# Patient Record
Sex: Female | Born: 1935 | Race: White | Hispanic: No | Marital: Single | Smoking: Former smoker
Health system: Southern US, Community
[De-identification: ages and names within clinical notes are randomized; demographics above are authoritative.]

## PROBLEM LIST (undated history)

## (undated) DIAGNOSIS — I10 Essential (primary) hypertension: Secondary | ICD-10-CM

## (undated) DIAGNOSIS — M25552 Pain in left hip: Secondary | ICD-10-CM

## (undated) DIAGNOSIS — M1612 Unilateral primary osteoarthritis, left hip: Secondary | ICD-10-CM

## (undated) DIAGNOSIS — K219 Gastro-esophageal reflux disease without esophagitis: Secondary | ICD-10-CM

## (undated) DIAGNOSIS — M545 Low back pain, unspecified: Secondary | ICD-10-CM

## (undated) DIAGNOSIS — Z9289 Personal history of other medical treatment: Secondary | ICD-10-CM

## (undated) DIAGNOSIS — T84059A Periprosthetic osteolysis of unspecified internal prosthetic joint, initial encounter: Secondary | ICD-10-CM

## (undated) DIAGNOSIS — M1990 Unspecified osteoarthritis, unspecified site: Secondary | ICD-10-CM

## (undated) DIAGNOSIS — M7061 Trochanteric bursitis, right hip: Secondary | ICD-10-CM

## (undated) DIAGNOSIS — S7002XA Contusion of left hip, initial encounter: Secondary | ICD-10-CM

## (undated) DIAGNOSIS — Z4789 Encounter for other orthopedic aftercare: Secondary | ICD-10-CM

## (undated) DIAGNOSIS — Z96643 Presence of artificial hip joint, bilateral: Secondary | ICD-10-CM

## (undated) HISTORY — DX: Trochanteric bursitis, right hip: M70.61

## (undated) HISTORY — PX: JOINT REPLACEMENT: SHX530

## (undated) HISTORY — DX: Presence of artificial hip joint, bilateral: Z96.643

## (undated) HISTORY — DX: Pain in left hip: M25.552

## (undated) HISTORY — DX: Low back pain, unspecified: M54.50

## (undated) HISTORY — DX: Low back pain: M54.5

## (undated) HISTORY — PX: ABDOMINAL HYSTERECTOMY: SHX81

## (undated) HISTORY — DX: Unilateral primary osteoarthritis, left hip: M16.12

## (undated) HISTORY — DX: Encounter for other orthopedic aftercare: Z47.89

## (undated) HISTORY — DX: Contusion of left hip, initial encounter: S70.02XA

## (undated) HISTORY — DX: Periprosthetic osteolysis of unspecified internal prosthetic joint, initial encounter: T84.059A

---

## 1975-08-23 HISTORY — PX: OTHER SURGICAL HISTORY: SHX169

## 2006-08-22 HISTORY — PX: OTHER SURGICAL HISTORY: SHX169

## 2012-08-27 DIAGNOSIS — R300 Dysuria: Secondary | ICD-10-CM | POA: Diagnosis not present

## 2012-08-27 DIAGNOSIS — N390 Urinary tract infection, site not specified: Secondary | ICD-10-CM | POA: Diagnosis not present

## 2012-08-28 DIAGNOSIS — M6281 Muscle weakness (generalized): Secondary | ICD-10-CM | POA: Diagnosis not present

## 2012-08-28 DIAGNOSIS — I495 Sick sinus syndrome: Secondary | ICD-10-CM | POA: Diagnosis not present

## 2012-08-28 DIAGNOSIS — N390 Urinary tract infection, site not specified: Secondary | ICD-10-CM | POA: Diagnosis not present

## 2012-08-28 DIAGNOSIS — R5383 Other fatigue: Secondary | ICD-10-CM | POA: Diagnosis not present

## 2012-08-28 DIAGNOSIS — K5900 Constipation, unspecified: Secondary | ICD-10-CM | POA: Diagnosis not present

## 2012-08-28 DIAGNOSIS — Z96649 Presence of unspecified artificial hip joint: Secondary | ICD-10-CM | POA: Diagnosis not present

## 2012-08-28 DIAGNOSIS — R55 Syncope and collapse: Secondary | ICD-10-CM | POA: Diagnosis not present

## 2012-08-28 DIAGNOSIS — Z96659 Presence of unspecified artificial knee joint: Secondary | ICD-10-CM | POA: Diagnosis not present

## 2012-08-28 DIAGNOSIS — R5381 Other malaise: Secondary | ICD-10-CM | POA: Diagnosis not present

## 2012-08-28 DIAGNOSIS — N3000 Acute cystitis without hematuria: Secondary | ICD-10-CM | POA: Diagnosis not present

## 2012-08-28 DIAGNOSIS — R51 Headache: Secondary | ICD-10-CM | POA: Diagnosis not present

## 2012-08-28 DIAGNOSIS — Z9181 History of falling: Secondary | ICD-10-CM | POA: Diagnosis not present

## 2012-08-28 DIAGNOSIS — M549 Dorsalgia, unspecified: Secondary | ICD-10-CM | POA: Diagnosis not present

## 2012-08-28 DIAGNOSIS — Z87891 Personal history of nicotine dependence: Secondary | ICD-10-CM | POA: Diagnosis not present

## 2012-08-28 DIAGNOSIS — J392 Other diseases of pharynx: Secondary | ICD-10-CM | POA: Diagnosis not present

## 2012-08-29 DIAGNOSIS — Z87891 Personal history of nicotine dependence: Secondary | ICD-10-CM | POA: Diagnosis not present

## 2012-08-29 DIAGNOSIS — Z96659 Presence of unspecified artificial knee joint: Secondary | ICD-10-CM | POA: Diagnosis not present

## 2012-08-29 DIAGNOSIS — Z9181 History of falling: Secondary | ICD-10-CM | POA: Diagnosis not present

## 2012-08-29 DIAGNOSIS — N3000 Acute cystitis without hematuria: Secondary | ICD-10-CM | POA: Diagnosis not present

## 2012-08-29 DIAGNOSIS — R5383 Other fatigue: Secondary | ICD-10-CM | POA: Diagnosis not present

## 2012-08-29 DIAGNOSIS — K5900 Constipation, unspecified: Secondary | ICD-10-CM | POA: Diagnosis not present

## 2012-09-10 DIAGNOSIS — R350 Frequency of micturition: Secondary | ICD-10-CM | POA: Diagnosis not present

## 2012-09-10 DIAGNOSIS — N3000 Acute cystitis without hematuria: Secondary | ICD-10-CM | POA: Diagnosis not present

## 2012-12-13 DIAGNOSIS — Z79899 Other long term (current) drug therapy: Secondary | ICD-10-CM | POA: Diagnosis not present

## 2012-12-13 DIAGNOSIS — R1032 Left lower quadrant pain: Secondary | ICD-10-CM | POA: Diagnosis not present

## 2012-12-13 DIAGNOSIS — M545 Low back pain: Secondary | ICD-10-CM | POA: Diagnosis not present

## 2012-12-13 DIAGNOSIS — M1710 Unilateral primary osteoarthritis, unspecified knee: Secondary | ICD-10-CM | POA: Diagnosis not present

## 2012-12-13 DIAGNOSIS — R42 Dizziness and giddiness: Secondary | ICD-10-CM | POA: Diagnosis not present

## 2013-02-12 DIAGNOSIS — M545 Low back pain: Secondary | ICD-10-CM | POA: Diagnosis not present

## 2013-02-12 DIAGNOSIS — M999 Biomechanical lesion, unspecified: Secondary | ICD-10-CM | POA: Diagnosis not present

## 2013-02-13 DIAGNOSIS — M999 Biomechanical lesion, unspecified: Secondary | ICD-10-CM | POA: Diagnosis not present

## 2013-02-13 DIAGNOSIS — M545 Low back pain: Secondary | ICD-10-CM | POA: Diagnosis not present

## 2013-02-18 DIAGNOSIS — M545 Low back pain: Secondary | ICD-10-CM | POA: Diagnosis not present

## 2013-02-18 DIAGNOSIS — M999 Biomechanical lesion, unspecified: Secondary | ICD-10-CM | POA: Diagnosis not present

## 2013-02-25 DIAGNOSIS — M545 Low back pain: Secondary | ICD-10-CM | POA: Diagnosis not present

## 2013-02-25 DIAGNOSIS — M999 Biomechanical lesion, unspecified: Secondary | ICD-10-CM | POA: Diagnosis not present

## 2013-02-27 DIAGNOSIS — M999 Biomechanical lesion, unspecified: Secondary | ICD-10-CM | POA: Diagnosis not present

## 2013-02-27 DIAGNOSIS — M545 Low back pain: Secondary | ICD-10-CM | POA: Diagnosis not present

## 2013-03-06 DIAGNOSIS — M545 Low back pain: Secondary | ICD-10-CM | POA: Diagnosis not present

## 2013-03-06 DIAGNOSIS — M999 Biomechanical lesion, unspecified: Secondary | ICD-10-CM | POA: Diagnosis not present

## 2013-03-08 DIAGNOSIS — M1610 Unilateral primary osteoarthritis, unspecified hip: Secondary | ICD-10-CM | POA: Diagnosis not present

## 2013-03-25 DIAGNOSIS — M169 Osteoarthritis of hip, unspecified: Secondary | ICD-10-CM | POA: Diagnosis not present

## 2013-03-25 DIAGNOSIS — M1710 Unilateral primary osteoarthritis, unspecified knee: Secondary | ICD-10-CM | POA: Diagnosis not present

## 2013-03-25 DIAGNOSIS — Z01812 Encounter for preprocedural laboratory examination: Secondary | ICD-10-CM | POA: Diagnosis not present

## 2013-04-01 DIAGNOSIS — R1909 Other intra-abdominal and pelvic swelling, mass and lump: Secondary | ICD-10-CM | POA: Diagnosis not present

## 2013-04-01 DIAGNOSIS — M899 Disorder of bone, unspecified: Secondary | ICD-10-CM | POA: Diagnosis not present

## 2013-04-01 DIAGNOSIS — M949 Disorder of cartilage, unspecified: Secondary | ICD-10-CM | POA: Diagnosis not present

## 2013-04-01 DIAGNOSIS — M169 Osteoarthritis of hip, unspecified: Secondary | ICD-10-CM | POA: Diagnosis not present

## 2013-04-01 DIAGNOSIS — Z96649 Presence of unspecified artificial hip joint: Secondary | ICD-10-CM | POA: Diagnosis not present

## 2013-04-04 DIAGNOSIS — M1610 Unilateral primary osteoarthritis, unspecified hip: Secondary | ICD-10-CM | POA: Diagnosis not present

## 2013-04-04 DIAGNOSIS — M8560 Other cyst of bone, unspecified site: Secondary | ICD-10-CM | POA: Diagnosis not present

## 2013-04-04 DIAGNOSIS — T84099A Other mechanical complication of unspecified internal joint prosthesis, initial encounter: Secondary | ICD-10-CM | POA: Diagnosis not present

## 2013-04-25 DIAGNOSIS — M1710 Unilateral primary osteoarthritis, unspecified knee: Secondary | ICD-10-CM | POA: Diagnosis not present

## 2013-04-30 DIAGNOSIS — H6120 Impacted cerumen, unspecified ear: Secondary | ICD-10-CM | POA: Diagnosis not present

## 2013-04-30 DIAGNOSIS — M1710 Unilateral primary osteoarthritis, unspecified knee: Secondary | ICD-10-CM | POA: Diagnosis not present

## 2013-04-30 DIAGNOSIS — J3089 Other allergic rhinitis: Secondary | ICD-10-CM | POA: Diagnosis not present

## 2013-05-24 ENCOUNTER — Encounter (HOSPITAL_COMMUNITY): Payer: Self-pay | Admitting: Pharmacy Technician

## 2013-05-29 ENCOUNTER — Encounter (HOSPITAL_COMMUNITY): Payer: Self-pay

## 2013-05-29 ENCOUNTER — Encounter (HOSPITAL_COMMUNITY)
Admission: RE | Admit: 2013-05-29 | Discharge: 2013-05-29 | Disposition: A | Discharge status: Home / Self Care | Payer: Medicare Other | Source: Ambulatory Visit | Attending: Orthopedic Surgery | Admitting: Orthopedic Surgery

## 2013-05-29 ENCOUNTER — Other Ambulatory Visit (HOSPITAL_COMMUNITY): Payer: Self-pay | Admitting: Orthopedic Surgery

## 2013-05-29 DIAGNOSIS — Z01812 Encounter for preprocedural laboratory examination: Secondary | ICD-10-CM | POA: Diagnosis not present

## 2013-05-29 HISTORY — DX: Unspecified osteoarthritis, unspecified site: M19.90

## 2013-05-29 HISTORY — DX: Gastro-esophageal reflux disease without esophagitis: K21.9

## 2013-05-29 HISTORY — DX: Personal history of other medical treatment: Z92.89

## 2013-05-29 LAB — URINE MICROSCOPIC-ADD ON

## 2013-05-29 LAB — CBC
HCT: 36.6 % (ref 36.0–46.0)
MCHC: 33.1 g/dL (ref 30.0–36.0)
Platelets: 289 10*3/uL (ref 150–400)
RDW: 13.7 % (ref 11.5–15.5)
WBC: 6.7 10*3/uL (ref 4.0–10.5)

## 2013-05-29 LAB — PROTIME-INR: INR: 0.97 (ref 0.00–1.49)

## 2013-05-29 LAB — URINALYSIS, ROUTINE W REFLEX MICROSCOPIC
Glucose, UA: NEGATIVE mg/dL
Hgb urine dipstick: NEGATIVE
Ketones, ur: NEGATIVE mg/dL
Protein, ur: NEGATIVE mg/dL
pH: 5 (ref 5.0–8.0)

## 2013-05-29 LAB — BASIC METABOLIC PANEL
BUN: 20 mg/dL (ref 6–23)
Calcium: 9.2 mg/dL (ref 8.4–10.5)
Creatinine, Ser: 0.74 mg/dL (ref 0.50–1.10)
GFR calc Af Amer: 87 mL/min — ABNORMAL LOW (ref >90)

## 2013-05-29 LAB — ABO/RH: ABO/RH(D): B POS

## 2013-05-29 LAB — SURGICAL PCR SCREEN: MRSA, PCR: NEGATIVE

## 2013-05-29 LAB — APTT: aPTT: 29 seconds (ref 24–37)

## 2013-05-29 NOTE — Progress Notes (Addendum)
Medical clearance note dr Lanora Manis dewey on chart

## 2013-05-29 NOTE — Patient Instructions (Addendum)
20 Marguerita Stapp  05/29/2013   Your procedure is scheduled on: 06-04-2013  Report to [facility] at 1050  AM.  Call this number if you have problems the morning of surgery [phone]   Remember:   Do not eat food  :After Midnight.              Clear liquids midnight until 750 am day of surgery, then nothing by mouth.                  Take these medicines the morning of surgery with A SIP OF WATER: no meds to take                                SEE [facility] PREPARING FOR SURGERY SHEET             You may not have any metal on your body including hair pins and piercings  Do not wear jewelry, make-up.  Do not wear lotions, powders, or perfumes. You may wear deodorant.   Men may shave face and neck.  Do not bring valuables to the hospital.  IS NOT RESPONSIBLE FOR VALUEABLES.  Contacts, dentures or bridgework may not be worn into surgery.  Leave suitcase in the car. After surgery it may be brought to your room.  For patients admitted to the hospital, checkout time is 11:00 AM the day of discharge.   Patients discharged the day of surgery will not be allowed to drive home.  Name and phone number of your driver:  Special Instructions: N/A   Please read over the following fact sheets that you were given: mrsa information, blood fact sheet, incentive spirometer fact sheet  Call Cain Sieve RN pre op nurse if needed [phone]    FAILURE TO FOLLOW THESE INSTRUCTIONS MAY RESULT IN THE CANCELLATION OF YOUR SURGERY.  PATIENT SIGNATURE___________________________________________  NURSE SIGNATURE_____________________________________________

## 2013-06-01 NOTE — H&P (Signed)
TOTAL HIP ADMISSION H&P  Patient is admitted for left total hip arthroplasty, anterior approach.  Subjective:  Chief Complaint:   Left hip OA / pain  HPI: Laura Knight, 79 y.o. female, has a history of pain and functional disability in the left hip(s) due to arthritis and patient has failed non-surgical conservative treatments for greater than 12 weeks to include NSAID's and/or analgesics, use of assistive devices and activity modification.  Onset of symptoms was gradual starting 2+ years ago with gradually worsening course since that time.The patient noted prior procedures of the hip to include arthroplasty on the right hip(s).  Patient currently rates pain in the left hip at 8 out of 10 with activity. Patient has night pain, worsening of pain with activity and weight bearing, trendelenberg gait, pain that interfers with activities of daily living and pain with passive range of motion. Patient has evidence of periarticular osteophytes and joint space narrowing by imaging studies. This condition presents safety issues increasing the risk of falls.  There is no current active infection.  Risks, benefits and expectations were discussed with the patient. Patient understand the risks, benefits and expectations and wishes to proceed with surgery.   D/C Plans:   SNF ([facility])  Post-op Meds:   No Rx given  Tranexamic Acid:   To be given  Decadron:    To be given  FYI:    ASA post-op   Past Medical History  Diagnosis Date  . GERD (gastroesophageal reflux disease)   . Arthritis   . History of blood transfusion yrs ago    Past Surgical History  Procedure Laterality Date  . Abdominal hysterectomy      1 ovary removed  . Joint replacement    . Right hip replacement  1977  . Bilateral knee replacment  2008    No prescriptions prior to admission   No Known Allergies   History  Substance Use Topics  . Smoking status: Former Smoker -- 0.50 packs/day for 12 years    Types: Cigarettes  .  Smokeless tobacco: Never Used     Comment: quit smoking age 75  . Alcohol Use: Yes     Comment: occasional wine    No family history on file.   Review of Systems  Constitutional: Negative.   HENT: Negative.   Eyes: Negative.   Respiratory: Positive for cough.   Cardiovascular: Negative.   Gastrointestinal: Positive for heartburn.  Genitourinary: Negative.   Musculoskeletal: Positive for back pain and joint pain.  Skin: Negative.   Neurological: Negative.   Endo/Heme/Allergies: Positive for environmental allergies.  Psychiatric/Behavioral: Negative.     Objective:  Physical Exam  Constitutional: She is oriented to person, place, and time. She appears well-developed and well-nourished.  HENT:  Head: Normocephalic and atraumatic.  Mouth/Throat: Oropharynx is clear and moist.  Eyes: Pupils are equal, round, and reactive to light.  Neck: Neck supple. No JVD present. No tracheal deviation present. No thyromegaly present.  Cardiovascular: Normal rate, regular rhythm, normal heart sounds and intact distal pulses.   Respiratory: Effort normal and breath sounds normal. No stridor. No respiratory distress. She has no wheezes.  GI: Soft. There is no tenderness. There is no guarding.  Musculoskeletal:       Left hip: She exhibits decreased range of motion, decreased strength, tenderness and bony tenderness. She exhibits no swelling, no deformity and no laceration.  Lymphadenopathy:    She has no cervical adenopathy.  Neurological: She is alert and oriented to person, place, and  time.  Skin: Skin is warm and dry.  Psychiatric: She has a normal mood and affect.    Imaging Review Plain radiographs demonstrate severe degenerative joint disease of the left hip(s). The bone quality appears to be good for age and reported activity level.  Assessment/Plan:  End stage arthritis, left hip(s)  The patient history, physical examination, clinical judgement of the provider and imaging studies  are consistent with end stage degenerative joint disease of the left hip(s) and total hip arthroplasty is deemed medically necessary. The treatment options including medical management, injection therapy, arthroscopy and arthroplasty were discussed at length. The risks and benefits of total hip arthroplasty were presented and reviewed. The risks due to aseptic loosening, infection, stiffness, dislocation/subluxation,  thromboembolic complications and other imponderables were discussed.  The patient acknowledged the explanation, agreed to proceed with the plan and consent was signed. Patient is being admitted for inpatient treatment for surgery, pain control, PT, OT, prophylactic antibiotics, VTE prophylaxis, progressive ambulation and ADL's and discharge planning.The patient is planning to be discharged to skilled nursing facility.      Anastasio Auerbach Ketan Renz   PAC  06/01/2013, 12:54 PM

## 2013-06-03 NOTE — Progress Notes (Signed)
SPOKE WITH GARY Hughson PT SON BY PHONE, MADE AWARE SURGERY TIME CHANGED TO 1225 P,, ARRIVE 925 AM, NPO AFTER MIDNIGHT.

## 2013-06-04 ENCOUNTER — Inpatient Hospital Stay (HOSPITAL_COMMUNITY)
Admission: RE | Admit: 2013-06-04 | Discharge: 2013-06-07 | DRG: 470 | Disposition: A | Discharge status: Skilled Nursing Facility | Payer: Medicare Other | Source: Ambulatory Visit | Attending: Orthopedic Surgery | Admitting: Orthopedic Surgery

## 2013-06-04 ENCOUNTER — Inpatient Hospital Stay (HOSPITAL_COMMUNITY): Payer: Medicare Other

## 2013-06-04 ENCOUNTER — Inpatient Hospital Stay (HOSPITAL_COMMUNITY): Payer: Medicare Other | Admitting: Anesthesiology

## 2013-06-04 ENCOUNTER — Encounter (HOSPITAL_COMMUNITY)
Admission: RE | Disposition: A | Discharge status: Skilled Nursing Facility | Payer: Self-pay | Source: Ambulatory Visit | Attending: Orthopedic Surgery

## 2013-06-04 ENCOUNTER — Encounter (HOSPITAL_COMMUNITY): Payer: Self-pay | Admitting: *Deleted

## 2013-06-04 ENCOUNTER — Encounter (HOSPITAL_COMMUNITY): Payer: Medicare Other | Admitting: Anesthesiology

## 2013-06-04 DIAGNOSIS — Z87891 Personal history of nicotine dependence: Secondary | ICD-10-CM

## 2013-06-04 DIAGNOSIS — M6281 Muscle weakness (generalized): Secondary | ICD-10-CM | POA: Diagnosis not present

## 2013-06-04 DIAGNOSIS — R279 Unspecified lack of coordination: Secondary | ICD-10-CM | POA: Diagnosis not present

## 2013-06-04 DIAGNOSIS — K219 Gastro-esophageal reflux disease without esophagitis: Secondary | ICD-10-CM | POA: Diagnosis not present

## 2013-06-04 DIAGNOSIS — M1610 Unilateral primary osteoarthritis, unspecified hip: Principal | ICD-10-CM | POA: Diagnosis present

## 2013-06-04 DIAGNOSIS — Z471 Aftercare following joint replacement surgery: Secondary | ICD-10-CM | POA: Diagnosis not present

## 2013-06-04 DIAGNOSIS — M169 Osteoarthritis of hip, unspecified: Secondary | ICD-10-CM | POA: Diagnosis present

## 2013-06-04 DIAGNOSIS — Z96659 Presence of unspecified artificial knee joint: Secondary | ICD-10-CM

## 2013-06-04 DIAGNOSIS — D62 Acute posthemorrhagic anemia: Secondary | ICD-10-CM | POA: Diagnosis present

## 2013-06-04 DIAGNOSIS — Z96649 Presence of unspecified artificial hip joint: Secondary | ICD-10-CM | POA: Diagnosis not present

## 2013-06-04 DIAGNOSIS — R269 Unspecified abnormalities of gait and mobility: Secondary | ICD-10-CM | POA: Diagnosis not present

## 2013-06-04 DIAGNOSIS — M1990 Unspecified osteoarthritis, unspecified site: Secondary | ICD-10-CM | POA: Diagnosis not present

## 2013-06-04 DIAGNOSIS — D500 Iron deficiency anemia secondary to blood loss (chronic): Secondary | ICD-10-CM | POA: Diagnosis not present

## 2013-06-04 HISTORY — PX: TOTAL HIP ARTHROPLASTY: SHX124

## 2013-06-04 LAB — TYPE AND SCREEN: ABO/RH(D): B POS

## 2013-06-04 SURGERY — ARTHROPLASTY, HIP, TOTAL, ANTERIOR APPROACH
Anesthesia: Spinal | Site: Hip | Laterality: Left | Wound class: Clean

## 2013-06-04 MED ORDER — PHENOL 1.4 % MT LIQD: 1.0000 | OROMUCOSAL | Status: DC | PRN

## 2013-06-04 MED ORDER — LACTATED RINGERS IV SOLN: INTRAVENOUS | Status: DC

## 2013-06-04 MED ORDER — ALUM & MAG HYDROXIDE-SIMETH 200-200-20 MG/5ML PO SUSP
30.0000 mL | ORAL | Status: DC | PRN
  Administered 2013-06-07: 30 mL via ORAL
  Filled 2013-06-04: qty 30

## 2013-06-04 MED ORDER — SENNA 8.6 MG PO TABS
1.0000 | ORAL_TABLET | Freq: Two times a day (BID) | ORAL | Status: DC
  Administered 2013-06-04 – 2013-06-06 (×5): 8.6 mg via ORAL
  Filled 2013-06-04 (×5): qty 1

## 2013-06-04 MED ORDER — CEFAZOLIN SODIUM-DEXTROSE 2-3 GM-% IV SOLR
INTRAVENOUS | Status: AC
  Filled 2013-06-04: qty 50

## 2013-06-04 MED ORDER — ONDANSETRON HCL 4 MG/2ML IJ SOLN
INTRAMUSCULAR | Status: DC | PRN
  Administered 2013-06-04: 4 mg via INTRAMUSCULAR

## 2013-06-04 MED ORDER — POLYETHYLENE GLYCOL 3350 17 G PO PACK: 17.0000 g | PACK | Freq: Every day | ORAL | Status: DC | PRN

## 2013-06-04 MED ORDER — LACTATED RINGERS IV SOLN
INTRAVENOUS | Status: DC | PRN
  Administered 2013-06-04 (×2): via INTRAVENOUS

## 2013-06-04 MED ORDER — DEXAMETHASONE SODIUM PHOSPHATE 10 MG/ML IJ SOLN
10.0000 mg | Freq: Once | INTRAMUSCULAR | Status: DC
  Filled 2013-06-04: qty 1

## 2013-06-04 MED ORDER — METHOCARBAMOL 500 MG PO TABS
500.0000 mg | ORAL_TABLET | Freq: Four times a day (QID) | ORAL | Status: DC | PRN
  Administered 2013-06-04 – 2013-06-05 (×2): 500 mg via ORAL
  Filled 2013-06-04 (×2): qty 1

## 2013-06-04 MED ORDER — ONDANSETRON HCL 4 MG PO TABS: 4.0000 mg | ORAL_TABLET | Freq: Four times a day (QID) | ORAL | Status: DC | PRN

## 2013-06-04 MED ORDER — ONDANSETRON HCL 4 MG/2ML IJ SOLN: 4.0000 mg | Freq: Four times a day (QID) | INTRAMUSCULAR | Status: DC | PRN

## 2013-06-04 MED ORDER — BUPIVACAINE HCL (PF) 0.5 % IJ SOLN
INTRAMUSCULAR | Status: DC | PRN
  Administered 2013-06-04: 15 mL

## 2013-06-04 MED ORDER — ASPIRIN EC 325 MG PO TBEC
325.0000 mg | DELAYED_RELEASE_TABLET | Freq: Two times a day (BID) | ORAL | Status: DC
  Administered 2013-06-05 – 2013-06-07 (×5): 325 mg via ORAL
  Filled 2013-06-04 (×7): qty 1

## 2013-06-04 MED ORDER — POTASSIUM CHLORIDE 2 MEQ/ML IV SOLN
INTRAVENOUS | Status: DC
  Administered 2013-06-05: via INTRAVENOUS
  Filled 2013-06-04 (×5): qty 1000

## 2013-06-04 MED ORDER — SODIUM CHLORIDE 0.9 % IV SOLN
1000.0000 mg | Freq: Once | INTRAVENOUS | Status: DC
  Filled 2013-06-04: qty 10

## 2013-06-04 MED ORDER — PROPOFOL INFUSION 10 MG/ML OPTIME
INTRAVENOUS | Status: DC | PRN
  Administered 2013-06-04: 75 ug/kg/min via INTRAVENOUS

## 2013-06-04 MED ORDER — HYDROCODONE-ACETAMINOPHEN 7.5-325 MG PO TABS
1.0000 | ORAL_TABLET | ORAL | Status: DC
  Administered 2013-06-04: 19:00:00 1 via ORAL
  Administered 2013-06-04 – 2013-06-05 (×5): 2 via ORAL
  Administered 2013-06-05 – 2013-06-07 (×10): 1 via ORAL
  Administered 2013-06-07: 11:00:00 2 via ORAL
  Filled 2013-06-04: qty 2
  Filled 2013-06-04: qty 1
  Filled 2013-06-04: qty 2
  Filled 2013-06-04 (×4): qty 1
  Filled 2013-06-04: qty 2
  Filled 2013-06-04: qty 1
  Filled 2013-06-04: qty 2
  Filled 2013-06-04 (×2): qty 1
  Filled 2013-06-04: qty 2
  Filled 2013-06-04: qty 1
  Filled 2013-06-04: qty 2
  Filled 2013-06-04 (×2): qty 1

## 2013-06-04 MED ORDER — HYDROMORPHONE HCL PF 1 MG/ML IJ SOLN: 0.2500 mg | INTRAMUSCULAR | Status: DC | PRN

## 2013-06-04 MED ORDER — CEFAZOLIN SODIUM-DEXTROSE 2-3 GM-% IV SOLR
2.0000 g | INTRAVENOUS | Status: AC
  Administered 2013-06-04: 2 g via INTRAVENOUS

## 2013-06-04 MED ORDER — BUPIVACAINE HCL (PF) 0.5 % IJ SOLN
INTRAMUSCULAR | Status: AC
  Filled 2013-06-04: qty 30

## 2013-06-04 MED ORDER — DEXAMETHASONE SODIUM PHOSPHATE 10 MG/ML IJ SOLN
INTRAMUSCULAR | Status: DC | PRN
  Administered 2013-06-04: 10 mg via INTRAVENOUS

## 2013-06-04 MED ORDER — SODIUM CHLORIDE 0.9 % IR SOLN
Status: DC | PRN
  Administered 2013-06-04: 1000 mL

## 2013-06-04 MED ORDER — METHOCARBAMOL 100 MG/ML IJ SOLN
500.0000 mg | Freq: Four times a day (QID) | INTRAMUSCULAR | Status: DC | PRN
  Filled 2013-06-04: qty 5

## 2013-06-04 MED ORDER — HYDROMORPHONE HCL PF 1 MG/ML IJ SOLN
0.5000 mg | INTRAMUSCULAR | Status: DC | PRN
  Administered 2013-06-05: 1 mg via INTRAVENOUS
  Filled 2013-06-04: qty 1

## 2013-06-04 MED ORDER — DOCUSATE SODIUM 100 MG PO CAPS
100.0000 mg | ORAL_CAPSULE | Freq: Two times a day (BID) | ORAL | Status: DC
  Administered 2013-06-04 – 2013-06-07 (×6): 100 mg via ORAL

## 2013-06-04 MED ORDER — CEFAZOLIN SODIUM 1-5 GM-% IV SOLN
1.0000 g | Freq: Four times a day (QID) | INTRAVENOUS | Status: AC
  Administered 2013-06-04 – 2013-06-05 (×2): 1 g via INTRAVENOUS
  Filled 2013-06-04 (×2): qty 50

## 2013-06-04 MED ORDER — FERROUS SULFATE 325 (65 FE) MG PO TABS
325.0000 mg | ORAL_TABLET | Freq: Three times a day (TID) | ORAL | Status: DC
  Administered 2013-06-04 – 2013-06-07 (×9): 325 mg via ORAL
  Filled 2013-06-04 (×11): qty 1

## 2013-06-04 MED ORDER — MENTHOL 3 MG MT LOZG: 1.0000 | LOZENGE | OROMUCOSAL | Status: DC | PRN

## 2013-06-04 MED ORDER — DIPHENHYDRAMINE HCL 12.5 MG/5ML PO ELIX: 25.0000 mg | ORAL_SOLUTION | Freq: Four times a day (QID) | ORAL | Status: DC | PRN

## 2013-06-04 MED ORDER — ZOLPIDEM TARTRATE 5 MG PO TABS: 5.0000 mg | ORAL_TABLET | Freq: Every evening | ORAL | Status: DC | PRN

## 2013-06-04 MED ORDER — PHENYLEPHRINE HCL 10 MG/ML IJ SOLN
INTRAMUSCULAR | Status: DC | PRN
  Administered 2013-06-04 (×2): 40 ug via INTRAVENOUS

## 2013-06-04 MED ORDER — DEXAMETHASONE SODIUM PHOSPHATE 10 MG/ML IJ SOLN: 10.0000 mg | Freq: Once | INTRAMUSCULAR | Status: DC

## 2013-06-04 MED ORDER — MIDAZOLAM HCL 5 MG/5ML IJ SOLN
INTRAMUSCULAR | Status: DC | PRN
  Administered 2013-06-04 (×2): 1 mg via INTRAVENOUS

## 2013-06-04 SURGICAL SUPPLY — 38 items
BAG ZIPLOCK 12X15 (MISCELLANEOUS) ×4 IMPLANT
BLADE SAW SGTL 18X1.27X75 (BLADE) ×2 IMPLANT
CAPT HIP PF MOP ×2 IMPLANT
CLOTH BEACON ORANGE TIMEOUT ST (SAFETY) ×2 IMPLANT
DERMABOND ADVANCED (GAUZE/BANDAGES/DRESSINGS) ×1
DERMABOND ADVANCED .7 DNX12 (GAUZE/BANDAGES/DRESSINGS) ×1 IMPLANT
DRAPE C-ARM 42X120 X-RAY (DRAPES) ×2 IMPLANT
DRAPE STERI IOBAN 125X83 (DRAPES) ×2 IMPLANT
DRAPE U-SHAPE 47X51 STRL (DRAPES) ×6 IMPLANT
DRSG AQUACEL AG ADV 3.5X10 (GAUZE/BANDAGES/DRESSINGS) ×2 IMPLANT
DRSG TEGADERM 4X4.75 (GAUZE/BANDAGES/DRESSINGS) IMPLANT
DURAPREP 26ML APPLICATOR (WOUND CARE) ×2 IMPLANT
ELECT BLADE TIP CTD 4 INCH (ELECTRODE) ×2 IMPLANT
ELECT REM PT RETURN 9FT ADLT (ELECTROSURGICAL) ×2
ELECTRODE REM PT RTRN 9FT ADLT (ELECTROSURGICAL) ×1 IMPLANT
EVACUATOR 1/8 PVC DRAIN (DRAIN) IMPLANT
FACESHIELD LNG OPTICON STERILE (SAFETY) ×8 IMPLANT
GAUZE SPONGE 2X2 8PLY STRL LF (GAUZE/BANDAGES/DRESSINGS) ×1 IMPLANT
GLOVE BIOGEL PI IND STRL 7.5 (GLOVE) ×1 IMPLANT
GLOVE BIOGEL PI IND STRL 8 (GLOVE) ×1 IMPLANT
GLOVE BIOGEL PI INDICATOR 7.5 (GLOVE) ×1
GLOVE BIOGEL PI INDICATOR 8 (GLOVE) ×1
GLOVE ECLIPSE 8.0 STRL XLNG CF (GLOVE) ×2 IMPLANT
GLOVE ORTHO TXT STRL SZ7.5 (GLOVE) ×4 IMPLANT
GOWN BRE IMP PREV XXLGXLNG (GOWN DISPOSABLE) ×2 IMPLANT
GOWN PREVENTION PLUS LG XLONG (DISPOSABLE) ×2 IMPLANT
KIT BASIN OR (CUSTOM PROCEDURE TRAY) ×2 IMPLANT
PACK TOTAL JOINT (CUSTOM PROCEDURE TRAY) ×2 IMPLANT
PADDING CAST COTTON 6X4 STRL (CAST SUPPLIES) ×2 IMPLANT
SPONGE GAUZE 2X2 STER 10/PKG (GAUZE/BANDAGES/DRESSINGS) ×1
SUCTION FRAZIER 12FR DISP (SUCTIONS) ×2 IMPLANT
SUT MNCRL AB 4-0 PS2 18 (SUTURE) ×2 IMPLANT
SUT VIC AB 1 CT1 36 (SUTURE) ×8 IMPLANT
SUT VIC AB 2-0 CT1 27 (SUTURE) ×2
SUT VIC AB 2-0 CT1 TAPERPNT 27 (SUTURE) ×2 IMPLANT
SUT VLOC 180 0 24IN GS25 (SUTURE) IMPLANT
TOWEL OR 17X26 10 PK STRL BLUE (TOWEL DISPOSABLE) ×4 IMPLANT
TRAY FOLEY CATH 14FRSI W/METER (CATHETERS) ×2 IMPLANT

## 2013-06-04 NOTE — Plan of Care (Signed)
Problem: Consults Goal: Diagnosis- Total Joint Replacement Primary Total Hip     

## 2013-06-04 NOTE — Op Note (Signed)
NAME:  Laura Knight                ACCOUNT NO.: [id number]      MEDICAL RECORD NO.: [id number]      FACILITY:  [facility]      PHYSICIAN:  Durene Romans D  DATE OF BIRTH:  1925-01-17     DATE OF PROCEDURE:  06/04/2013                                 OPERATIVE REPORT         PREOPERATIVE DIAGNOSIS: Left  hip osteoarthritis.      POSTOPERATIVE DIAGNOSIS:  Left hip osteoarthritis.      PROCEDURE:  Left total hip replacement through an anterior approach   utilizing DePuy THR system, component size 52mm pinnacle cup, a size 36+4 neutral   Altrex liner, a size 6 Hi Tri Lock stem with a 36+1.5 Articuleze metal ball.      SURGEON:  Madlyn Frankel. Charlann Boxer, M.D.      ASSISTANT:  Leilani Able, PA-C      ANESTHESIA:  Spinal.      SPECIMENS:  None.      COMPLICATIONS:  None.      BLOOD LOSS:  250 cc     DRAINS:  One Hemovac.      INDICATION OF THE PROCEDURE:  Laura Knight is a 79 y.o. female who had   presented to office for evaluation of left hip pain.  Radiographs revealed   progressive degenerative changes with bone-on-bone   articulation to the  hip joint.  The patient had painful limited range of   motion significantly affecting their overall quality of life.  The patient was failing to    respond to conservative measures, and at this point was ready   to proceed with more definitive measures.  The patient has noted progressive   degenerative changes in his hip, progressive problems and dysfunction   with regarding the hip prior to surgery.  Consent was obtained for   benefit of pain relief.  Specific risk of infection, DVT, component   failure, dislocation, need for revision surgery, as well discussion of   the anterior versus posterior approach were reviewed.  Consent was   obtained for benefit of anterior pain relief through an anterior   approach.      PROCEDURE IN DETAIL:  The patient was brought to operative theater.   Once adequate anesthesia,  preoperative antibiotics, 2gm Ancef administered.   The patient was positioned supine on the OSI Hanna table.  Once adequate   padding of boney process was carried out, we had predraped out the hip, and  used fluoroscopy to confirm orientation of the pelvis and position.      The left hip was then prepped and draped from proximal iliac crest to   mid thigh with shower curtain technique.      Time-out was performed identifying the patient, planned procedure, and   extremity.     An incision was then made 2 cm distal and lateral to the   anterior superior iliac spine extending over the orientation of the   tensor fascia lata muscle and sharp dissection was carried down to the   fascia of the muscle and protractor placed in the soft tissues.      The fascia was then incised.  The muscle belly was identified and swept   laterally  and retractor placed along the superior neck.  Following   cauterization of the circumflex vessels and removing some pericapsular   fat, a second cobra retractor was placed on the inferior neck.  A third   retractor was placed on the anterior acetabulum after elevating the   anterior rectus.  A L-capsulotomy was along the line of the   superior neck to the trochanteric fossa, then extended proximally and   distally.  Tag sutures were placed and the retractors were then placed   intracapsular.  We then identified the trochanteric fossa and   orientation of my neck cut, confirmed this radiographically   and then made a neck osteotomy with the femur on traction.  The femoral   head was removed without difficulty or complication.  Traction was let   off and retractors were placed posterior and anterior around the   acetabulum.      The labrum and foveal tissue were debrided.  I began reaming with a 45mm   reamer and reamed up to 51mm reamer with good bony bed preparation and a 52   cup was chosen.  The final 52mm Pinnacle cup was then impacted under fluoroscopy  to  confirm the depth of penetration and orientation with respect to   abduction.  A screw was placed followed by the hole eliminator.  The final   36+4 neutral Altrex liner was impacted with good visualized rim fit.  The cup was positioned anatomically within the acetabular portion of the pelvis.      At this point, the femur was rolled at 80 degrees.  Further capsule was   released off the inferior aspect of the femoral neck.  I then   released the superior capsule proximally.  The hook was placed laterally   along the femur and elevated manually and held in position with the bed   hook.  The leg was then extended and adducted with the leg rolled to 100   degrees of external rotation.  Once the proximal femur was fully   exposed, I used a box osteotome to set orientation.  I then began   broaching with the starting chili pepper broach and passed this by hand and then broached up to 6.  With the 6 broach in place I chose a high offset neck and did a trial reduction.  The offset was appropriate, leg lengths   appeared to be equal, confirmed radiographically.   Given these findings, I went ahead and dislocated the hip, repositioned all   retractors and positioned the right hip in the extended and abducted position.  The final 6 Hi Tri Lock stem was   chosen and it was impacted down to the level of neck cut.  Based on this   and the trial reduction, a 36+1.5 Articuleze metal ball was chosen and   impacted onto a clean and dry trunnion, and the hip was reduced.  The   hip had been irrigated throughout the case again at this point.  I did   reapproximate the superior capsular leaflet to the anterior leaflet   using #1 Vicryl, placed a medium Hemovac drain deep.  The fascia of the   tensor fascia lata muscle was then reapproximated using #1 Vicryl.  The   remaining wound was closed with 2-0 Vicryl and running 4-0 Monocryl.   The hip was cleaned, dried, and dressed sterilely using Dermabond and    Aquacel dressing.  Drain site dressed separately.  She was then  brought   to recovery room in stable condition tolerating the procedure well.    Leilani Able, PA-C was present for the entirety of the case involved from   preoperative positioning, perioperative retractor management, general   facilitation of the case, as well as primary wound closure as assistant.            Madlyn Frankel Charlann Boxer, M.D.            MDO/MEDQ  D:  06/14/2011  T:  06/14/2011  Job:  [id number]      Electronically Signed by Durene Romans M.D. on 06/20/2011 09:15:38 AM

## 2013-06-04 NOTE — Preoperative (Signed)
Beta Blockers   Reason not to administer Beta Blockers:Not Applicable 

## 2013-06-04 NOTE — Transfer of Care (Signed)
Immediate Anesthesia Transfer of Care Note  Patient: Laura Knight  Procedure(s) Performed: Procedure(s): LEFT TOTAL HIP ARTHROPLASTY ANTERIOR APPROACH (Left)  Patient Location: PACU  Anesthesia Type:Spinal  Level of Consciousness: awake, alert  and oriented  Airway & Oxygen Therapy: Patient Spontanous Breathing and Patient connected to face mask oxygen  Post-op Assessment: Report given to PACU RN and Post -op Vital signs reviewed and stable  Post vital signs: Reviewed and stable  Complications: No apparent anesthesia complications

## 2013-06-04 NOTE — Anesthesia Preprocedure Evaluation (Addendum)
Anesthesia Evaluation  Patient identified by MRN, date of birth, ID band Patient awake    Reviewed: Allergy & Precautions, H&P , NPO status , Patient's Chart, lab work & pertinent test results  Airway Mallampati: II TM Distance: >3 FB Neck ROM: full    Dental no notable dental hx. (+) Teeth Intact and Dental Advisory Given   Pulmonary neg pulmonary ROS,  breath sounds clear to auscultation  Pulmonary exam normal       Cardiovascular Exercise Tolerance: Good negative cardio ROS  Rhythm:regular Rate:Normal     Neuro/Psych negative neurological ROS  negative psych ROS   GI/Hepatic negative GI ROS, Neg liver ROS,   Endo/Other  negative endocrine ROS  Renal/GU negative Renal ROS  negative genitourinary   Musculoskeletal   Abdominal   Peds  Hematology negative hematology ROS (+)   Anesthesia Other Findings   Reproductive/Obstetrics negative OB ROS                          Anesthesia Physical Anesthesia Plan  ASA: II  Anesthesia Plan: Spinal   Post-op Pain Management:    Induction:   Airway Management Planned: Simple Face Mask  Additional Equipment:   Intra-op Plan:   Post-operative Plan:   Informed Consent: I have reviewed the patients History and Physical, chart, labs and discussed the procedure including the risks, benefits and alternatives for the proposed anesthesia with the patient or authorized representative who has indicated his/her understanding and acceptance.   Dental Advisory Given  Plan Discussed with: CRNA and Surgeon  Anesthesia Plan Comments:        Anesthesia Quick Evaluation

## 2013-06-04 NOTE — Interval H&P Note (Signed)
History and Physical Interval Note:  06/04/2013 11:16 AM  Laura Knight  has presented today for surgery, with the diagnosis of LEFT HIP OA  The various methods of treatment have been discussed with the patient and family. After consideration of risks, benefits and other options for treatment, the patient has consented to  Procedure(s): LEFT TOTAL HIP ARTHROPLASTY ANTERIOR APPROACH (Left) as a surgical intervention .  The patient's history has been reviewed, patient examined, no change in status, stable for surgery.  I have reviewed the patient's chart and labs.  Questions were answered to the patient's satisfaction.     Shelda Pal

## 2013-06-04 NOTE — Anesthesia Procedure Notes (Signed)
Spinal  Patient location during procedure: OR Start time: 06/04/2013 12:32 PM End time: 06/04/2013 12:45 PM Staffing Anesthesiologist: Ronelle Nigh L Performed by: anesthesiologist  Preanesthetic Checklist Completed: patient identified, site marked, surgical consent, pre-op evaluation, timeout performed, IV checked, risks and benefits discussed and monitors and equipment checked Spinal Block Patient position: sitting Prep: Betadine Patient monitoring: heart rate, continuous pulse ox and blood pressure Approach: right paramedian Location: L3-4 Injection technique: single-shot Needle Needle type: Spinocan  Needle gauge: 22 G Needle length: 9 cm Assessment Sensory level: T6 Additional Notes Expiration date of kit checked and confirmed. Patient tolerated procedure well, without complications.

## 2013-06-04 NOTE — Anesthesia Postprocedure Evaluation (Signed)
  Anesthesia Post-op Note  Patient: Laura Knight  Procedure(s) Performed: Procedure(s) (LRB): LEFT TOTAL HIP ARTHROPLASTY ANTERIOR APPROACH (Left)  Patient Location: PACU  Anesthesia Type: Spinal  Level of Consciousness: awake and alert   Airway and Oxygen Therapy: Patient Spontanous Breathing  Post-op Pain: mild  Post-op Assessment: Post-op Vital signs reviewed, Patient's Cardiovascular Status Stable, Respiratory Function Stable, Patent Airway and No signs of Nausea or vomiting  Last Vitals:  Filed Vitals:   06/04/13 1500  BP: 146/69  Pulse: 67  Temp:   Resp: 18    Post-op Vital Signs: stable   Complications: No apparent anesthesia complications

## 2013-06-05 ENCOUNTER — Encounter (HOSPITAL_COMMUNITY): Payer: Self-pay | Admitting: Orthopedic Surgery

## 2013-06-05 DIAGNOSIS — D500 Iron deficiency anemia secondary to blood loss (chronic): Secondary | ICD-10-CM | POA: Diagnosis not present

## 2013-06-05 LAB — BASIC METABOLIC PANEL
BUN: 16 mg/dL (ref 6–23)
CO2: 26 mEq/L (ref 19–32)
Chloride: 102 mEq/L (ref 96–112)
GFR calc Af Amer: >90 mL/min (ref >90)
GFR calc non Af Amer: 80 mL/min — ABNORMAL LOW (ref >90)
Glucose, Bld: 127 mg/dL — ABNORMAL HIGH (ref 70–99)
Potassium: 4.1 mEq/L (ref 3.5–5.1)
Sodium: 136 mEq/L (ref 135–145)

## 2013-06-05 LAB — CBC
HCT: 33.6 % — ABNORMAL LOW (ref 36.0–46.0)
Hemoglobin: 11 g/dL — ABNORMAL LOW (ref 12.0–15.0)
MCHC: 32.7 g/dL (ref 30.0–36.0)
RBC: 3.77 MIL/uL — ABNORMAL LOW (ref 3.87–5.11)
RDW: 13.6 % (ref 11.5–15.5)
WBC: 8.8 10*3/uL (ref 4.0–10.5)

## 2013-06-05 NOTE — Evaluation (Signed)
Physical Therapy Evaluation Patient Details Name: Laura Knight MRN: [id number] DOB: [birth year] Today's Date: 06/05/2013 Time: 4098-1191 PT Time Calculation (min): 28 min  PT Assessment / Plan / Recommendation History of Present Illness     Clinical Impression  Pt s/p L THR presents with decreased L LE strength/ROM and post op pain limiting functional mobility.  Pt would benefit from SNF level rehab prior to d/c home.    PT Assessment  Patient needs continued PT services    Follow Up Recommendations  SNF    Does the patient have the potential to tolerate intense rehabilitation      Barriers to Discharge        Equipment Recommendations  None recommended by PT    Recommendations for Other Services OT consult   Frequency 7X/week    Precautions / Restrictions Precautions Precautions: Fall Restrictions Weight Bearing Restrictions: No Other Position/Activity Restrictions: WBAT   Pertinent Vitals/Pain 4/10; premed, ice pack provided      Mobility  Bed Mobility Bed Mobility: Supine to Sit Supine to Sit: 1: +2 Total assist Supine to Sit: Patient Percentage: 50% Details for Bed Mobility Assistance: cues for sequence and use of L LE to self assist.  Pad utilized to assist pt to EOB Transfers Transfers: Sit to Stand;Stand to Sit Sit to Stand: 1: +2 Total assist Sit to Stand: Patient Percentage: 60% Stand to Sit: 1: +2 Total assist Stand to Sit: Patient Percentage: 60% Details for Transfer Assistance: cues for LE management and use of UEs to self assist Ambulation/Gait Ambulation/Gait Assistance: 1: +2 Total assist Ambulation/Gait: Patient Percentage: 70% Ambulation Distance (Feet): 45 Feet Assistive device: Rolling walker Ambulation/Gait Assistance Details: cues for sequence, posture, stride length and position from RW Gait Pattern: Step-to pattern;Decreased step length - right;Decreased step length - left;Shuffle;Trunk flexed    Exercises Total Joint  Exercises Ankle Circles/Pumps: AROM;10 reps;Both;Supine Quad Sets: AROM;Both;10 reps;Supine Heel Slides: AAROM;15 reps;Supine;Left Hip ABduction/ADduction: AAROM;Supine;10 reps;Left   PT Diagnosis: Difficulty walking  PT Problem List: Decreased strength;Decreased range of motion;Decreased activity tolerance;Decreased mobility;Decreased knowledge of use of DME;Pain PT Treatment Interventions: DME instruction;Gait training;Stair training;Functional mobility training;Therapeutic activities;Therapeutic exercise;Patient/family education     PT Goals(Current goals can be found in the care plan section) Acute Rehab PT Goals Patient Stated Goal: Rehab at [facility] and then home  PT Goal Formulation: With patient Time For Goal Achievement: 06/12/13 Potential to Achieve Goals: Good  Visit Information  Last PT Received On: 06/05/13 Assistance Needed: +2       Prior Functioning  Home Living Family/patient expects to be discharged to:: Skilled nursing facility Living Arrangements: Children Prior Function Level of Independence: Independent with assistive device(s) Comments: used cane Communication Communication: No difficulties Dominant Hand: Right    Cognition  Cognition Arousal/Alertness: Awake/alert Behavior During Therapy: WFL for tasks assessed/performed Overall Cognitive Status: Within Functional Limits for tasks assessed    Extremity/Trunk Assessment Upper Extremity Assessment Upper Extremity Assessment: Overall WFL for tasks assessed Lower Extremity Assessment Lower Extremity Assessment: Overall WFL for tasks assessed LLE Deficits / Details: Hip strength 2/5 with AAROM at hip to 90 flex and 15 abd   Balance    End of Session PT - End of Session Equipment Utilized During Treatment: Gait belt Activity Tolerance: Patient tolerated treatment well Patient left: in chair;with call bell/phone within reach;with family/visitor present Nurse Communication: Mobility status  GP      Nickolai Rinks 06/05/2013, 11:55 AM

## 2013-06-05 NOTE — Progress Notes (Signed)
Physical Therapy Treatment Patient Details Name: Laura Knight MRN: [id number] DOB: Aug 20, 1925 Today's Date: 06/05/2013 Time: 1201-1220 PT Time Calculation (min): 19 min  PT Assessment / Plan / Recommendation  History of Present Illness     PT Comments     Follow Up Recommendations  SNF     Does the patient have the potential to tolerate intense rehabilitation     Barriers to Discharge        Equipment Recommendations  None recommended by PT    Recommendations for Other Services OT consult  Frequency 7X/week   Progress towards PT Goals Progress towards PT goals: Progressing toward goals  Plan Current plan remains appropriate    Precautions / Restrictions Precautions Precautions: Fall Restrictions Weight Bearing Restrictions: No Other Position/Activity Restrictions: WBAT   Pertinent Vitals/Pain 3/10    Mobility  Bed Mobility Bed Mobility: Sit to Supine Sit to Supine: 3: Mod assist Details for Bed Mobility Assistance: cues for sequence and use of L LE to self assist.  Pad utilized to assist pt to EOB Transfers Transfers: Sit to Stand;Stand to Sit Sit to Stand: 3: Mod assist Stand to Sit: 3: Mod assist Details for Transfer Assistance: cues for LE management and use of UEs to self assist Ambulation/Gait Ambulation/Gait Assistance: 3: Mod assist;4: Min assist Ambulation Distance (Feet): 50 Feet Assistive device: Rolling walker Ambulation/Gait Assistance Details: cues for sequence, posture and position from RW Gait Pattern: Step-to pattern;Decreased step length - right;Decreased step length - left;Shuffle;Trunk flexed    Exercises     PT Diagnosis:    PT Problem List:   PT Treatment Interventions:     PT Goals (current goals can now be found in the care plan section) Acute Rehab PT Goals Patient Stated Goal: Rehab at [facility] and then home  PT Goal Formulation: With patient Time For Goal Achievement: 06/12/13 Potential to Achieve Goals: Good  Visit  Information  Last PT Received On: 06/05/13 Assistance Needed: +1    Subjective Data  Patient Stated Goal: Rehab at [place] and then home    Cognition  Cognition Arousal/Alertness: Awake/alert Behavior During Therapy: WFL for tasks assessed/performed Overall Cognitive Status: Within Functional Limits for tasks assessed    Balance     End of Session PT - End of Session Equipment Utilized During Treatment: Gait belt Activity Tolerance: Patient tolerated treatment well Patient left: with call bell/phone within reach;with family/visitor present;in bed Nurse Communication: Mobility status   GP     Anthon Harpole 06/05/2013, 2:31 PM

## 2013-06-05 NOTE — Care Management Note (Signed)
    Page 1 of 1   06/05/2013     3:16:51 PM   CARE MANAGEMENT NOTE 06/05/2013  Patient:  Laura Knight,Laura Knight   Account Number:  [id number]  Date Initiated:  06/05/2013  Documentation initiated by:  Colleen Can  Subjective/Objective Assessment:   dx left hip arthroplasty-anterior approach     Action/Plan:   SNF rehab has been requested. CSW referral. CM will follow as needed.   Anticipated DC Date:  06/07/2013   Anticipated DC Plan:  SKILLED NURSING FACILITY  In-house referral  Clinical Social Worker      DC Planning Services  CM consult      Choice offered to / List presented to:             Status of service:  Completed, signed off Medicare Important Message given?  NA - LOS <3 / Initial given by admissions (If response is "NO", the following Medicare IM given date fields will be blank) Date Medicare IM given:   Date Additional Medicare IM given:    Discharge Disposition:    Per UR Regulation:    If discussed at Long Length of Stay Meetings, dates discussed:    Comments:

## 2013-06-05 NOTE — Clinical Social Work Psychosocial (Signed)
     Clinical Social Work Department BRIEF PSYCHOSOCIAL ASSESSMENT 06/05/2013  Patient:  Laura Knight,Laura Knight     Account Number:  [id number]     Admit date:  06/04/2013  Clinical Social Worker:  Hattie Perch  Date/Time:  06/05/2013 12:00 M  Referred by:  Physician  Date Referred:  06/05/2013 Referred for  SNF Placement   Other Referral:   Interview type:  Patient Other interview type:    PSYCHOSOCIAL DATA Living Status:  ALONE Admitted from facility:   Level of care:   Primary support name:  Lafe Garin Primary support relationship to patient:  CHILD, ADULT Degree of support available:   good    CURRENT CONCERNS Current Concerns  Post-Acute Placement   Other Concerns:    SOCIAL WORK ASSESSMENT / PLAN CSW met with patient. patient is alert and oriented X3. patient in need of snf placement. patient has already spoken to [facility] and would like for CSW to arrange for her to go there.   Assessment/plan status:   Other assessment/ plan:   Information/referral to community resources:    PATIENTS/FAMILYS RESPONSE TO PLAN OF CARE: patient is hopeful that she can go to [facility] and improve after a short term rehab stay.

## 2013-06-05 NOTE — Progress Notes (Signed)
OT Cancellation Note  Patient Details Name: [name] MRN: [id number] DOB: Jan 15, 1925   Cancelled Treatment:    Reason Eval/Treat Not Completed: Other (comment)  Pt plans snf for rehab.  OT eval will be deferred to that venue.  Liesa Tsan 06/05/2013, 10:21 AM Marica Otter, OTR/L [phone] 06/05/2013

## 2013-06-05 NOTE — Progress Notes (Signed)
Nutrition Brief Note  Patient identified on the Malnutrition Screening Tool (MST) Report  Wt Readings from Last 15 Encounters:  06/04/13 140 lb (63.504 kg)  06/04/13 140 lb (63.504 kg)  05/29/13 140 lb 6.4 oz (63.685 kg)    Body mass index is 24.02 kg/(m^2). Patient meets criteria for Normal Weight based on current BMI.   Current diet order is Regular, patient is consuming approximately 90% of meals at this time. Pt denies weight loss and states she was eating well PTA. She had a banana and yogurt for lunch- states she always eats a small lunch. Encouraged adequate PO intake with protein-rich foods daily. Labs and medications reviewed.   No nutrition interventions warranted at this time. If nutrition issues arise, please consult RD.   Ian Malkin RD, LDN Inpatient Clinical Dietitian Pager: [phone] After Hours Pager: [phone]

## 2013-06-05 NOTE — Progress Notes (Signed)
   Subjective: 1 Day Post-Op Procedure(s) (LRB): LEFT TOTAL HIP ARTHROPLASTY ANTERIOR APPROACH (Left)   Patient reports pain as mild, pain well controlled. No events throughout the night.   Objective:   VITALS:   Filed Vitals:   06/05/13 0555  BP: 105/71  Pulse: 69  Temp: 98.2 F (36.8 C)  Resp: 16    Neurovascular intact Dorsiflexion/Plantar flexion intact Incision: dressing C/D/I No cellulitis present Compartment soft  LABS  Recent Labs  06/05/13 0520  HGB 11.0*  HCT 33.6*  WBC 8.8  PLT 240     Recent Labs  06/05/13 0520  NA 136  K 4.1  BUN 16  CREATININE 0.62  GLUCOSE 127*     Assessment/Plan: 1 Day Post-Op Procedure(s) (LRB): LEFT TOTAL HIP ARTHROPLASTY ANTERIOR APPROACH (Left) Foley cath d/ce'd Advance diet Up with therapy D/C IV fluids Discharge to SNF eventually, when ready  Expected ABLA  Treated with iron and will observe         Anastasio Auerbach. Micala Saltsman   PAC  06/05/2013, 8:57 AM

## 2013-06-05 NOTE — Progress Notes (Signed)
Utilization review completed.  

## 2013-06-06 ENCOUNTER — Encounter (HOSPITAL_COMMUNITY): Payer: Self-pay | Admitting: *Deleted

## 2013-06-06 LAB — BASIC METABOLIC PANEL
BUN: 13 mg/dL (ref 6–23)
CO2: 27 mEq/L (ref 19–32)
Calcium: 8.6 mg/dL (ref 8.4–10.5)
Creatinine, Ser: 0.74 mg/dL (ref 0.50–1.10)
GFR calc non Af Amer: 75 mL/min — ABNORMAL LOW (ref >90)
Glucose, Bld: 116 mg/dL — ABNORMAL HIGH (ref 70–99)
Sodium: 135 mEq/L (ref 135–145)

## 2013-06-06 LAB — CBC
HCT: 30.8 % — ABNORMAL LOW (ref 36.0–46.0)
Hemoglobin: 10 g/dL — ABNORMAL LOW (ref 12.0–15.0)
MCH: 29.2 pg (ref 26.0–34.0)
MCHC: 32.5 g/dL (ref 30.0–36.0)
MCV: 90.1 fL (ref 78.0–100.0)
RBC: 3.42 MIL/uL — ABNORMAL LOW (ref 3.87–5.11)

## 2013-06-06 MED ORDER — FERROUS SULFATE 325 (65 FE) MG PO TABS: 325.0000 mg | ORAL_TABLET | Freq: Three times a day (TID) | ORAL | Status: DC

## 2013-06-06 MED ORDER — HYDROCODONE-ACETAMINOPHEN 7.5-325 MG PO TABS: 1.0000 | ORAL_TABLET | ORAL | Status: DC | PRN

## 2013-06-06 MED ORDER — POLYETHYLENE GLYCOL 3350 17 G PO PACK: 17.0000 g | PACK | Freq: Every day | ORAL | Status: DC | PRN

## 2013-06-06 MED ORDER — METHOCARBAMOL 500 MG PO TABS: 500.0000 mg | ORAL_TABLET | Freq: Four times a day (QID) | ORAL | Status: DC | PRN

## 2013-06-06 MED ORDER — ASPIRIN 325 MG PO TBEC: 325.0000 mg | DELAYED_RELEASE_TABLET | Freq: Two times a day (BID) | ORAL | Status: AC

## 2013-06-06 MED ORDER — DSS 100 MG PO CAPS: 100.0000 mg | ORAL_CAPSULE | Freq: Two times a day (BID) | ORAL | Status: DC

## 2013-06-06 NOTE — Progress Notes (Signed)
   Subjective: 2 Days Post-Op Procedure(s) (LRB): LEFT TOTAL HIP ARTHROPLASTY ANTERIOR APPROACH (Left)   Patient reports pain as mild, pain well controlled. No events throughout the night.   Objective:   VITALS:   Filed Vitals:   06/06/13 0628  BP: 117/49  Pulse: 102  Temp: 98.2 F (36.8 C)  Resp: 18    Neurovascular intact Dorsiflexion/Plantar flexion intact Incision: dressing C/D/I No cellulitis present Compartment soft  LABS  Recent Labs  06/05/13 0520 06/06/13 0600  HGB 11.0* 10.0*  HCT 33.6* 30.8*  WBC 8.8 9.3  PLT 240 221     Recent Labs  06/05/13 0520 06/06/13 0600  NA 136 135  K 4.1 4.2  BUN 16 13  CREATININE 0.62 0.74  GLUCOSE 127* 116*     Assessment/Plan: 2 Days Post-Op Procedure(s) (LRB): LEFT TOTAL HIP ARTHROPLASTY ANTERIOR APPROACH (Left) Up with therapy Discharge to SNF eventually, when ready  Expected ABLA  Treated with iron and will observe     Anastasio Auerbach. Taijuan Serviss   PAC  06/06/2013, 8:42 AM

## 2013-06-06 NOTE — Progress Notes (Signed)
Physical Therapy Treatment Patient Details Name: Laura Knight MRN: [id number] DOB: 1925/07/01 Today's Date: 06/06/2013 Time: 0921-0959 PT Time Calculation (min): 38 min  PT Assessment / Plan / Recommendation  History of Present Illness     PT Comments     Follow Up Recommendations  SNF     Does the patient have the potential to tolerate intense rehabilitation     Barriers to Discharge        Equipment Recommendations  None recommended by PT    Recommendations for Other Services OT consult  Frequency 7X/week   Progress towards PT Goals Progress towards PT goals: Progressing toward goals  Plan Current plan remains appropriate    Precautions / Restrictions Precautions Precautions: Fall Restrictions Weight Bearing Restrictions: No Other Position/Activity Restrictions: WBAT   Pertinent Vitals/Pain 5/10; premed, ice pack provided    Mobility  Bed Mobility Bed Mobility: Supine to Sit Sit to Supine: 3: Mod assist Details for Bed Mobility Assistance: cues for sequence and use of L LE to self assist.  Pad utilized to assist pt to EOB Transfers Transfers: Sit to Stand;Stand to Sit Sit to Stand: 3: Mod assist Stand to Sit: 4: Min assist;3: Mod assist Details for Transfer Assistance: cues for LE management and use of UEs to self assist Ambulation/Gait Ambulation/Gait Assistance: 3: Mod assist;4: Min assist Ambulation Distance (Feet): 72 Feet Assistive device: Rolling walker Ambulation/Gait Assistance Details: cues for sequence, posture, stride length, step to, and position from RW Gait Pattern: Step-to pattern;Decreased step length - right;Decreased step length - left;Shuffle;Trunk flexed Gait velocity: decr    Exercises Total Joint Exercises Ankle Circles/Pumps: AROM;Both;Supine;20 reps Quad Sets: AROM;Both;10 reps;Supine Gluteal Sets: AROM;10 reps;Both;Supine Heel Slides: AAROM;Supine;Left;20 reps Hip ABduction/ADduction: AAROM;Supine;Left;15 reps   PT Diagnosis:     PT Problem List:   PT Treatment Interventions:     PT Goals (current goals can now be found in the care plan section) Acute Rehab PT Goals Patient Stated Goal: Rehab at [facility] and then home  PT Goal Formulation: With patient Time For Goal Achievement: 06/12/13 Potential to Achieve Goals: Good  Visit Information  Last PT Received On: 06/06/13 Assistance Needed: +1    Subjective Data  Patient Stated Goal: Rehab at [place] and then home    Cognition  Cognition Arousal/Alertness: Awake/alert Behavior During Therapy: WFL for tasks assessed/performed Overall Cognitive Status: Within Functional Limits for tasks assessed    Balance     End of Session PT - End of Session Equipment Utilized During Treatment: Gait belt Activity Tolerance: Patient tolerated treatment well Patient left: with call bell/phone within reach;with family/visitor present;in bed Nurse Communication: Mobility status   GP     Laura Knight 06/06/2013, 1:04 PM

## 2013-06-06 NOTE — Discharge Summary (Signed)
Physician Discharge Summary  Patient ID: Laura Knight MRN: [id number] DOB/AGE: [birth year] 77 y.o.  Admit date: 06/04/2013 Discharge date:  06/07/2013   Procedures:  Procedure(s) (LRB): LEFT TOTAL HIP ARTHROPLASTY ANTERIOR APPROACH (Left)  Attending Physician:  Dr. Durene Romans   Admission Diagnoses:   Left hip OA / pain  Discharge Diagnoses:  Principal Problem:   S/P left THA, AA Active Problems:   Expected blood loss anemia  Past Medical History  Diagnosis Date  . GERD (gastroesophageal reflux disease)   . Arthritis   . History of blood transfusion yrs ago    HPI: Laura Knight, 79 y.o. female, has a history of pain and functional disability in the left hip(s) due to arthritis and patient has failed non-surgical conservative treatments for greater than 12 weeks to include NSAID's and/or analgesics, use of assistive devices and activity modification. Onset of symptoms was gradual starting 2+ years ago with gradually worsening course since that time.The patient noted prior procedures of the hip to include arthroplasty on the right hip(s). Patient currently rates pain in the left hip at 8 out of 10 with activity. Patient has night pain, worsening of pain with activity and weight bearing, trendelenberg gait, pain that interfers with activities of daily living and pain with passive range of motion. Patient has evidence of periarticular osteophytes and joint space narrowing by imaging studies. This condition presents safety issues increasing the risk of falls. There is no current active infection. Risks, benefits and expectations were discussed with the patient. Patient understand the risks, benefits and expectations and wishes to proceed with surgery.  PCP: No primary provider on file.   Discharged Condition: good  Hospital Course:  Patient underwent the above stated procedure on 06/04/2013. Patient tolerated the procedure well and brought to the recovery room in good condition  and subsequently to the floor.  POD #1 BP: 105/71 ; Pulse: 69 ; Temp: 98.2 F (36.8 C) ; Resp: 16  Pt's foley was removed, as well as the hemovac drain removed. IV was changed to a saline lock. Patient reports pain as mild, pain well controlled. No events throughout the night.  Neurovascular intact, dorsiflexion/plantar flexion intact, incision: dressing C/D/I, no cellulitis present and compartment soft.   LABS  Basename    HGB  11.0  HCT  33.6   POD #2  BP: 117/49 ; Pulse: 102 ; Temp: 98.2 F (36.8 C) ; Resp: 18  Patient reports pain as mild, pain well controlled. No events throughout the night.  Neurovascular intact, dorsiflexion/plantar flexion intact, incision: dressing C/D/I, no cellulitis present and compartment soft.   LABS  Basename    HGB  10.0  HCT  30.8   POD #3  BP: 115/70 ; Pulse: 88 ; Temp: 98.5 F (36.9 C) ; Resp: 14  Patient reports pain as mild. Up to chair this am eating breakfast comfortably.  Ready to be discharged to SNF. Neurovascular intact, dorsiflexion/plantar flexion intact, incision: dressing C/D/I, no cellulitis present and compartment soft.   LABS   No new labs   Discharge Exam: General appearance: alert, cooperative and no distress Extremities: Homans sign is negative, no sign of DVT, no edema, redness or tenderness in the calves or thighs and no ulcers, gangrene or trophic changes  Disposition: Skilled nursing facility with follow up in 2 weeks   Follow-up Information   Follow up with Shelda Pal, MD. Schedule an appointment as soon as possible for a visit in 2 weeks.   Specialty:  Orthopedic Surgery  Contact information:   [address] [place] [address] [phone]       Discharge Orders   Future Orders Complete By Expires   Call MD / Call 911  As directed    Comments:     If you experience chest pain or shortness of breath, CALL 911 and be transported to the hospital emergency room.  If you develope a  fever above 101 F, pus (white drainage) or increased drainage or redness at the wound, or calf pain, call your surgeon's office.   Change dressing  As directed    Comments:     Maintain surgical dressing for 10-14 days, then replace with 4x4 guaze and tape. Keep the area dry and clean.   Constipation Prevention  As directed    Comments:     Drink plenty of fluids.  Prune juice may be helpful.  You may use a stool softener, such as Colace (over the counter) 100 mg twice a day.  Use MiraLax (over the counter) for constipation as needed.   Diet - low sodium heart healthy  As directed    Discharge instructions  As directed    Comments:     Maintain surgical dressing for 10-14 days, then replace with gauze and tape. Keep the area dry and clean until follow up. Follow up in 2 weeks at [facility]. Call with any questions or concerns.   Increase activity slowly as tolerated  As directed    TED hose  As directed    Comments:     Use stockings (TED hose) for 2 weeks on both leg(s).  You may remove them at night for sleeping.   Weight bearing as tolerated  As directed    Questions:     Laterality:     Extremity:          Medication List         aspirin 325 MG EC tablet  Take 1 tablet (325 mg total) by mouth 2 (two) times daily.     cholecalciferol 1000 UNITS tablet  Commonly known as:  VITAMIN D  Take 1,000 Units by mouth daily.     DSS 100 MG Caps  Take 100 mg by mouth 2 (two) times daily.     ferrous sulfate 325 (65 FE) MG tablet  Take 1 tablet (325 mg total) by mouth 3 (three) times daily after meals.     FLUOROMETHOLONE OP  Apply 1 drop to eye daily.     HYDROcodone-acetaminophen 7.5-325 MG per tablet  Commonly known as:  NORCO  Take 1-2 tablets by mouth every 4 (four) hours as needed for pain.     methocarbamol 500 MG tablet  Commonly known as:  ROBAXIN  Take 1 tablet (500 mg total) by mouth every 6 (six) hours as needed (muscle spasms).     multivitamin  tablet  Take 1 tablet by mouth daily.     polyethylene glycol packet  Commonly known as:  MIRALAX / GLYCOLAX  Take 17 g by mouth daily as needed.     vitamin C 100 MG tablet  Take 100 mg by mouth daily.         Signed: Anastasio Auerbach. Vennie Salsbury   PAC  06/07/2013, 7:58 AM

## 2013-06-06 NOTE — Progress Notes (Signed)
Physical Therapy Treatment Patient Details Name: Laura Knight MRN: [id number] DOB: June 28, 1925 Today's Date: 06/06/2013 Time: 9147-8295 PT Time Calculation (min): 23 min  PT Assessment / Plan / Recommendation  History of Present Illness     PT Comments     Follow Up Recommendations  SNF     Does the patient have the potential to tolerate intense rehabilitation     Barriers to Discharge        Equipment Recommendations  None recommended by PT    Recommendations for Other Services OT consult  Frequency 7X/week   Progress towards PT Goals Progress towards PT goals: Progressing toward goals  Plan Current plan remains appropriate    Precautions / Restrictions Precautions Precautions: Fall Restrictions Weight Bearing Restrictions: No Other Position/Activity Restrictions: WBAT   Pertinent Vitals/Pain 4/10; meds requested, ice packs provided    Mobility  Transfers Transfers: Sit to Stand;Stand to Sit Sit to Stand: 3: Mod assist;From toilet Stand to Sit: 4: Min assist;3: Mod assist;To chair/3-in-1 Details for Transfer Assistance: cues for LE management and use of UEs to self assist Ambulation/Gait Ambulation/Gait Assistance: 3: Mod assist;4: Min assist Ambulation Distance (Feet): 111 Feet Assistive device: Rolling walker Ambulation/Gait Assistance Details: cues for posture, sequence, position from RW and stride length Gait Pattern: Step-to pattern;Decreased step length - right;Decreased step length - left;Shuffle;Trunk flexed Gait velocity: decr    Exercises     PT Diagnosis:    PT Problem List:   PT Treatment Interventions:     PT Goals (current goals can now be found in the care plan section) Acute Rehab PT Goals Patient Stated Goal: Rehab at [facility] and then home  PT Goal Formulation: With patient Time For Goal Achievement: 06/12/13 Potential to Achieve Goals: Good  Visit Information  Last PT Received On: 06/06/13 Assistance Needed: +1    Subjective  Data  Patient Stated Goal: Rehab at [place] and then home    Cognition  Cognition Arousal/Alertness: Awake/alert Behavior During Therapy: WFL for tasks assessed/performed Overall Cognitive Status: Within Functional Limits for tasks assessed    Balance     End of Session PT - End of Session Equipment Utilized During Treatment: Gait belt Activity Tolerance: Patient tolerated treatment well Patient left: with call bell/phone within reach;with family/visitor present;in chair Nurse Communication: Mobility status   GP     Laura Knight 06/06/2013, 4:54 PM

## 2013-06-07 ENCOUNTER — Other Ambulatory Visit: Payer: Self-pay | Admitting: *Deleted

## 2013-06-07 DIAGNOSIS — Z471 Aftercare following joint replacement surgery: Secondary | ICD-10-CM | POA: Diagnosis not present

## 2013-06-07 DIAGNOSIS — K5900 Constipation, unspecified: Secondary | ICD-10-CM | POA: Diagnosis not present

## 2013-06-07 DIAGNOSIS — Z96649 Presence of unspecified artificial hip joint: Secondary | ICD-10-CM | POA: Diagnosis not present

## 2013-06-07 DIAGNOSIS — M6281 Muscle weakness (generalized): Secondary | ICD-10-CM | POA: Diagnosis not present

## 2013-06-07 DIAGNOSIS — R279 Unspecified lack of coordination: Secondary | ICD-10-CM | POA: Diagnosis not present

## 2013-06-07 DIAGNOSIS — D500 Iron deficiency anemia secondary to blood loss (chronic): Secondary | ICD-10-CM | POA: Diagnosis not present

## 2013-06-07 DIAGNOSIS — R269 Unspecified abnormalities of gait and mobility: Secondary | ICD-10-CM | POA: Diagnosis not present

## 2013-06-07 DIAGNOSIS — D62 Acute posthemorrhagic anemia: Secondary | ICD-10-CM | POA: Diagnosis not present

## 2013-06-07 DIAGNOSIS — M1990 Unspecified osteoarthritis, unspecified site: Secondary | ICD-10-CM | POA: Diagnosis not present

## 2013-06-07 DIAGNOSIS — M169 Osteoarthritis of hip, unspecified: Secondary | ICD-10-CM | POA: Diagnosis not present

## 2013-06-07 MED ORDER — HYDROCODONE-ACETAMINOPHEN 7.5-325 MG PO TABS: ORAL_TABLET | ORAL | Status: DC

## 2013-06-07 NOTE — Progress Notes (Signed)
Patient ID: Laura Knight, female   DOB: 06-18-1925, 79 y.o.   MRN: [id number] Subjective: 3 Days Post-Op Procedure(s) (LRB): LEFT TOTAL HIP ARTHROPLASTY ANTERIOR APPROACH (Left)    Patient reports pain as mild.  Up to chair this am eating breakfast comfortably  Objective:   VITALS:   Filed Vitals:   06/07/13 0621  BP: 115/70  Pulse: 88  Temp: 98.5 F (36.9 C)  Resp: 14    Neurovascular intact Dressing c/d/i  LABS  Recent Labs  06/05/13 0520 06/06/13 0600  HGB 11.0* 10.0*  HCT 33.6* 30.8*  WBC 8.8 9.3  PLT 240 221     Recent Labs  06/05/13 0520 06/06/13 0600  NA 136 135  K 4.1 4.2  BUN 16 13  CREATININE 0.62 0.74  GLUCOSE 127* 116*    No results found for this basename: LABPT, INR,  in the last 72 hours   Assessment/Plan: 3 Days Post-Op Procedure(s) (LRB): LEFT TOTAL HIP ARTHROPLASTY ANTERIOR APPROACH (Left)   Up with therapy Discharge to SNF today when bed available RTC in 2 weeks for first post op visit

## 2013-06-07 NOTE — Progress Notes (Signed)
Patient cleared for discharge. Packet copied and placed in [place]. Met with patient and daughter at bedside. Patient is alert and oriented X3. Patient would like to be transported in daughters car. CSW discussed risks and benefits of same. Daughter to transport patient after lunch today when bed is available at [facility]. No further CSW needs noted.  Laura Knight MSW, LCSW [phone]

## 2013-06-07 NOTE — Clinical Social Work Placement (Signed)
     Clinical Social Work Department CLINICAL SOCIAL WORK PLACEMENT NOTE 06/07/2013  Patient:  Laura Knight,Laura Knight  Account Number:  [id number] Admit date:  06/04/2013  Clinical Social Worker:  Becky Sax, LCSW  Date/time:  06/07/2013 12:00 M  Clinical Social Work is seeking post-discharge placement for this patient at the following level of care:   SKILLED NURSING   (*CSW will update this form in Epic as items are completed)   06/07/2013  Patient/family provided with [facility] Department of Clinical Social Works list of facilities offering this level of care within the geographic area requested by the patient (or if unable, by the patients family).  06/07/2013  Patient/family informed of their freedom to choose among providers that offer the needed level of care, that participate in Medicare, Medicaid or managed care program needed by the patient, have an available bed and are willing to accept the patient.  06/07/2013  Patient/family informed of [facility] ownership interest in [facility], as well as of the fact that they are under no obligation to receive care at this facility.  PASARR submitted to EDS on 06/07/2013 PASARR number received from EDS on 06/07/2013  FL2 transmitted to all facilities in geographic area requested by pt/family on  06/07/2013 FL2 transmitted to all facilities within larger geographic area on   Patient informed that his/her managed care company has contracts with or will negotiate with  certain facilities, including the following:     Patient/family informed of bed offers received:  06/07/2013 Patient chooses bed at [facility] Physician recommends and patient chooses bed at    Patient to be transferred to [facility] on  06/07/2013 Patient to be transferred to facility by family  The following physician request were entered in Epic:   Additional Comments:

## 2013-06-07 NOTE — Progress Notes (Signed)
Physical Therapy Treatment Patient Details Name: Laura Knight MRN: [id number] DOB: [birth year] Today's Date: 06/07/2013 Time: 4098-1191 PT Time Calculation (min): 42 min  PT Assessment / Plan / Recommendation  History of Present Illness     PT Comments     Follow Up Recommendations  SNF     Does the patient have the potential to tolerate intense rehabilitation     Barriers to Discharge        Equipment Recommendations  None recommended by PT    Recommendations for Other Services OT consult  Frequency 7X/week   Progress towards PT Goals Progress towards PT goals: Progressing toward goals  Plan Current plan remains appropriate    Precautions / Restrictions Precautions Precautions: Fall Restrictions Weight Bearing Restrictions: No Other Position/Activity Restrictions: WBAT   Pertinent Vitals/Pain Min c/o pain, premed, ice pack provided    Mobility  Transfers Transfers: Sit to Stand;Stand to Sit Sit to Stand: 4: Min assist Stand to Sit: 4: Min assist Details for Transfer Assistance: cues for LE management and use of UEs to self assist Ambulation/Gait Ambulation/Gait Assistance: 4: Min assist Ambulation Distance (Feet): 156 Feet Assistive device: Rolling walker Ambulation/Gait Assistance Details: cues for posture and position from RW Gait Pattern: Step-to pattern;Step-through pattern;Shuffle;Trunk flexed Stairs: No    Exercises Total Joint Exercises Ankle Circles/Pumps: AROM;Both;Supine;20 reps Quad Sets: AROM;Both;10 reps;Supine Gluteal Sets: AROM;10 reps;Both;Supine Heel Slides: AAROM;Supine;Left;20 reps Hip ABduction/ADduction: AAROM;Supine;Left;20 reps   PT Diagnosis:    PT Problem List:   PT Treatment Interventions:     PT Goals (current goals can now be found in the care plan section) Acute Rehab PT Goals Patient Stated Goal: Rehab at [facility] and then home  PT Goal Formulation: With patient Time For Goal Achievement: 06/12/13 Potential to Achieve  Goals: Good  Visit Information  Last PT Received On: 06/07/13 Assistance Needed: +1    Subjective Data  Patient Stated Goal: Rehab at [place] and then home    Cognition  Cognition Arousal/Alertness: Awake/alert Behavior During Therapy: WFL for tasks assessed/performed Overall Cognitive Status: Within Functional Limits for tasks assessed    Balance     End of Session PT - End of Session Equipment Utilized During Treatment: Gait belt Activity Tolerance: Patient tolerated treatment well Patient left: with call bell/phone within reach;with family/visitor present;in chair Nurse Communication: Mobility status   GP     Laura Knight 06/07/2013, 8:58 AM

## 2013-06-07 NOTE — Progress Notes (Signed)
Attempted to call report to [facility], no answer when transferred  D [facility] RN

## 2013-06-14 ENCOUNTER — Non-Acute Institutional Stay (SKILLED_NURSING_FACILITY): Payer: Medicare Other | Admitting: Internal Medicine

## 2013-06-14 DIAGNOSIS — D500 Iron deficiency anemia secondary to blood loss (chronic): Secondary | ICD-10-CM

## 2013-06-14 DIAGNOSIS — M169 Osteoarthritis of hip, unspecified: Secondary | ICD-10-CM

## 2013-06-14 DIAGNOSIS — M1612 Unilateral primary osteoarthritis, left hip: Secondary | ICD-10-CM

## 2013-06-14 DIAGNOSIS — K5900 Constipation, unspecified: Secondary | ICD-10-CM

## 2013-06-14 DIAGNOSIS — K219 Gastro-esophageal reflux disease without esophagitis: Secondary | ICD-10-CM

## 2013-06-17 ENCOUNTER — Other Ambulatory Visit: Payer: Self-pay | Admitting: *Deleted

## 2013-06-17 MED ORDER — HYDROCODONE-ACETAMINOPHEN 7.5-325 MG PO TABS: ORAL_TABLET | ORAL | Status: DC

## 2013-06-18 ENCOUNTER — Non-Acute Institutional Stay (SKILLED_NURSING_FACILITY): Payer: Medicare Other | Admitting: Adult Health

## 2013-06-18 DIAGNOSIS — M1612 Unilateral primary osteoarthritis, left hip: Secondary | ICD-10-CM | POA: Insufficient documentation

## 2013-06-18 DIAGNOSIS — M169 Osteoarthritis of hip, unspecified: Secondary | ICD-10-CM

## 2013-06-18 DIAGNOSIS — K5900 Constipation, unspecified: Secondary | ICD-10-CM

## 2013-06-18 DIAGNOSIS — D500 Iron deficiency anemia secondary to blood loss (chronic): Secondary | ICD-10-CM

## 2013-06-18 NOTE — Progress Notes (Signed)
Patient ID: Ephraim Hamburger, female   DOB: [birth year], 79 y.o.   MRN: [id number]       PROGRESS NOTE  DATE: 06/18/2013   FACILITY: [facility]  LEVEL OF CARE: SNF (31) Acute Visit  CHIEF COMPLAINT:  Discharge Notes  HISTORY OF PRESENT ILLNESS: This is an 79 year old female who is for discharge home with Home health PT and OT. She has been admiited to [facility] on 06/07/13 from [facility] with Osteoarthritis S/P Left total hip arthroplasty. Patient was admitted to this facility for short-term rehabilitation after the patient's recent hospitalization.  Patient has completed SNF rehabilitation and therapy has cleared the patient for discharge.  Reassessment of ongoing problem(s):  ANEMIA: The anemia has been stable. The patient denies fatigue, melena or hematochezia. No complications from the medications currently being used. 10/14 hgb 10.0  CONSTIPATION: The constipation remains stable. No complications from the medications presently being used. Patient denies ongoing constipation, abdominal pain, nausea or vomiting.   PAST MEDICAL HISTORY : Reviewed.  No changes.  CURRENT MEDICATIONS: Reviewed per [facility]  REVIEW OF SYSTEMS:  GENERAL: no change in appetite, no fatigue, no weight changes, no fever, chills or weakness RESPIRATORY: no cough, SOB, DOE, wheezing, hemoptysis CARDIAC: no chest pain, edema or palpitations GI: no abdominal pain, diarrhea, constipation, heart burn, nausea or vomiting  PHYSICAL EXAMINATION  VS:  T98.1       P93       RR18      BP137/83      POX94 %       WT143.2 (Lb)  GENERAL: no acute distress, normal body habitus EYES: conjunctivae normal, sclerae normal, normal eye lids NECK: supple, trachea midline, no neck masses, no thyroid tenderness, no thyromegaly LYMPHATICS: no LAN in the neck, no supraclavicular LAN RESPIRATORY: breathing is even & unlabored, BS CTAB CARDIAC: RRR, no murmur,no extra heart sounds, no edema GI: abdomen  soft, normal BS, no masses, no tenderness, no hepatomegaly, no splenomegaly PSYCHIATRIC: the patient is alert & oriented to person, affect & behavior appropriate  LABS/RADIOLOGY: 06/06/13  Hgb10.0  Hct30.8  Wbc 9.3  NA135  K4.2  BUN 13  Creatinine 0.74  Glucose 116   ASSESSMENT/PLAN:  Osteoarthritis S/P Left Total Hip Arthroplasty - for Home health PT and OT  Anemia -  Continue FeSO4  Constipation - no complaints   I have filled out patient's discharge paperwork and written prescriptions.  Patient will receive home health PT and OT.  Total discharge time: Less than 30 minutes Discharge time involved coordination of the discharge process with Child psychotherapist, nursing staff and therapy department. Medical justification for home health services verified.   CPT CODE: 78295

## 2013-06-28 DIAGNOSIS — R269 Unspecified abnormalities of gait and mobility: Secondary | ICD-10-CM | POA: Diagnosis not present

## 2013-06-28 DIAGNOSIS — M6281 Muscle weakness (generalized): Secondary | ICD-10-CM | POA: Diagnosis not present

## 2013-06-28 DIAGNOSIS — Z471 Aftercare following joint replacement surgery: Secondary | ICD-10-CM | POA: Diagnosis not present

## 2013-06-28 DIAGNOSIS — IMO0001 Reserved for inherently not codable concepts without codable children: Secondary | ICD-10-CM | POA: Diagnosis not present

## 2013-06-28 DIAGNOSIS — Z96659 Presence of unspecified artificial knee joint: Secondary | ICD-10-CM | POA: Diagnosis not present

## 2013-07-01 DIAGNOSIS — Z96659 Presence of unspecified artificial knee joint: Secondary | ICD-10-CM | POA: Diagnosis not present

## 2013-07-01 DIAGNOSIS — M6281 Muscle weakness (generalized): Secondary | ICD-10-CM | POA: Diagnosis not present

## 2013-07-01 DIAGNOSIS — IMO0001 Reserved for inherently not codable concepts without codable children: Secondary | ICD-10-CM | POA: Diagnosis not present

## 2013-07-01 DIAGNOSIS — R269 Unspecified abnormalities of gait and mobility: Secondary | ICD-10-CM | POA: Diagnosis not present

## 2013-07-01 DIAGNOSIS — Z471 Aftercare following joint replacement surgery: Secondary | ICD-10-CM | POA: Diagnosis not present

## 2013-07-02 DIAGNOSIS — Z471 Aftercare following joint replacement surgery: Secondary | ICD-10-CM | POA: Diagnosis not present

## 2013-07-02 DIAGNOSIS — Z96659 Presence of unspecified artificial knee joint: Secondary | ICD-10-CM | POA: Diagnosis not present

## 2013-07-02 DIAGNOSIS — M6281 Muscle weakness (generalized): Secondary | ICD-10-CM | POA: Diagnosis not present

## 2013-07-02 DIAGNOSIS — R269 Unspecified abnormalities of gait and mobility: Secondary | ICD-10-CM | POA: Diagnosis not present

## 2013-07-02 DIAGNOSIS — IMO0001 Reserved for inherently not codable concepts without codable children: Secondary | ICD-10-CM | POA: Diagnosis not present

## 2013-07-04 DIAGNOSIS — Z471 Aftercare following joint replacement surgery: Secondary | ICD-10-CM | POA: Diagnosis not present

## 2013-07-04 DIAGNOSIS — Z96659 Presence of unspecified artificial knee joint: Secondary | ICD-10-CM | POA: Diagnosis not present

## 2013-07-04 DIAGNOSIS — M6281 Muscle weakness (generalized): Secondary | ICD-10-CM | POA: Diagnosis not present

## 2013-07-04 DIAGNOSIS — R269 Unspecified abnormalities of gait and mobility: Secondary | ICD-10-CM | POA: Diagnosis not present

## 2013-07-04 DIAGNOSIS — IMO0001 Reserved for inherently not codable concepts without codable children: Secondary | ICD-10-CM | POA: Diagnosis not present

## 2013-07-08 DIAGNOSIS — M6281 Muscle weakness (generalized): Secondary | ICD-10-CM | POA: Diagnosis not present

## 2013-07-08 DIAGNOSIS — Z96659 Presence of unspecified artificial knee joint: Secondary | ICD-10-CM | POA: Diagnosis not present

## 2013-07-08 DIAGNOSIS — IMO0001 Reserved for inherently not codable concepts without codable children: Secondary | ICD-10-CM | POA: Diagnosis not present

## 2013-07-08 DIAGNOSIS — R269 Unspecified abnormalities of gait and mobility: Secondary | ICD-10-CM | POA: Diagnosis not present

## 2013-07-08 DIAGNOSIS — Z471 Aftercare following joint replacement surgery: Secondary | ICD-10-CM | POA: Diagnosis not present

## 2013-07-08 NOTE — Progress Notes (Signed)
Patient ID: Laura Knight, female   DOB: 1925-04-07, 79 y.o.   MRN: [id number]        HISTORY & PHYSICAL  DATE: 06/14/2013   FACILITY: [facility]  LEVEL OF CARE: SNF (31)  ALLERGIES:  Allergies  Allergen Reactions  . Sulfa Antibiotics Other (See Comments)    ? Pain     CHIEF COMPLAINT:  Manage left hip osteoarthritis, acute blood loss anemia, and GERD.    HISTORY OF PRESENT ILLNESS:  The patient is an 79 year-old, Caucasian female.    HIP OSTEOARTHRITIS: patient had advanced end stage OA of the hip with progressively worsening pain & dysfunction.  Pt failed non-surgical conservative management.  Therefore pt underwent total hip arthroplasty & tolerated the procedure well.  Pt denies hip pain currently.  Pt was admitted to this facility for short term rehabilitation.    ANEMIA:  Postoperatively, patient suffered acute blood loss.   The anemia has been stable. The patient denies fatigue, melena or hematochezia. No complications from the medications currently being used.  Last hemoglobins are:    11, 10.    GERD: pt's GERD is stable.  Denies ongoing heartburn, abd. Pain, nausea or vomiting.  Currently on a PPI & tolerates it without any adverse reactions.    PAST MEDICAL HISTORY :  Past Medical History  Diagnosis Date  . GERD (gastroesophageal reflux disease)   . Arthritis   . History of blood transfusion yrs ago    PAST SURGICAL HISTORY: Past Surgical History  Procedure Laterality Date  . Abdominal hysterectomy      1 ovary removed  . Joint replacement    . Right hip replacement  1977  . Bilateral knee replacment  2008  . Total hip arthroplasty Left 06/04/2013    Procedure: LEFT TOTAL HIP ARTHROPLASTY ANTERIOR APPROACH;  Surgeon: Shelda Pal, MD;  Location: [facility];  Service: Orthopedics;  Laterality: Left;    SOCIAL HISTORY:  reports that she has quit smoking. Her smoking use included Cigarettes. She has a 6 pack-year smoking history. She has never  used smokeless tobacco. She reports that she drinks alcohol. She reports that she does not use illicit drugs.  FAMILY HISTORY: None  CURRENT MEDICATIONS: Reviewed per MAR  REVIEW OF SYSTEMS:  See HPI otherwise 14 point ROS is negative.  PHYSICAL EXAMINATION  VS:  T 96.3       P 120      RR 20      BP 121/77      POX 92%        WT (Lb)  GENERAL: no acute distress, normal body habitus EYES: conjunctivae normal, sclerae normal, normal eye lids MOUTH/THROAT: lips without lesions,no lesions in the mouth,tongue is without lesions,uvula elevates in midline NECK: supple, trachea midline, no neck masses, no thyroid tenderness, no thyromegaly LYMPHATICS: no LAN in the neck, no supraclavicular LAN RESPIRATORY: breathing is even & unlabored, BS CTAB CARDIAC: RRR, no murmur,no extra heart sounds, no edema GI:  ABDOMEN: abdomen soft, normal BS, no masses, no tenderness  LIVER/SPLEEN: no hepatomegaly, no splenomegaly MUSCULOSKELETAL: HEAD: normal to inspection & palpation BACK: no kyphosis, scoliosis or spinal processes tenderness EXTREMITIES: LEFT UPPER EXTREMITY: full range of motion, normal strength & tone RIGHT UPPER EXTREMITY:  full range of motion, normal strength & tone LEFT LOWER EXTREMITY: strength intact, range of motion moderate   RIGHT LOWER EXTREMITY:  full range of motion, normal strength & tone PSYCHIATRIC: the patient is alert & oriented  to person, affect & behavior appropriate  LABS/RADIOLOGY: Hemoglobin 10, otherwise CBC normal.    Glucose 116, otherwise BMP normal.    ASSESSMENT/PLAN:  Left hip osteoarthritis.  Status post left total hip arthroplasty.  Continue rehabilitation.    Acute blood loss anemia.  Reassess hemoglobin level.  Continue iron.     GERD.  Well controlled.     Constipation.  Continue MiraLAX.    Check CBC and BMP.    I have reviewed patient's medical records received at admission/from hospitalization.  CPT CODE: 40102

## 2013-07-09 DIAGNOSIS — K219 Gastro-esophageal reflux disease without esophagitis: Secondary | ICD-10-CM | POA: Insufficient documentation

## 2013-07-10 DIAGNOSIS — IMO0001 Reserved for inherently not codable concepts without codable children: Secondary | ICD-10-CM | POA: Diagnosis not present

## 2013-07-10 DIAGNOSIS — R269 Unspecified abnormalities of gait and mobility: Secondary | ICD-10-CM | POA: Diagnosis not present

## 2013-07-10 DIAGNOSIS — Z96659 Presence of unspecified artificial knee joint: Secondary | ICD-10-CM | POA: Diagnosis not present

## 2013-07-10 DIAGNOSIS — M6281 Muscle weakness (generalized): Secondary | ICD-10-CM | POA: Diagnosis not present

## 2013-07-10 DIAGNOSIS — Z471 Aftercare following joint replacement surgery: Secondary | ICD-10-CM | POA: Diagnosis not present

## 2013-07-12 DIAGNOSIS — IMO0001 Reserved for inherently not codable concepts without codable children: Secondary | ICD-10-CM | POA: Diagnosis not present

## 2013-07-12 DIAGNOSIS — R269 Unspecified abnormalities of gait and mobility: Secondary | ICD-10-CM | POA: Diagnosis not present

## 2013-07-12 DIAGNOSIS — Z96659 Presence of unspecified artificial knee joint: Secondary | ICD-10-CM | POA: Diagnosis not present

## 2013-07-12 DIAGNOSIS — M6281 Muscle weakness (generalized): Secondary | ICD-10-CM | POA: Diagnosis not present

## 2013-07-12 DIAGNOSIS — Z471 Aftercare following joint replacement surgery: Secondary | ICD-10-CM | POA: Diagnosis not present

## 2013-07-25 ENCOUNTER — Emergency Department (HOSPITAL_COMMUNITY)
Admission: EM | Admit: 2013-07-25 | Discharge: 2013-07-25 | Disposition: A | Discharge status: Home / Self Care | Payer: Medicare Other | Attending: Emergency Medicine | Admitting: Emergency Medicine

## 2013-07-25 ENCOUNTER — Encounter (HOSPITAL_COMMUNITY): Payer: Self-pay | Admitting: Emergency Medicine

## 2013-07-25 DIAGNOSIS — Z8739 Personal history of other diseases of the musculoskeletal system and connective tissue: Secondary | ICD-10-CM | POA: Diagnosis not present

## 2013-07-25 DIAGNOSIS — Z79899 Other long term (current) drug therapy: Secondary | ICD-10-CM | POA: Insufficient documentation

## 2013-07-25 DIAGNOSIS — N390 Urinary tract infection, site not specified: Secondary | ICD-10-CM | POA: Insufficient documentation

## 2013-07-25 DIAGNOSIS — Z8719 Personal history of other diseases of the digestive system: Secondary | ICD-10-CM | POA: Insufficient documentation

## 2013-07-25 DIAGNOSIS — Z87891 Personal history of nicotine dependence: Secondary | ICD-10-CM | POA: Insufficient documentation

## 2013-07-25 LAB — URINE MICROSCOPIC-ADD ON

## 2013-07-25 LAB — URINALYSIS, ROUTINE W REFLEX MICROSCOPIC
Glucose, UA: NEGATIVE mg/dL
Ketones, ur: NEGATIVE mg/dL
Nitrite: NEGATIVE
Protein, ur: 100 mg/dL — AB
Specific Gravity, Urine: 1.018 (ref 1.005–1.030)

## 2013-07-25 MED ORDER — HYDROCODONE-ACETAMINOPHEN 5-325 MG PO TABS: 1.0000 | ORAL_TABLET | ORAL | Status: DC | PRN

## 2013-07-25 MED ORDER — HYDROCODONE-ACETAMINOPHEN 5-325 MG PO TABS
1.0000 | ORAL_TABLET | Freq: Once | ORAL | Status: AC
  Administered 2013-07-25: 1 via ORAL
  Filled 2013-07-25: qty 1

## 2013-07-25 MED ORDER — CEFTRIAXONE SODIUM 1 G IJ SOLR
1.0000 g | Freq: Once | INTRAMUSCULAR | Status: AC
  Administered 2013-07-25: 1 g via INTRAMUSCULAR
  Filled 2013-07-25: qty 10

## 2013-07-25 MED ORDER — PHENAZOPYRIDINE HCL 200 MG PO TABS: 200.0000 mg | ORAL_TABLET | Freq: Three times a day (TID) | ORAL | Status: DC

## 2013-07-25 MED ORDER — CEPHALEXIN 500 MG PO CAPS: 500.0000 mg | ORAL_CAPSULE | Freq: Four times a day (QID) | ORAL | Status: DC

## 2013-07-25 NOTE — ED Provider Notes (Signed)
CSN: [id number]     Arrival date & time 07/25/13  7829 History   First MD Initiated Contact with Patient 07/25/13 0356     Chief Complaint  Patient presents with  . Dysuria   (Consider location/radiation/quality/duration/timing/severity/associated sxs/prior Treatment) Patient is a 79 y.o. female presenting with dysuria. The history is provided by the patient.  Dysuria  patient here complaining of dysuria x 3 days. Diagnosed with UTI and has been on ciprofloxacin x24 hours without improvement of her symptoms. No associated fever or flank pain. No vomiting or diarrhea. Does have urinary urgency as well but denies any hematuria. Symptoms have been persistent and nothing makes them better but they are worse with urination.  Past Medical History  Diagnosis Date  . GERD (gastroesophageal reflux disease)   . Arthritis   . History of blood transfusion yrs ago   Past Surgical History  Procedure Laterality Date  . Abdominal hysterectomy      1 ovary removed  . Joint replacement    . Right hip replacement  1977  . Bilateral knee replacment  2008  . Total hip arthroplasty Left 06/04/2013    Procedure: LEFT TOTAL HIP ARTHROPLASTY ANTERIOR APPROACH;  Surgeon: Shelda Pal, MD;  Location: [facility];  Service: Orthopedics;  Laterality: Left;   No family history on file. History  Substance Use Topics  . Smoking status: Former Smoker -- 0.50 packs/day for 12 years    Types: Cigarettes  . Smokeless tobacco: Never Used     Comment: quit smoking age 39  . Alcohol Use: Yes     Comment: occasional wine   OB History   Grav Para Term Preterm Abortions TAB SAB Ect Mult Living                 Review of Systems  Genitourinary: Positive for dysuria.  All other systems reviewed and are negative.    Allergies  Sulfa antibiotics  Home Medications   Current Outpatient Rx  Name  Route  Sig  Dispense  Refill  . Ascorbic Acid (VITAMIN C) 100 MG tablet   Oral   Take 100 mg by mouth daily.          . cholecalciferol (VITAMIN D) 1000 UNITS tablet   Oral   Take 1,000 Units by mouth daily.         . ciprofloxacin (CIPRO) 500 MG tablet   Oral   Take 500 mg by mouth 2 (two) times daily. For 5 days only         . ferrous sulfate 325 (65 FE) MG tablet   Oral   Take 1 tablet (325 mg total) by mouth 3 (three) times daily after meals.      3   . FLUOROMETHOLONE OP   Both Eyes   Place 1 drop into both eyes daily.          . Multiple Vitamin (MULTIVITAMIN) tablet   Oral   Take 1 tablet by mouth daily.          BP 138/78  Pulse 80  Temp(Src) 97.5 F (36.4 C) (Oral)  Resp 18  Ht 5\' 3"  (1.6 m)  Wt 137 lb (62.143 kg)  BMI 24.27 kg/m2  SpO2 94% Physical Exam  Nursing note and vitals reviewed. Constitutional: She is oriented to person, place, and time. She appears well-developed and well-nourished.  Non-toxic appearance. No distress.  HENT:  Head: Normocephalic and atraumatic.  Eyes: Conjunctivae, EOM and lids are normal. Pupils are equal,  round, and reactive to light.  Neck: Normal range of motion. Neck supple. No tracheal deviation present. No mass present.  Cardiovascular: Normal rate, regular rhythm and normal heart sounds.  Exam reveals no gallop.   No murmur heard. Pulmonary/Chest: Effort normal and breath sounds normal. No stridor. No respiratory distress. She has no decreased breath sounds. She has no wheezes. She has no rhonchi. She has no rales.  Abdominal: Soft. Normal appearance and bowel sounds are normal. She exhibits no distension. There is no tenderness. There is no rigidity, no rebound, no guarding and no CVA tenderness.  Musculoskeletal: Normal range of motion. She exhibits no edema and no tenderness.  Neurological: She is alert and oriented to person, place, and time. She has normal strength. No cranial nerve deficit or sensory deficit. GCS eye subscore is 4. GCS verbal subscore is 5. GCS motor subscore is 6.  Skin: Skin is warm and dry. No abrasion  and no rash noted.  Psychiatric: She has a normal mood and affect. Her speech is normal and behavior is normal.    ED Course  Procedures (including critical care time) Labs Review Labs Reviewed  URINE CULTURE  URINALYSIS, ROUTINE W REFLEX MICROSCOPIC   Imaging Review No results found.  EKG Interpretation   None       MDM  No diagnosis found. Patient given Rocephin IM and dose of hydrocodone here feels better. We'll treat with Keflex and Pyridium    Toy Baker, MD 07/25/13 [phone]

## 2013-07-25 NOTE — ED Notes (Signed)
MD at bedside. 

## 2013-07-25 NOTE — ED Notes (Signed)
Pt c/o dysuria x 2-3 days, pt given Cipro yesterday. Pt continues to have discomfort

## 2013-07-26 LAB — URINE CULTURE

## 2013-07-30 DIAGNOSIS — M6281 Muscle weakness (generalized): Secondary | ICD-10-CM | POA: Diagnosis not present

## 2013-07-30 DIAGNOSIS — R279 Unspecified lack of coordination: Secondary | ICD-10-CM | POA: Diagnosis not present

## 2013-07-30 DIAGNOSIS — M175 Other unilateral secondary osteoarthritis of knee: Secondary | ICD-10-CM | POA: Diagnosis not present

## 2013-07-30 DIAGNOSIS — R269 Unspecified abnormalities of gait and mobility: Secondary | ICD-10-CM | POA: Diagnosis not present

## 2013-07-31 DIAGNOSIS — R279 Unspecified lack of coordination: Secondary | ICD-10-CM | POA: Diagnosis not present

## 2013-07-31 DIAGNOSIS — M175 Other unilateral secondary osteoarthritis of knee: Secondary | ICD-10-CM | POA: Diagnosis not present

## 2013-07-31 DIAGNOSIS — R269 Unspecified abnormalities of gait and mobility: Secondary | ICD-10-CM | POA: Diagnosis not present

## 2013-07-31 DIAGNOSIS — M6281 Muscle weakness (generalized): Secondary | ICD-10-CM | POA: Diagnosis not present

## 2013-08-01 DIAGNOSIS — Z87440 Personal history of urinary (tract) infections: Secondary | ICD-10-CM | POA: Diagnosis not present

## 2013-08-01 DIAGNOSIS — M1990 Unspecified osteoarthritis, unspecified site: Secondary | ICD-10-CM | POA: Diagnosis not present

## 2013-08-01 DIAGNOSIS — D500 Iron deficiency anemia secondary to blood loss (chronic): Secondary | ICD-10-CM | POA: Diagnosis not present

## 2013-08-01 DIAGNOSIS — Z1331 Encounter for screening for depression: Secondary | ICD-10-CM | POA: Diagnosis not present

## 2013-08-02 DIAGNOSIS — M175 Other unilateral secondary osteoarthritis of knee: Secondary | ICD-10-CM | POA: Diagnosis not present

## 2013-08-02 DIAGNOSIS — M6281 Muscle weakness (generalized): Secondary | ICD-10-CM | POA: Diagnosis not present

## 2013-08-02 DIAGNOSIS — R279 Unspecified lack of coordination: Secondary | ICD-10-CM | POA: Diagnosis not present

## 2013-08-02 DIAGNOSIS — R269 Unspecified abnormalities of gait and mobility: Secondary | ICD-10-CM | POA: Diagnosis not present

## 2013-08-05 DIAGNOSIS — M6281 Muscle weakness (generalized): Secondary | ICD-10-CM | POA: Diagnosis not present

## 2013-08-05 DIAGNOSIS — R279 Unspecified lack of coordination: Secondary | ICD-10-CM | POA: Diagnosis not present

## 2013-08-05 DIAGNOSIS — M175 Other unilateral secondary osteoarthritis of knee: Secondary | ICD-10-CM | POA: Diagnosis not present

## 2013-08-05 DIAGNOSIS — R269 Unspecified abnormalities of gait and mobility: Secondary | ICD-10-CM | POA: Diagnosis not present

## 2013-08-07 DIAGNOSIS — R279 Unspecified lack of coordination: Secondary | ICD-10-CM | POA: Diagnosis not present

## 2013-08-07 DIAGNOSIS — R269 Unspecified abnormalities of gait and mobility: Secondary | ICD-10-CM | POA: Diagnosis not present

## 2013-08-07 DIAGNOSIS — M6281 Muscle weakness (generalized): Secondary | ICD-10-CM | POA: Diagnosis not present

## 2013-08-07 DIAGNOSIS — M175 Other unilateral secondary osteoarthritis of knee: Secondary | ICD-10-CM | POA: Diagnosis not present

## 2013-08-09 DIAGNOSIS — R269 Unspecified abnormalities of gait and mobility: Secondary | ICD-10-CM | POA: Diagnosis not present

## 2013-08-09 DIAGNOSIS — R279 Unspecified lack of coordination: Secondary | ICD-10-CM | POA: Diagnosis not present

## 2013-08-09 DIAGNOSIS — M6281 Muscle weakness (generalized): Secondary | ICD-10-CM | POA: Diagnosis not present

## 2013-08-09 DIAGNOSIS — M175 Other unilateral secondary osteoarthritis of knee: Secondary | ICD-10-CM | POA: Diagnosis not present

## 2013-08-12 DIAGNOSIS — R279 Unspecified lack of coordination: Secondary | ICD-10-CM | POA: Diagnosis not present

## 2013-08-12 DIAGNOSIS — M175 Other unilateral secondary osteoarthritis of knee: Secondary | ICD-10-CM | POA: Diagnosis not present

## 2013-08-12 DIAGNOSIS — R269 Unspecified abnormalities of gait and mobility: Secondary | ICD-10-CM | POA: Diagnosis not present

## 2013-08-12 DIAGNOSIS — M6281 Muscle weakness (generalized): Secondary | ICD-10-CM | POA: Diagnosis not present

## 2013-08-13 DIAGNOSIS — R269 Unspecified abnormalities of gait and mobility: Secondary | ICD-10-CM | POA: Diagnosis not present

## 2013-08-13 DIAGNOSIS — R279 Unspecified lack of coordination: Secondary | ICD-10-CM | POA: Diagnosis not present

## 2013-08-13 DIAGNOSIS — M175 Other unilateral secondary osteoarthritis of knee: Secondary | ICD-10-CM | POA: Diagnosis not present

## 2013-08-13 DIAGNOSIS — M6281 Muscle weakness (generalized): Secondary | ICD-10-CM | POA: Diagnosis not present

## 2013-08-19 DIAGNOSIS — R279 Unspecified lack of coordination: Secondary | ICD-10-CM | POA: Diagnosis not present

## 2013-08-19 DIAGNOSIS — M175 Other unilateral secondary osteoarthritis of knee: Secondary | ICD-10-CM | POA: Diagnosis not present

## 2013-08-19 DIAGNOSIS — R269 Unspecified abnormalities of gait and mobility: Secondary | ICD-10-CM | POA: Diagnosis not present

## 2013-08-19 DIAGNOSIS — M6281 Muscle weakness (generalized): Secondary | ICD-10-CM | POA: Diagnosis not present

## 2013-08-21 DIAGNOSIS — R269 Unspecified abnormalities of gait and mobility: Secondary | ICD-10-CM | POA: Diagnosis not present

## 2013-08-21 DIAGNOSIS — R279 Unspecified lack of coordination: Secondary | ICD-10-CM | POA: Diagnosis not present

## 2013-08-21 DIAGNOSIS — M175 Other unilateral secondary osteoarthritis of knee: Secondary | ICD-10-CM | POA: Diagnosis not present

## 2013-08-21 DIAGNOSIS — M6281 Muscle weakness (generalized): Secondary | ICD-10-CM | POA: Diagnosis not present

## 2013-08-23 DIAGNOSIS — R279 Unspecified lack of coordination: Secondary | ICD-10-CM | POA: Diagnosis not present

## 2013-08-23 DIAGNOSIS — R269 Unspecified abnormalities of gait and mobility: Secondary | ICD-10-CM | POA: Diagnosis not present

## 2013-08-23 DIAGNOSIS — M175 Other unilateral secondary osteoarthritis of knee: Secondary | ICD-10-CM | POA: Diagnosis not present

## 2013-08-23 DIAGNOSIS — M6281 Muscle weakness (generalized): Secondary | ICD-10-CM | POA: Diagnosis not present

## 2013-09-19 DIAGNOSIS — M169 Osteoarthritis of hip, unspecified: Secondary | ICD-10-CM | POA: Diagnosis not present

## 2013-09-19 DIAGNOSIS — M1610 Unilateral primary osteoarthritis, unspecified hip: Secondary | ICD-10-CM | POA: Diagnosis not present

## 2013-09-19 DIAGNOSIS — Z96649 Presence of unspecified artificial hip joint: Secondary | ICD-10-CM | POA: Diagnosis not present

## 2013-09-24 DIAGNOSIS — M175 Other unilateral secondary osteoarthritis of knee: Secondary | ICD-10-CM | POA: Diagnosis not present

## 2013-09-24 DIAGNOSIS — R279 Unspecified lack of coordination: Secondary | ICD-10-CM | POA: Diagnosis not present

## 2013-09-24 DIAGNOSIS — M6281 Muscle weakness (generalized): Secondary | ICD-10-CM | POA: Diagnosis not present

## 2013-09-24 DIAGNOSIS — R269 Unspecified abnormalities of gait and mobility: Secondary | ICD-10-CM | POA: Diagnosis not present

## 2013-10-22 DIAGNOSIS — R279 Unspecified lack of coordination: Secondary | ICD-10-CM | POA: Diagnosis not present

## 2013-10-22 DIAGNOSIS — M6281 Muscle weakness (generalized): Secondary | ICD-10-CM | POA: Diagnosis not present

## 2013-10-22 DIAGNOSIS — M175 Other unilateral secondary osteoarthritis of knee: Secondary | ICD-10-CM | POA: Diagnosis not present

## 2013-10-22 DIAGNOSIS — R269 Unspecified abnormalities of gait and mobility: Secondary | ICD-10-CM | POA: Diagnosis not present

## 2013-10-24 DIAGNOSIS — R279 Unspecified lack of coordination: Secondary | ICD-10-CM | POA: Diagnosis not present

## 2013-10-24 DIAGNOSIS — R269 Unspecified abnormalities of gait and mobility: Secondary | ICD-10-CM | POA: Diagnosis not present

## 2013-10-24 DIAGNOSIS — M175 Other unilateral secondary osteoarthritis of knee: Secondary | ICD-10-CM | POA: Diagnosis not present

## 2013-10-24 DIAGNOSIS — M6281 Muscle weakness (generalized): Secondary | ICD-10-CM | POA: Diagnosis not present

## 2013-10-28 DIAGNOSIS — M6281 Muscle weakness (generalized): Secondary | ICD-10-CM | POA: Diagnosis not present

## 2013-10-28 DIAGNOSIS — M175 Other unilateral secondary osteoarthritis of knee: Secondary | ICD-10-CM | POA: Diagnosis not present

## 2013-10-28 DIAGNOSIS — R279 Unspecified lack of coordination: Secondary | ICD-10-CM | POA: Diagnosis not present

## 2013-10-28 DIAGNOSIS — R269 Unspecified abnormalities of gait and mobility: Secondary | ICD-10-CM | POA: Diagnosis not present

## 2013-10-31 DIAGNOSIS — R269 Unspecified abnormalities of gait and mobility: Secondary | ICD-10-CM | POA: Diagnosis not present

## 2013-10-31 DIAGNOSIS — R279 Unspecified lack of coordination: Secondary | ICD-10-CM | POA: Diagnosis not present

## 2013-10-31 DIAGNOSIS — M175 Other unilateral secondary osteoarthritis of knee: Secondary | ICD-10-CM | POA: Diagnosis not present

## 2013-10-31 DIAGNOSIS — M6281 Muscle weakness (generalized): Secondary | ICD-10-CM | POA: Diagnosis not present

## 2013-12-05 DIAGNOSIS — Z961 Presence of intraocular lens: Secondary | ICD-10-CM | POA: Diagnosis not present

## 2013-12-10 ENCOUNTER — Ambulatory Visit: Payer: Medicare Other

## 2013-12-20 ENCOUNTER — Ambulatory Visit: Payer: Medicare Other

## 2013-12-27 ENCOUNTER — Ambulatory Visit (INDEPENDENT_AMBULATORY_CARE_PROVIDER_SITE_OTHER): Payer: Medicare Other

## 2013-12-27 VITALS — BP 123/81 | HR 77 | Resp 16 | Ht 63.0 in | Wt 140.0 lb

## 2013-12-27 DIAGNOSIS — L84 Corns and callosities: Secondary | ICD-10-CM

## 2013-12-27 DIAGNOSIS — B351 Tinea unguium: Secondary | ICD-10-CM | POA: Diagnosis not present

## 2013-12-27 DIAGNOSIS — Q828 Other specified congenital malformations of skin: Secondary | ICD-10-CM

## 2013-12-27 NOTE — Patient Instructions (Signed)
Corns and Calluses Corns are small areas of thickened skin that usually occur on the top, sides, or tip of a toe. They contain a cone-shaped core with a point that can press on a nerve below. This causes pain. Calluses are areas of thickened skin that usually develop on hands, fingers, palms, soles of the feet, and heels. These are areas that experience frequent friction or pressure. CAUSES  Corns are usually the result of rubbing (friction) or pressure from shoes that are too tight or do not fit properly. Calluses are caused by repeated friction and pressure on the affected areas. SYMPTOMS  A hard growth on the skin.  Pain or tenderness under the skin.  Sometimes, redness and swelling.  Increased discomfort while wearing tight-fitting shoes. DIAGNOSIS  Your caregiver can usually tell what the problem is by doing a physical exam. TREATMENT  Removing the cause of the friction or pressure is usually the only treatment needed. However, sometimes medicines can be used to help soften the hardened, thickened areas. These medicines include salicylic acid plasters and 12% ammonium lactate lotion. These medicines should only be used under the direction of your caregiver. HOME CARE INSTRUCTIONS   Try to remove pressure from the affected area.  You may wear donut-shaped corn pads to protect your skin.  You may use a pumice stone or nonmetallic nail file to gently reduce the thickness of a corn.  Wear properly fitted footwear.  If you have calluses on the hands, wear gloves during activities that cause friction.  If you have diabetes, you should regularly examine your feet. Tell your caregiver if you notice any problems with your feet. SEEK IMMEDIATE MEDICAL CARE IF:   You have increased pain, swelling, redness, or warmth in the affected area.  Your corn or callus starts to drain fluid or bleeds.  You are not getting better, even with treatment. Document Released: 05/14/2004 Document  Revised: 10/31/2011 Document Reviewed: 04/05/2011 Exit[employer] Patient Information 2014 [employer]. Onychomycosis/Fungal Toenails  WHAT IS IT? An infection that lies within the keratin of your nail plate that is caused by a fungus.  WHY ME? Fungal infections affect all ages, sexes, races, and creeds.  There may be many factors that predispose you to a fungal infection such as age, coexisting medical conditions such as diabetes, or an autoimmune disease; stress, medications, fatigue, genetics, etc.  Bottom line: fungus thrives in a warm, moist environment and your shoes offer such a location.  IS IT CONTAGIOUS? Theoretically, yes.  You do not want to share shoes, nail clippers or files with someone who has fungal toenails.  Walking around barefoot in the same room or sleeping in the same bed is unlikely to transfer the organism.  It is important to realize, however, that fungus can spread easily from one nail to the next on the same foot.  HOW DO WE TREAT THIS?  There are several ways to treat this condition.  Treatment may depend on many factors such as age, medications, pregnancy, liver and kidney conditions, etc.  It is best to ask your doctor which options are available to you.  1. No treatment.   Unlike many other medical concerns, you can live with this condition.  However for many people this can be a painful condition and may lead to ingrown toenails or a bacterial infection.  It is recommended that you keep the nails cut short to help reduce the amount of fungal nail. 2. Topical treatment.  These range from herbal remedies to   herbal remedies to prescription strength nail lacquers.  About 40-50% effective, topicals require twice daily application for approximately 9 to 12 months or until an entirely new nail has grown out.  The most effective topicals are medical grade medications available through physicians offices. 3. Oral antifungal medications.  With an 80-90% cure rate, the most common oral medication  requires 3 to 4 months of therapy and stays in your system for a year as the new nail grows out.  Oral antifungal medications do require blood work to make sure it is a safe drug for you.  A liver function panel will be performed prior to starting the medication and after the first month of treatment.  It is important to have the blood work performed to avoid any harmful side effects.  In general, this medication safe but blood work is required. 4. Laser Therapy.  This treatment is performed by applying a specialized laser to the affected nail plate.  This therapy is noninvasive, fast, and non-painful.  It is not covered by insurance and is therefore, out of pocket.  The results have been very good with a 80-95% cure rate.  [facility] is the only practice in the area to offer this therapy. 5. Permanent Nail Avulsion.  Removing the entire nail so that a new nail will not grow back.

## 2013-12-27 NOTE — Progress Notes (Signed)
   Subjective:    Patient ID: Laura Knight, female    DOB: July 15, 1925, 80 y.o.   MRN: [id number]  HPI Comments: "I have some calluses and my toenails need to be cut"  Patient c/o painful calluses sub 1st and 5th MPJ left foot for several years. She also would like to get the toenails cut as well. Her feet get sore when walking a lot.      Review of Systems  Musculoskeletal: Positive for gait problem.  Allergic/Immunologic: Positive for environmental allergies.  All other systems reviewed and are negative.      Objective:   Physical Exam Vascular status is intact with pedal pulses palpable DP pulse two over four bilateral PT plus one over 4 bilateral capillary refill time 3 seconds all digits epicritic and proprioceptive sensations intact. Mild varicosities noted as well no edema number pallor noted neurologically epicritic and proprioceptive sensations intact bilateral normal plantar response and DTRs noted dermatologically skin color pigment normal hair growth absent nails thick brittle crumbly friable discolored and nonpainful or symptomatic no secondary infections. There is also keratotic lesion sub-1 and 5 left sub-first right is the most severe is painful and symptomatic secondary plantigrade metatarsal and atrophy of the plantar fat pad there is some diffuse keratoses on the right although these are asymptomatic and not severe.       Assessment & Plan:  Assessment this time is onychomycosis thick brittle crumbly friable mycotic nails are debrided at this time likely an uncovered service ABN form is given to patient also this time debridement of keratotic lesion single lesion sub-1 left radial nerve diffuse lesions again noncovered service likely followup in the future on an as-needed basis patient was explained made aware that trimming nails corns and calluses noncovered service without any significant class findings diabetes or peripheral vascular disease patient wished to proceed  with treatment at this time followup in the future as needed  Harriet Masson DPM

## 2014-06-30 DIAGNOSIS — H18233 Secondary corneal edema, bilateral: Secondary | ICD-10-CM | POA: Diagnosis not present

## 2014-08-06 DIAGNOSIS — R05 Cough: Secondary | ICD-10-CM | POA: Diagnosis not present

## 2014-08-06 DIAGNOSIS — R110 Nausea: Secondary | ICD-10-CM | POA: Diagnosis not present

## 2014-08-06 DIAGNOSIS — J069 Acute upper respiratory infection, unspecified: Secondary | ICD-10-CM | POA: Diagnosis not present

## 2014-08-07 ENCOUNTER — Emergency Department (HOSPITAL_COMMUNITY): Payer: Medicare Other

## 2014-08-07 ENCOUNTER — Emergency Department (HOSPITAL_COMMUNITY)
Admission: EM | Admit: 2014-08-07 | Discharge: 2014-08-07 | Disposition: A | Discharge status: Home / Self Care | Payer: Medicare Other | Attending: Emergency Medicine | Admitting: Emergency Medicine

## 2014-08-07 ENCOUNTER — Encounter (HOSPITAL_COMMUNITY): Payer: Self-pay | Admitting: Emergency Medicine

## 2014-08-07 DIAGNOSIS — Z880 Allergy status to penicillin: Secondary | ICD-10-CM | POA: Diagnosis not present

## 2014-08-07 DIAGNOSIS — R0789 Other chest pain: Secondary | ICD-10-CM

## 2014-08-07 DIAGNOSIS — R05 Cough: Secondary | ICD-10-CM | POA: Diagnosis not present

## 2014-08-07 DIAGNOSIS — M1990 Unspecified osteoarthritis, unspecified site: Secondary | ICD-10-CM | POA: Insufficient documentation

## 2014-08-07 DIAGNOSIS — Z79899 Other long term (current) drug therapy: Secondary | ICD-10-CM | POA: Diagnosis not present

## 2014-08-07 DIAGNOSIS — Z8719 Personal history of other diseases of the digestive system: Secondary | ICD-10-CM | POA: Insufficient documentation

## 2014-08-07 DIAGNOSIS — Z792 Long term (current) use of antibiotics: Secondary | ICD-10-CM | POA: Diagnosis not present

## 2014-08-07 DIAGNOSIS — J069 Acute upper respiratory infection, unspecified: Secondary | ICD-10-CM | POA: Diagnosis not present

## 2014-08-07 DIAGNOSIS — J029 Acute pharyngitis, unspecified: Secondary | ICD-10-CM | POA: Diagnosis present

## 2014-08-07 DIAGNOSIS — Z87891 Personal history of nicotine dependence: Secondary | ICD-10-CM | POA: Diagnosis not present

## 2014-08-07 DIAGNOSIS — R030 Elevated blood-pressure reading, without diagnosis of hypertension: Secondary | ICD-10-CM | POA: Diagnosis not present

## 2014-08-07 DIAGNOSIS — J9811 Atelectasis: Secondary | ICD-10-CM | POA: Diagnosis not present

## 2014-08-07 LAB — URINALYSIS, ROUTINE W REFLEX MICROSCOPIC
Bilirubin Urine: NEGATIVE
GLUCOSE, UA: NEGATIVE mg/dL
Hgb urine dipstick: NEGATIVE
Ketones, ur: NEGATIVE mg/dL
Nitrite: NEGATIVE
Protein, ur: NEGATIVE mg/dL
Specific Gravity, Urine: 1.015 (ref 1.005–1.030)
UROBILINOGEN UA: 0.2 mg/dL (ref 0.0–1.0)
pH: 5.5 (ref 5.0–8.0)

## 2014-08-07 LAB — URINE MICROSCOPIC-ADD ON

## 2014-08-07 LAB — RAPID STREP SCREEN (MED CTR MEBANE ONLY): STREPTOCOCCUS, GROUP A SCREEN (DIRECT): NEGATIVE

## 2014-08-07 MED ORDER — BENZONATATE 100 MG PO CAPS
100.0000 mg | ORAL_CAPSULE | Freq: Once | ORAL | Status: AC
  Administered 2014-08-07: 100 mg via ORAL
  Filled 2014-08-07: qty 1

## 2014-08-07 MED ORDER — BENZONATATE 100 MG PO CAPS: 100.0000 mg | ORAL_CAPSULE | Freq: Three times a day (TID) | ORAL | Status: DC

## 2014-08-07 NOTE — ED Notes (Signed)
Patient presents from [place] via EMS for sore throat and chest soreness x1 week. Patient seen at Urgent Care recently for same, prescribed medications without relief. Patient requesting Chest Xray.  VS: 176/76BP, 96Hr, 95%RA, 16 Resp

## 2014-08-07 NOTE — ED Provider Notes (Signed)
CSN: [id number]     Arrival date & time 08/07/14  0134 History   First MD Initiated Contact with Patient 08/07/14 [id number]     Chief Complaint  Patient presents with  . Sore Throat     (Consider location/radiation/quality/duration/timing/severity/associated sxs/prior Treatment) HPI Comments: The patient is a 80 year old female presenting from [place] with a chief complaint of cough, sore throat, nasal congestion for several days. Patient reports recently seen at urgent care today and prescribed Mucinex, Bactrim for a upper respiratory infection. Reports chest discomfort described as discomfort with swallowing. Patient reports subjective fever. Multiple family members with similar symptoms. PCP Dr. Ernie Hew  Patient is a 80 y.o. female presenting with pharyngitis. The history is provided by the patient. No language interpreter was used.  Sore Throat Associated symptoms include congestion, coughing and a sore throat.    Past Medical History  Diagnosis Date  . GERD (gastroesophageal reflux disease)   . Arthritis   . History of blood transfusion yrs ago   Past Surgical History  Procedure Laterality Date  . Abdominal hysterectomy      1 ovary removed  . Joint replacement    . Right hip replacement  1977  . Bilateral knee replacment  2008  . Total hip arthroplasty Left 06/04/2013    Procedure: LEFT TOTAL HIP ARTHROPLASTY ANTERIOR APPROACH;  Surgeon: Mauri Pole, MD;  Location: [facility];  Service: Orthopedics;  Laterality: Left;   No family history on file. History  Substance Use Topics  . Smoking status: Former Smoker -- 0.50 packs/day for 12 years    Types: Cigarettes  . Smokeless tobacco: Never Used     Comment: quit smoking age 34  . Alcohol Use: Yes     Comment: occasional wine   OB History    No data available     Review of Systems  HENT: Positive for congestion, rhinorrhea and sore throat.   Respiratory: Positive for cough.       Allergies  Amoxicillin and Sulfa  antibiotics  Home Medications   Prior to Admission medications   Medication Sig Start Date End Date Taking? Authorizing Provider  dextromethorphan 15 MG/5ML syrup Take 10 mLs by mouth 4 (four) times daily as needed for cough.   Yes Historical Provider, MD  guaiFENesin (MUCINEX) 600 MG 12 hr tablet Take 600 mg by mouth 2 (two) times daily.   Yes Historical Provider, MD  Multiple Vitamin (MULTIVITAMIN) tablet Take 1 tablet by mouth daily.   Yes Historical Provider, MD  sulfamethoxazole-trimethoprim (BACTRIM DS,SEPTRA DS) 800-160 MG per tablet Take 1 tablet by mouth 2 (two) times daily.   Yes Historical Provider, MD  cephALEXin (KEFLEX) 500 MG capsule Take 1 capsule (500 mg total) by mouth 4 (four) times daily. Patient not taking: Reported on 08/07/2014 07/25/13   Leota Jacobsen, MD  ferrous sulfate 325 (65 FE) MG tablet Take 1 tablet (325 mg total) by mouth 3 (three) times daily after meals. Patient not taking: Reported on 08/07/2014 06/06/13   Lucille Passy Babish, PA-C  HYDROcodone-acetaminophen (NORCO/VICODIN) 5-325 MG per tablet Take 1-2 tablets by mouth every 4 (four) hours as needed. Patient not taking: Reported on 08/07/2014 07/25/13   Leota Jacobsen, MD  phenazopyridine (PYRIDIUM) 200 MG tablet Take 1 tablet (200 mg total) by mouth 3 (three) times daily. Patient not taking: Reported on 08/07/2014 07/25/13   Leota Jacobsen, MD   BP 134/69 mmHg  Pulse 88  Temp(Src) 99.7 F (37.6 C) (Oral)  Resp 16  SpO2 93% Physical Exam  Constitutional: She is oriented to person, place, and time. She appears well-developed and well-nourished. No distress.  HENT:  Head: Normocephalic and atraumatic.  Nose: Rhinorrhea present.  Mouth/Throat: Uvula is midline and oropharynx is clear and moist. No oropharyngeal exudate or posterior oropharyngeal edema.  Eyes: EOM are normal. Pupils are equal, round, and reactive to light.  Neck: Neck supple.  Cardiovascular: Normal rate and regular rhythm.    Pulmonary/Chest: Effort normal and breath sounds normal. No respiratory distress. She has no wheezes. She has no rales.  Lymphadenopathy:       Head (right side): Tonsillar adenopathy present.       Head (left side): Tonsillar adenopathy present.  Mild tenderness with palpation.  Neurological: She is alert and oriented to person, place, and time.  Skin: Skin is warm and dry. She is not diaphoretic.  Psychiatric: She has a normal mood and affect. Her behavior is normal.  Nursing note and vitals reviewed.   ED Course  Procedures (including critical care time) Labs Review Labs Reviewed  URINALYSIS, ROUTINE W REFLEX MICROSCOPIC - Abnormal; Notable for the following:    Leukocytes, UA TRACE (*)    All other components within normal limits  RAPID STREP SCREEN  CULTURE, GROUP A STREP  URINE MICROSCOPIC-ADD ON    Imaging Review Dg Chest 2 View  08/07/2014   CLINICAL DATA:  Cough, chest discomfort.  EXAM: CHEST  2 VIEW  COMPARISON:  None.  FINDINGS: The heart size and mediastinal contours are within normal limits. No pneumothorax or pleural effusion is noted. Minimal lingular opacity is noted most consistent with subsegmental atelectasis. Right lung is clear. Degenerative changes seen involving both acromioclavicular joints.  IMPRESSION: Minimal lingular subsegmental atelectasis.   Electronically Signed   By: Sabino Dick M.D.   On: 08/07/2014 03:09     EKG Interpretation None      MDM   Final diagnoses:  URI, acute   Patient presents with URI-like symptoms, negative chest x-ray negative rapid strep. Patient has fever of 99.7 plan to recheck and check a urine. UA without sign of infection. Discussed patient history, condition with Dr. Ashok Cordia on also evaluated the patient during this encounter, agrees with impression, follow-up with PCP.    Harvie Heck, PA-C 08/07/14 2217  Mirna Mires, MD 08/08/14 [phone]

## 2014-08-07 NOTE — Discharge Instructions (Signed)
Call for a follow up appointment with a Family or Primary Care Provider.  °Return if Symptoms worsen.   °Take medication as prescribed.  ° °

## 2014-08-08 LAB — CULTURE, GROUP A STREP

## 2014-08-10 ENCOUNTER — Emergency Department (HOSPITAL_COMMUNITY)
Admission: EM | Admit: 2014-08-10 | Discharge: 2014-08-10 | Disposition: A | Discharge status: Home / Self Care | Payer: Medicare Other | Attending: Emergency Medicine | Admitting: Emergency Medicine

## 2014-08-10 ENCOUNTER — Encounter (HOSPITAL_COMMUNITY): Payer: Self-pay | Admitting: Emergency Medicine

## 2014-08-10 DIAGNOSIS — J029 Acute pharyngitis, unspecified: Secondary | ICD-10-CM | POA: Diagnosis not present

## 2014-08-10 DIAGNOSIS — Z79899 Other long term (current) drug therapy: Secondary | ICD-10-CM | POA: Insufficient documentation

## 2014-08-10 DIAGNOSIS — Z792 Long term (current) use of antibiotics: Secondary | ICD-10-CM | POA: Diagnosis not present

## 2014-08-10 DIAGNOSIS — J069 Acute upper respiratory infection, unspecified: Secondary | ICD-10-CM | POA: Diagnosis not present

## 2014-08-10 DIAGNOSIS — R05 Cough: Secondary | ICD-10-CM | POA: Diagnosis present

## 2014-08-10 DIAGNOSIS — R8299 Other abnormal findings in urine: Secondary | ICD-10-CM | POA: Diagnosis not present

## 2014-08-10 DIAGNOSIS — M1990 Unspecified osteoarthritis, unspecified site: Secondary | ICD-10-CM | POA: Insufficient documentation

## 2014-08-10 DIAGNOSIS — Z8719 Personal history of other diseases of the digestive system: Secondary | ICD-10-CM | POA: Diagnosis not present

## 2014-08-10 DIAGNOSIS — Z87891 Personal history of nicotine dependence: Secondary | ICD-10-CM | POA: Diagnosis not present

## 2014-08-10 DIAGNOSIS — H6123 Impacted cerumen, bilateral: Secondary | ICD-10-CM | POA: Diagnosis not present

## 2014-08-10 DIAGNOSIS — Z880 Allergy status to penicillin: Secondary | ICD-10-CM | POA: Diagnosis not present

## 2014-08-10 DIAGNOSIS — R059 Cough, unspecified: Secondary | ICD-10-CM

## 2014-08-10 DIAGNOSIS — R531 Weakness: Secondary | ICD-10-CM | POA: Diagnosis not present

## 2014-08-10 DIAGNOSIS — R82998 Other abnormal findings in urine: Secondary | ICD-10-CM

## 2014-08-10 DIAGNOSIS — R404 Transient alteration of awareness: Secondary | ICD-10-CM | POA: Diagnosis not present

## 2014-08-10 LAB — URINALYSIS, ROUTINE W REFLEX MICROSCOPIC
BILIRUBIN URINE: NEGATIVE
Glucose, UA: NEGATIVE mg/dL
HGB URINE DIPSTICK: NEGATIVE
Ketones, ur: 15 mg/dL — AB
Leukocytes, UA: NEGATIVE
Nitrite: NEGATIVE
PROTEIN: NEGATIVE mg/dL
Specific Gravity, Urine: 1.021 (ref 1.005–1.030)
UROBILINOGEN UA: 0.2 mg/dL (ref 0.0–1.0)
pH: 5.5 (ref 5.0–8.0)

## 2014-08-10 LAB — URINE MICROSCOPIC-ADD ON

## 2014-08-10 MED ORDER — CARBAMIDE PEROXIDE 6.5 % OT SOLN: 5.0000 [drp] | Freq: Two times a day (BID) | OTIC | Status: DC

## 2014-08-10 NOTE — Discharge Instructions (Signed)
Continue to stay well-hydrated. Gargle warm salt water and spit it out. May use sucrets or chloraseptic lozenges to help with sore throat. Continue to alternate between Tylenol and Ibuprofen for pain or fever. May consider over-the-counter antihistamine to decrease secretions and for watery itchy eyes. Continue taking your home medications and antibiotic, the hospital will call you if your urine culture reports are abnormal. Use debrox drops as directed to help with your ear wax. Followup with your primary care doctor in 5-7 days for recheck of ongoing symptoms. Return to emergency department for emergent changing or worsening of symptoms.   Cough, Adult  A cough is a reflex that helps clear your throat and airways. It can help heal the body or may be a reaction to an irritated airway. A cough may only last 2 or 3 weeks (acute) or may last more than 8 weeks (chronic).  CAUSES Acute cough:  Viral or bacterial infections. Chronic cough:  Infections.  Allergies.  Asthma.  Post-nasal drip.  Smoking.  Heartburn or acid reflux.  Some medicines.  Chronic lung problems (COPD).  Cancer. SYMPTOMS   Cough.  Fever.  Chest pain.  Increased breathing rate.  High-pitched whistling sound when breathing (wheezing).  Colored mucus that you cough up (sputum). TREATMENT   A bacterial cough may be treated with antibiotic medicine.  A viral cough must run its course and will not respond to antibiotics.  Your caregiver may recommend other treatments if you have a chronic cough. HOME CARE INSTRUCTIONS   Only take over-the-counter or prescription medicines for pain, discomfort, or fever as directed by your caregiver. Use cough suppressants only as directed by your caregiver.  Use a cold steam vaporizer or humidifier in your bedroom or home to help loosen secretions.  Sleep in a semi-upright position if your cough is worse at night.  Rest as needed.  Stop smoking if you smoke. SEEK  IMMEDIATE MEDICAL CARE IF:   You have pus in your sputum.  Your cough starts to worsen.  You cannot control your cough with suppressants and are losing sleep.  You begin coughing up blood.  You have difficulty breathing.  You develop pain which is getting worse or is uncontrolled with medicine.  You have a fever. MAKE SURE YOU:   Understand these instructions.  Will watch your condition.  Will get help right away if you are not doing well or get worse. Document Released: 02/04/2011 Document Revised: 10/31/2011 Document Reviewed: 02/04/2011 [facility] Patient Information 2015 [place], [place]. This information is not intended to replace advice given to you by your health care provider. Make sure you discuss any questions you have with your health care provider.  Cerumen Impaction A cerumen impaction is when the wax in your ear forms a plug. This plug usually causes reduced hearing. Sometimes it also causes an earache or dizziness. Removing a cerumen impaction can be difficult and painful. The wax sticks to the ear canal. The canal is sensitive and bleeds easily. If you try to remove a heavy wax buildup with a cotton tipped swab, you may push it in further. Irrigation with water, suction, and small ear curettes may be used to clear out the wax. If the impaction is fixed to the skin in the ear canal, ear drops may be needed for a few days to loosen the wax. People who build up a lot of wax frequently can use ear wax removal products available in your local drugstore. SEEK MEDICAL CARE IF:  You develop an  earache, increased hearing loss, or marked dizziness. Document Released: 09/15/2004 Document Revised: 10/31/2011 Document Reviewed: 11/05/2009 [facility] Patient Information 2015 [place], [place]. This information is not intended to replace advice given to you by your health care provider. Make sure you discuss any questions you have with your health care provider.  Upper Respiratory  Infection, Adult An upper respiratory infection (URI) is also sometimes known as the common cold. The upper respiratory tract includes the nose, sinuses, throat, trachea, and bronchi. Bronchi are the airways leading to the lungs. Most people improve within 1 week, but symptoms can last up to 2 weeks. A residual cough may last even longer.  CAUSES Many different viruses can infect the tissues lining the upper respiratory tract. The tissues become irritated and inflamed and often become very moist. Mucus production is also common. A cold is contagious. You can easily spread the virus to others by oral contact. This includes kissing, sharing a glass, coughing, or sneezing. Touching your mouth or nose and then touching a surface, which is then touched by another person, can also spread the virus. SYMPTOMS  Symptoms typically develop 1 to 3 days after you come in contact with a cold virus. Symptoms vary from person to person. They may include:  Runny nose.  Sneezing.  Nasal congestion.  Sinus irritation.  Sore throat.  Loss of voice (laryngitis).  Cough.  Fatigue.  Muscle aches.  Loss of appetite.  Headache.  Low-grade fever. DIAGNOSIS  You might diagnose your own cold based on familiar symptoms, since most people get a cold 2 to 3 times a year. Your caregiver can confirm this based on your exam. Most importantly, your caregiver can check that your symptoms are not due to another disease such as strep throat, sinusitis, pneumonia, asthma, or epiglottitis. Blood tests, throat tests, and X-rays are not necessary to diagnose a common cold, but they may sometimes be helpful in excluding other more serious diseases. Your caregiver will decide if any further tests are required. RISKS AND COMPLICATIONS  You may be at risk for a more severe case of the common cold if you smoke cigarettes, have chronic heart disease (such as heart failure) or lung disease (such as asthma), or if you have a  weakened immune system. The very young and very old are also at risk for more serious infections. Bacterial sinusitis, middle ear infections, and bacterial pneumonia can complicate the common cold. The common cold can worsen asthma and chronic obstructive pulmonary disease (COPD). Sometimes, these complications can require emergency medical care and may be life-threatening. PREVENTION  The best way to protect against getting a cold is to practice good hygiene. Avoid oral or hand contact with people with cold symptoms. Wash your hands often if contact occurs. There is no clear evidence that vitamin C, vitamin E, echinacea, or exercise reduces the chance of developing a cold. However, it is always recommended to get plenty of rest and practice good nutrition. TREATMENT  Treatment is directed at relieving symptoms. There is no cure. Antibiotics are not effective, because the infection is caused by a virus, not by bacteria. Treatment may include:  Increased fluid intake. Sports drinks offer valuable electrolytes, sugars, and fluids.  Breathing heated mist or steam (vaporizer or shower).  Eating chicken soup or other clear broths, and maintaining good nutrition.  Getting plenty of rest.  Using gargles or lozenges for comfort.  Controlling fevers with ibuprofen or acetaminophen as directed by your caregiver.  Increasing usage of your inhaler if you  have asthma. Zinc gel and zinc lozenges, taken in the first 24 hours of the common cold, can shorten the duration and lessen the severity of symptoms. Pain medicines may help with fever, muscle aches, and throat pain. A variety of non-prescription medicines are available to treat congestion and runny nose. Your caregiver can make recommendations and may suggest nasal or lung inhalers for other symptoms.  HOME CARE INSTRUCTIONS   Only take over-the-counter or prescription medicines for pain, discomfort, or fever as directed by your caregiver.  Use a warm  mist humidifier or inhale steam from a shower to increase air moisture. This may keep secretions moist and make it easier to breathe.  Drink enough water and fluids to keep your urine clear or pale yellow.  Rest as needed.  Return to work when your temperature has returned to normal or as your caregiver advises. You may need to stay home longer to avoid infecting others. You can also use a face mask and careful hand washing to prevent spread of the virus. SEEK MEDICAL CARE IF:   After the first few days, you feel you are getting worse rather than better.  You need your caregiver's advice about medicines to control symptoms.  You develop chills, worsening shortness of breath, or brown or red sputum. These may be signs of pneumonia.  You develop yellow or brown nasal discharge or pain in the face, especially when you bend forward. These may be signs of sinusitis.  You develop a fever, swollen neck glands, pain with swallowing, or white areas in the back of your throat. These may be signs of strep throat. SEEK IMMEDIATE MEDICAL CARE IF:   You have a fever.  You develop severe or persistent headache, ear pain, sinus pain, or chest pain.  You develop wheezing, a prolonged cough, cough up blood, or have a change in your usual mucus (if you have chronic lung disease).  You develop sore muscles or a stiff neck. Document Released: 02/01/2001 Document Revised: 10/31/2011 Document Reviewed: 11/13/2013 [facility] Patient Information 2015 [place], [place]. This information is not intended to replace advice given to you by your health care provider. Make sure you discuss any questions you have with your health care provider.  Salt Water Gargle This solution will help make your mouth and throat feel better. HOME CARE INSTRUCTIONS   Mix 1 teaspoon of salt in 8 ounces of warm water.  Gargle with this solution as much or often as you need or as directed. Swish and gargle gently if you have any sores  or wounds in your mouth.  Do not swallow this mixture. Document Released: 05/12/2004 Document Revised: 10/31/2011 Document Reviewed: 10/03/2008 [facility] Patient Information 2015 [place], [place]. This information is not intended to replace advice given to you by your health care provider. Make sure you discuss any questions you have with your health care provider.

## 2014-08-10 NOTE — ED Notes (Signed)
Bed: JD55 Expected date: 08/10/14 Expected time: 8:56 AM Means of arrival: Ambulance Comments: URI seen on 17th

## 2014-08-10 NOTE — ED Notes (Addendum)
Pt from  [place] [facility] via [place] c/o cough, congestion, generalized body aches. Pt was seen on the 12/17 here and diagnosed with URI patient reports not getting better. Pt also c/o dysuria and frequent urination. Urine appears cloudy.

## 2014-08-10 NOTE — ED Provider Notes (Signed)
CSN: [id number]     Arrival date & time 08/10/14  7494 History   First MD Initiated Contact with Patient 08/10/14 0913     Chief Complaint  Patient presents with  . Cough  . Generalized Body Aches  . Dysuria     (Consider location/radiation/quality/duration/timing/severity/associated sxs/prior Treatment) HPI Comments: Laura Knight is a 80 y.o. female with a PMHx of GERD and arthritis, coming via EMS from [place], who presents to the ED with complaints of URI symptoms x3 days. Patient states that she was seen at urgent care and then at the emergency room on 08/07/14 and diagnosed with an upper respiratory infection, given Mucinex and Bactrim, and sent home. She reports that since then she has had ongoing dry cough, sore throat, and body aches with generalized malaise/fatigue. She reports that these symptoms have not changed, and in fact her rhinorrhea has cleared up and she no longer has any fevers, but given that she is still having symptoms she return to the emergency room today. Endorses continuation of dysuria and malodorous urine, without hematuria. She denies any fevers, chills, ear pain or drainage, chest pain, shortness of breath, abdominal pain, nausea, vomiting, diarrhea, constipation, arthralgias, paresthesias, weakness, or numbness.  Chart review reveals that she had a neg CXR and neg RST, as well as a negative U/A on 08/07/14. PCP Dr. Ernie Hew  Patient is a 80 y.o. female presenting with URI. The history is provided by the patient. No language interpreter was used.  URI Presenting symptoms: cough (dry nonproductive), fatigue and sore throat   Presenting symptoms: no congestion, no ear pain, no facial pain, no fever and no rhinorrhea   Severity:  Moderate Onset quality:  Gradual Duration:  3 days Timing:  Constant Progression:  Unchanged Chronicity:  New Relieved by:  OTC medications (OTC cold medications) Worsened by:  Nothing tried Ineffective treatments:  None  tried Associated symptoms: myalgias (generalized body aches)   Associated symptoms: no arthralgias, no headaches, no neck pain, no sinus pain, no sneezing and no wheezing   Risk factors: being elderly and sick contacts     Past Medical History  Diagnosis Date  . GERD (gastroesophageal reflux disease)   . Arthritis   . History of blood transfusion yrs ago   Past Surgical History  Procedure Laterality Date  . Abdominal hysterectomy      1 ovary removed  . Joint replacement    . Right hip replacement  1977  . Bilateral knee replacment  2008  . Total hip arthroplasty Left 06/04/2013    Procedure: LEFT TOTAL HIP ARTHROPLASTY ANTERIOR APPROACH;  Surgeon: Mauri Pole, MD;  Location: [facility];  Service: Orthopedics;  Laterality: Left;   No family history on file. History  Substance Use Topics  . Smoking status: Former Smoker -- 0.50 packs/day for 12 years    Types: Cigarettes  . Smokeless tobacco: Never Used     Comment: quit smoking age 62  . Alcohol Use: Yes     Comment: occasional wine   OB History    No data available     Review of Systems  Constitutional: Positive for fatigue. Negative for fever and chills.  HENT: Positive for sore throat. Negative for congestion, ear discharge, ear pain, rhinorrhea, sinus pressure and sneezing.   Eyes: Negative for redness and itching.  Respiratory: Positive for cough (dry nonproductive). Negative for shortness of breath and wheezing.   Cardiovascular: Negative for chest pain.  Gastrointestinal: Negative for nausea, vomiting, abdominal pain, diarrhea and  constipation.  Genitourinary: Positive for dysuria. Negative for flank pain, vaginal bleeding and vaginal discharge.       +malodorous urine  Musculoskeletal: Positive for myalgias (generalized body aches). Negative for back pain, arthralgias and neck pain.  Skin: Negative for rash.  Neurological: Negative for weakness, numbness and headaches.  Psychiatric/Behavioral: Negative for  confusion.   10 Systems reviewed and are negative for acute change except as noted in the HPI.    Allergies  Amoxicillin and Sulfa antibiotics  Home Medications   Prior to Admission medications   Medication Sig Start Date End Date Taking? Authorizing Provider  benzonatate (TESSALON) 100 MG capsule Take 1 capsule (100 mg total) by mouth every 8 (eight) hours. 08/07/14   Harvie Heck, PA-C  cephALEXin (KEFLEX) 500 MG capsule Take 1 capsule (500 mg total) by mouth 4 (four) times daily. Patient not taking: Reported on 08/07/2014 07/25/13   Leota Jacobsen, MD  dextromethorphan 15 MG/5ML syrup Take 10 mLs by mouth 4 (four) times daily as needed for cough.    Historical Provider, MD  ferrous sulfate 325 (65 FE) MG tablet Take 1 tablet (325 mg total) by mouth 3 (three) times daily after meals. Patient not taking: Reported on 08/07/2014 06/06/13   Lucille Passy Babish, PA-C  guaiFENesin (MUCINEX) 600 MG 12 hr tablet Take 600 mg by mouth 2 (two) times daily.    Historical Provider, MD  HYDROcodone-acetaminophen (NORCO/VICODIN) 5-325 MG per tablet Take 1-2 tablets by mouth every 4 (four) hours as needed. Patient not taking: Reported on 08/07/2014 07/25/13   Leota Jacobsen, MD  Multiple Vitamin (MULTIVITAMIN) tablet Take 1 tablet by mouth daily.    Historical Provider, MD  phenazopyridine (PYRIDIUM) 200 MG tablet Take 1 tablet (200 mg total) by mouth 3 (three) times daily. Patient not taking: Reported on 08/07/2014 07/25/13   Leota Jacobsen, MD  sulfamethoxazole-trimethoprim (BACTRIM DS,SEPTRA DS) 800-160 MG per tablet Take 1 tablet by mouth 2 (two) times daily.    Historical Provider, MD   BP 136/70 mmHg  Pulse 85  Temp(Src) 98.2 F (36.8 C) (Oral)  Resp 15  SpO2 99% Physical Exam  Constitutional: She is oriented to person, place, and time. Vital signs are normal. She appears well-developed and well-nourished.  Non-toxic appearance. No distress.  Afebrile, nontoxic, NAD  HENT:  Head:  Normocephalic and atraumatic.  Right Ear: Hearing and external ear normal.  Left Ear: Hearing, tympanic membrane, external ear and ear canal normal. Tympanic membrane is not injected, not erythematous and not bulging.  No middle ear effusion.  Nose: Nose normal.  Mouth/Throat: Uvula is midline, oropharynx is clear and moist and mucous membranes are normal. No trismus in the jaw. No uvula swelling.  R ear with cerumen impaction L ear with mild cerumen, TM clear Nose without rhinorrhea or edematous turbinates Oropharynx clear and moist, no tonsillar swelling or exudates  Eyes: Conjunctivae and EOM are normal. Right eye exhibits no discharge. Left eye exhibits no discharge.  Neck: Normal range of motion. Neck supple.  Cardiovascular: Normal rate, regular rhythm, normal heart sounds and intact distal pulses.  Exam reveals no gallop and no friction rub.   No murmur heard. Pulmonary/Chest: Effort normal and breath sounds normal. No respiratory distress. She has no decreased breath sounds. She has no wheezes. She has no rhonchi. She has no rales.  CTAB in all lung fields, no w/r/r  Abdominal: Soft. Normal appearance and bowel sounds are normal. She exhibits no distension. There is no tenderness. There  is no rigidity, no rebound, no guarding and no CVA tenderness.  Soft, NTND, +BS throughout, no r/g/r, no CVA TTP  Musculoskeletal: Normal range of motion.  Baseline ROM, strength, and sensation in all extremities  Lymphadenopathy:    She has cervical adenopathy.  Shotty anterior cervical LAD, nonTTP  Neurological: She is alert and oriented to person, place, and time. She has normal strength. No sensory deficit.  Skin: Skin is warm, dry and intact. No rash noted.  Psychiatric: She has a normal mood and affect.  Nursing note and vitals reviewed.   ED Course  Procedures (including critical care time) Labs Review Labs Reviewed  URINALYSIS, ROUTINE W REFLEX MICROSCOPIC - Abnormal; Notable for the  following:    APPearance TURBID (*)    Ketones, ur 15 (*)    All other components within normal limits  URINE MICROSCOPIC-ADD ON - Abnormal; Notable for the following:    Crystals URIC ACID CRYSTALS (*)    All other components within normal limits  URINE CULTURE   Imaging Review No results found.   Dg Chest 2 View  08/07/2014   CLINICAL DATA:  Cough, chest discomfort.  EXAM: CHEST  2 VIEW  COMPARISON:  None.  FINDINGS: The heart size and mediastinal contours are within normal limits. No pneumothorax or pleural effusion is noted. Minimal lingular opacity is noted most consistent with subsegmental atelectasis. Right lung is clear. Degenerative changes seen involving both acromioclavicular joints.  IMPRESSION: Minimal lingular subsegmental atelectasis.   Electronically Signed   By: Sabino Dick M.D.   On: 08/07/2014 03:09      EKG Interpretation None      MDM   Final diagnoses:  URI (upper respiratory infection)  Cough  Sore throat  Cerumen impaction, bilateral  Uric acid crystalluria    80 y.o. female with URI symptoms. Discussed that this is likely viral or allergic, and since she's already on bactrim that would cover for bacterial etiologies. Since she's had ongoing dysuria/cloudy urine, will obtain U/A and culture. Possibly switch to keflex if U/A appears to have infection.  10:26 AM U/A without evidence of UTI, showing uric acid crystals. Will stay on bactrim, and have them f/up with culture. Discussed symptomatic control with close PCP f/up. Discussed use of debrox drops for cerumen impactions. Dr. Reather Converse and I discussed case and he saw pt, agrees with plan. I explained the diagnosis and have given explicit precautions to return to the ER including for any other new or worsening symptoms. The patient understands and accepts the medical plan as it's been dictated and I have answered their questions. Discharge instructions concerning home care and prescriptions have been given.  The patient is STABLE and is discharged to home in good condition.  BP 136/70 mmHg  Pulse 85  Temp(Src) 98.2 F (36.8 C) (Oral)  Resp 15  SpO2 99%  Meds ordered this encounter  Medications  . carbamide peroxide (DEBROX) 6.5 % otic solution    Sig: Place 5 drops into both ears 2 (two) times daily. x5 days    Dispense:  15 mL    Refill:  1    Order Specific Question:  Supervising Provider    Answer:  Johnna Acosta [address] [place], PA-C 08/10/14 1027  Mariea Clonts, MD 08/10/14 1534

## 2014-08-10 NOTE — ED Notes (Signed)
MD Zavitz at bedside  

## 2014-08-10 NOTE — ED Notes (Signed)
Pt being DC with son who will take her back to [facility]

## 2014-08-10 NOTE — ED Notes (Signed)
Pt assisted to restroom with steady by NT Eulas Post.

## 2014-08-12 LAB — URINE CULTURE
Colony Count: NO GROWTH
Culture: NO GROWTH
Special Requests: NORMAL

## 2014-08-20 DIAGNOSIS — Z1389 Encounter for screening for other disorder: Secondary | ICD-10-CM | POA: Diagnosis not present

## 2014-08-20 DIAGNOSIS — H6123 Impacted cerumen, bilateral: Secondary | ICD-10-CM | POA: Diagnosis not present

## 2014-08-20 DIAGNOSIS — M533 Sacrococcygeal disorders, not elsewhere classified: Secondary | ICD-10-CM | POA: Diagnosis not present

## 2014-08-20 DIAGNOSIS — J069 Acute upper respiratory infection, unspecified: Secondary | ICD-10-CM | POA: Diagnosis not present

## 2014-08-21 DIAGNOSIS — H6122 Impacted cerumen, left ear: Secondary | ICD-10-CM | POA: Diagnosis not present

## 2014-08-21 DIAGNOSIS — J3089 Other allergic rhinitis: Secondary | ICD-10-CM | POA: Diagnosis not present

## 2014-09-09 ENCOUNTER — Ambulatory Visit (HOSPITAL_COMMUNITY)
Admission: RE | Admit: 2014-09-09 | Discharge: 2014-09-09 | Disposition: A | Discharge status: Home / Self Care | Payer: Medicare Other | Source: Ambulatory Visit | Attending: Internal Medicine | Admitting: Internal Medicine

## 2014-09-09 ENCOUNTER — Other Ambulatory Visit (HOSPITAL_COMMUNITY): Payer: Self-pay | Admitting: Internal Medicine

## 2014-09-09 DIAGNOSIS — R05 Cough: Secondary | ICD-10-CM | POA: Diagnosis not present

## 2014-09-09 DIAGNOSIS — J984 Other disorders of lung: Secondary | ICD-10-CM | POA: Diagnosis not present

## 2014-09-09 DIAGNOSIS — J209 Acute bronchitis, unspecified: Secondary | ICD-10-CM | POA: Diagnosis not present

## 2014-09-09 DIAGNOSIS — R197 Diarrhea, unspecified: Secondary | ICD-10-CM | POA: Diagnosis not present

## 2014-09-09 DIAGNOSIS — Z23 Encounter for immunization: Secondary | ICD-10-CM | POA: Diagnosis not present

## 2014-09-10 DIAGNOSIS — R197 Diarrhea, unspecified: Secondary | ICD-10-CM | POA: Diagnosis not present

## 2014-10-09 DIAGNOSIS — R05 Cough: Secondary | ICD-10-CM | POA: Diagnosis not present

## 2014-12-16 DIAGNOSIS — Z1389 Encounter for screening for other disorder: Secondary | ICD-10-CM | POA: Diagnosis not present

## 2014-12-16 DIAGNOSIS — Z23 Encounter for immunization: Secondary | ICD-10-CM | POA: Diagnosis not present

## 2014-12-16 DIAGNOSIS — Z0000 Encounter for general adult medical examination without abnormal findings: Secondary | ICD-10-CM | POA: Diagnosis not present

## 2014-12-16 DIAGNOSIS — R609 Edema, unspecified: Secondary | ICD-10-CM | POA: Diagnosis not present

## 2014-12-16 DIAGNOSIS — D649 Anemia, unspecified: Secondary | ICD-10-CM | POA: Diagnosis not present

## 2015-01-28 DIAGNOSIS — L309 Dermatitis, unspecified: Secondary | ICD-10-CM | POA: Diagnosis not present

## 2015-01-28 DIAGNOSIS — K529 Noninfective gastroenteritis and colitis, unspecified: Secondary | ICD-10-CM | POA: Diagnosis not present

## 2015-01-28 DIAGNOSIS — K921 Melena: Secondary | ICD-10-CM | POA: Diagnosis not present

## 2015-02-26 DIAGNOSIS — L989 Disorder of the skin and subcutaneous tissue, unspecified: Secondary | ICD-10-CM | POA: Diagnosis not present

## 2015-03-02 DIAGNOSIS — C44729 Squamous cell carcinoma of skin of left lower limb, including hip: Secondary | ICD-10-CM | POA: Diagnosis not present

## 2015-03-02 DIAGNOSIS — L309 Dermatitis, unspecified: Secondary | ICD-10-CM | POA: Diagnosis not present

## 2015-03-09 DIAGNOSIS — L83 Acanthosis nigricans: Secondary | ICD-10-CM | POA: Diagnosis not present

## 2015-03-09 DIAGNOSIS — L430 Hypertrophic lichen planus: Secondary | ICD-10-CM | POA: Diagnosis not present

## 2015-03-09 DIAGNOSIS — C44729 Squamous cell carcinoma of skin of left lower limb, including hip: Secondary | ICD-10-CM | POA: Diagnosis not present

## 2015-03-09 DIAGNOSIS — L570 Actinic keratosis: Secondary | ICD-10-CM | POA: Diagnosis not present

## 2015-03-23 DIAGNOSIS — D649 Anemia, unspecified: Secondary | ICD-10-CM | POA: Diagnosis not present

## 2015-03-23 DIAGNOSIS — K529 Noninfective gastroenteritis and colitis, unspecified: Secondary | ICD-10-CM | POA: Diagnosis not present

## 2015-03-31 DIAGNOSIS — K921 Melena: Secondary | ICD-10-CM | POA: Diagnosis not present

## 2015-04-01 DIAGNOSIS — D500 Iron deficiency anemia secondary to blood loss (chronic): Secondary | ICD-10-CM | POA: Diagnosis not present

## 2015-04-01 DIAGNOSIS — A09 Infectious gastroenteritis and colitis, unspecified: Secondary | ICD-10-CM | POA: Diagnosis not present

## 2015-04-22 DIAGNOSIS — Z85828 Personal history of other malignant neoplasm of skin: Secondary | ICD-10-CM | POA: Diagnosis not present

## 2015-04-22 DIAGNOSIS — Z08 Encounter for follow-up examination after completed treatment for malignant neoplasm: Secondary | ICD-10-CM | POA: Diagnosis not present

## 2015-04-22 DIAGNOSIS — L905 Scar conditions and fibrosis of skin: Secondary | ICD-10-CM | POA: Diagnosis not present

## 2015-04-28 DIAGNOSIS — N390 Urinary tract infection, site not specified: Secondary | ICD-10-CM | POA: Diagnosis not present

## 2015-05-15 DIAGNOSIS — K5730 Diverticulosis of large intestine without perforation or abscess without bleeding: Secondary | ICD-10-CM | POA: Diagnosis not present

## 2015-05-15 DIAGNOSIS — D509 Iron deficiency anemia, unspecified: Secondary | ICD-10-CM | POA: Diagnosis not present

## 2015-05-15 DIAGNOSIS — K5289 Other specified noninfective gastroenteritis and colitis: Secondary | ICD-10-CM | POA: Diagnosis not present

## 2015-05-15 DIAGNOSIS — R197 Diarrhea, unspecified: Secondary | ICD-10-CM | POA: Diagnosis not present

## 2015-05-29 DIAGNOSIS — Z23 Encounter for immunization: Secondary | ICD-10-CM | POA: Diagnosis not present

## 2015-07-27 DIAGNOSIS — L439 Lichen planus, unspecified: Secondary | ICD-10-CM | POA: Diagnosis not present

## 2015-07-27 DIAGNOSIS — Z85828 Personal history of other malignant neoplasm of skin: Secondary | ICD-10-CM | POA: Diagnosis not present

## 2015-07-29 DIAGNOSIS — Z96642 Presence of left artificial hip joint: Secondary | ICD-10-CM | POA: Diagnosis not present

## 2015-07-29 DIAGNOSIS — Z471 Aftercare following joint replacement surgery: Secondary | ICD-10-CM | POA: Diagnosis not present

## 2015-07-29 DIAGNOSIS — M25551 Pain in right hip: Secondary | ICD-10-CM | POA: Diagnosis not present

## 2015-08-27 DIAGNOSIS — C44729 Squamous cell carcinoma of skin of left lower limb, including hip: Secondary | ICD-10-CM | POA: Diagnosis not present

## 2015-08-27 DIAGNOSIS — L570 Actinic keratosis: Secondary | ICD-10-CM | POA: Diagnosis not present

## 2015-08-31 DIAGNOSIS — R260 Ataxic gait: Secondary | ICD-10-CM | POA: Diagnosis not present

## 2015-08-31 DIAGNOSIS — M6281 Muscle weakness (generalized): Secondary | ICD-10-CM | POA: Diagnosis not present

## 2015-08-31 DIAGNOSIS — R278 Other lack of coordination: Secondary | ICD-10-CM | POA: Diagnosis not present

## 2015-08-31 DIAGNOSIS — Z9181 History of falling: Secondary | ICD-10-CM | POA: Diagnosis not present

## 2015-09-02 DIAGNOSIS — Z9181 History of falling: Secondary | ICD-10-CM | POA: Diagnosis not present

## 2015-09-02 DIAGNOSIS — M6281 Muscle weakness (generalized): Secondary | ICD-10-CM | POA: Diagnosis not present

## 2015-09-02 DIAGNOSIS — R260 Ataxic gait: Secondary | ICD-10-CM | POA: Diagnosis not present

## 2015-09-02 DIAGNOSIS — R278 Other lack of coordination: Secondary | ICD-10-CM | POA: Diagnosis not present

## 2015-09-03 DIAGNOSIS — Z9181 History of falling: Secondary | ICD-10-CM | POA: Diagnosis not present

## 2015-09-03 DIAGNOSIS — M6281 Muscle weakness (generalized): Secondary | ICD-10-CM | POA: Diagnosis not present

## 2015-09-03 DIAGNOSIS — R260 Ataxic gait: Secondary | ICD-10-CM | POA: Diagnosis not present

## 2015-09-03 DIAGNOSIS — R278 Other lack of coordination: Secondary | ICD-10-CM | POA: Diagnosis not present

## 2015-09-08 DIAGNOSIS — M6281 Muscle weakness (generalized): Secondary | ICD-10-CM | POA: Diagnosis not present

## 2015-09-08 DIAGNOSIS — R278 Other lack of coordination: Secondary | ICD-10-CM | POA: Diagnosis not present

## 2015-09-08 DIAGNOSIS — Z9181 History of falling: Secondary | ICD-10-CM | POA: Diagnosis not present

## 2015-09-08 DIAGNOSIS — R260 Ataxic gait: Secondary | ICD-10-CM | POA: Diagnosis not present

## 2015-09-10 DIAGNOSIS — Z9181 History of falling: Secondary | ICD-10-CM | POA: Diagnosis not present

## 2015-09-10 DIAGNOSIS — R260 Ataxic gait: Secondary | ICD-10-CM | POA: Diagnosis not present

## 2015-09-10 DIAGNOSIS — M6281 Muscle weakness (generalized): Secondary | ICD-10-CM | POA: Diagnosis not present

## 2015-09-10 DIAGNOSIS — R278 Other lack of coordination: Secondary | ICD-10-CM | POA: Diagnosis not present

## 2015-09-21 DIAGNOSIS — Z9181 History of falling: Secondary | ICD-10-CM | POA: Diagnosis not present

## 2015-09-21 DIAGNOSIS — R260 Ataxic gait: Secondary | ICD-10-CM | POA: Diagnosis not present

## 2015-09-21 DIAGNOSIS — R278 Other lack of coordination: Secondary | ICD-10-CM | POA: Diagnosis not present

## 2015-09-21 DIAGNOSIS — M6281 Muscle weakness (generalized): Secondary | ICD-10-CM | POA: Diagnosis not present

## 2015-09-23 DIAGNOSIS — R260 Ataxic gait: Secondary | ICD-10-CM | POA: Diagnosis not present

## 2015-09-23 DIAGNOSIS — Z9181 History of falling: Secondary | ICD-10-CM | POA: Diagnosis not present

## 2015-09-23 DIAGNOSIS — R278 Other lack of coordination: Secondary | ICD-10-CM | POA: Diagnosis not present

## 2015-09-23 DIAGNOSIS — M6281 Muscle weakness (generalized): Secondary | ICD-10-CM | POA: Diagnosis not present

## 2015-09-28 DIAGNOSIS — R260 Ataxic gait: Secondary | ICD-10-CM | POA: Diagnosis not present

## 2015-09-28 DIAGNOSIS — R278 Other lack of coordination: Secondary | ICD-10-CM | POA: Diagnosis not present

## 2015-09-28 DIAGNOSIS — Z9181 History of falling: Secondary | ICD-10-CM | POA: Diagnosis not present

## 2015-09-28 DIAGNOSIS — M6281 Muscle weakness (generalized): Secondary | ICD-10-CM | POA: Diagnosis not present

## 2015-09-30 DIAGNOSIS — R260 Ataxic gait: Secondary | ICD-10-CM | POA: Diagnosis not present

## 2015-09-30 DIAGNOSIS — Z9181 History of falling: Secondary | ICD-10-CM | POA: Diagnosis not present

## 2015-09-30 DIAGNOSIS — M6281 Muscle weakness (generalized): Secondary | ICD-10-CM | POA: Diagnosis not present

## 2015-09-30 DIAGNOSIS — R278 Other lack of coordination: Secondary | ICD-10-CM | POA: Diagnosis not present

## 2015-10-01 DIAGNOSIS — R278 Other lack of coordination: Secondary | ICD-10-CM | POA: Diagnosis not present

## 2015-10-01 DIAGNOSIS — Z9181 History of falling: Secondary | ICD-10-CM | POA: Diagnosis not present

## 2015-10-01 DIAGNOSIS — M6281 Muscle weakness (generalized): Secondary | ICD-10-CM | POA: Diagnosis not present

## 2015-10-01 DIAGNOSIS — R260 Ataxic gait: Secondary | ICD-10-CM | POA: Diagnosis not present

## 2015-10-05 DIAGNOSIS — M6281 Muscle weakness (generalized): Secondary | ICD-10-CM | POA: Diagnosis not present

## 2015-10-05 DIAGNOSIS — Z9181 History of falling: Secondary | ICD-10-CM | POA: Diagnosis not present

## 2015-10-05 DIAGNOSIS — R260 Ataxic gait: Secondary | ICD-10-CM | POA: Diagnosis not present

## 2015-10-05 DIAGNOSIS — R278 Other lack of coordination: Secondary | ICD-10-CM | POA: Diagnosis not present

## 2015-10-07 DIAGNOSIS — Z9181 History of falling: Secondary | ICD-10-CM | POA: Diagnosis not present

## 2015-10-07 DIAGNOSIS — R278 Other lack of coordination: Secondary | ICD-10-CM | POA: Diagnosis not present

## 2015-10-07 DIAGNOSIS — M6281 Muscle weakness (generalized): Secondary | ICD-10-CM | POA: Diagnosis not present

## 2015-10-07 DIAGNOSIS — R260 Ataxic gait: Secondary | ICD-10-CM | POA: Diagnosis not present

## 2015-10-08 DIAGNOSIS — M6281 Muscle weakness (generalized): Secondary | ICD-10-CM | POA: Diagnosis not present

## 2015-10-08 DIAGNOSIS — R278 Other lack of coordination: Secondary | ICD-10-CM | POA: Diagnosis not present

## 2015-10-08 DIAGNOSIS — Z9181 History of falling: Secondary | ICD-10-CM | POA: Diagnosis not present

## 2015-10-08 DIAGNOSIS — R260 Ataxic gait: Secondary | ICD-10-CM | POA: Diagnosis not present

## 2015-10-12 DIAGNOSIS — R260 Ataxic gait: Secondary | ICD-10-CM | POA: Diagnosis not present

## 2015-10-12 DIAGNOSIS — Z9181 History of falling: Secondary | ICD-10-CM | POA: Diagnosis not present

## 2015-10-12 DIAGNOSIS — R278 Other lack of coordination: Secondary | ICD-10-CM | POA: Diagnosis not present

## 2015-10-12 DIAGNOSIS — M6281 Muscle weakness (generalized): Secondary | ICD-10-CM | POA: Diagnosis not present

## 2015-10-14 DIAGNOSIS — R260 Ataxic gait: Secondary | ICD-10-CM | POA: Diagnosis not present

## 2015-10-14 DIAGNOSIS — M6281 Muscle weakness (generalized): Secondary | ICD-10-CM | POA: Diagnosis not present

## 2015-10-14 DIAGNOSIS — R278 Other lack of coordination: Secondary | ICD-10-CM | POA: Diagnosis not present

## 2015-10-14 DIAGNOSIS — Z9181 History of falling: Secondary | ICD-10-CM | POA: Diagnosis not present

## 2015-10-15 DIAGNOSIS — M6281 Muscle weakness (generalized): Secondary | ICD-10-CM | POA: Diagnosis not present

## 2015-10-15 DIAGNOSIS — R278 Other lack of coordination: Secondary | ICD-10-CM | POA: Diagnosis not present

## 2015-10-15 DIAGNOSIS — R260 Ataxic gait: Secondary | ICD-10-CM | POA: Diagnosis not present

## 2015-10-15 DIAGNOSIS — Z9181 History of falling: Secondary | ICD-10-CM | POA: Diagnosis not present

## 2015-10-20 DIAGNOSIS — Z9181 History of falling: Secondary | ICD-10-CM | POA: Diagnosis not present

## 2015-10-20 DIAGNOSIS — M6281 Muscle weakness (generalized): Secondary | ICD-10-CM | POA: Diagnosis not present

## 2015-10-20 DIAGNOSIS — R260 Ataxic gait: Secondary | ICD-10-CM | POA: Diagnosis not present

## 2015-10-20 DIAGNOSIS — R278 Other lack of coordination: Secondary | ICD-10-CM | POA: Diagnosis not present

## 2015-10-21 DIAGNOSIS — R260 Ataxic gait: Secondary | ICD-10-CM | POA: Diagnosis not present

## 2015-10-21 DIAGNOSIS — M6281 Muscle weakness (generalized): Secondary | ICD-10-CM | POA: Diagnosis not present

## 2015-10-21 DIAGNOSIS — Z9181 History of falling: Secondary | ICD-10-CM | POA: Diagnosis not present

## 2015-10-21 DIAGNOSIS — R278 Other lack of coordination: Secondary | ICD-10-CM | POA: Diagnosis not present

## 2015-10-23 DIAGNOSIS — M25775 Osteophyte, left foot: Secondary | ICD-10-CM | POA: Diagnosis not present

## 2015-10-26 DIAGNOSIS — R260 Ataxic gait: Secondary | ICD-10-CM | POA: Diagnosis not present

## 2015-10-26 DIAGNOSIS — Z9181 History of falling: Secondary | ICD-10-CM | POA: Diagnosis not present

## 2015-10-26 DIAGNOSIS — M6281 Muscle weakness (generalized): Secondary | ICD-10-CM | POA: Diagnosis not present

## 2015-10-26 DIAGNOSIS — R278 Other lack of coordination: Secondary | ICD-10-CM | POA: Diagnosis not present

## 2015-10-28 ENCOUNTER — Ambulatory Visit: Payer: Self-pay | Admitting: Podiatry

## 2015-11-27 DIAGNOSIS — A09 Infectious gastroenteritis and colitis, unspecified: Secondary | ICD-10-CM | POA: Diagnosis not present

## 2015-12-17 DIAGNOSIS — D485 Neoplasm of uncertain behavior of skin: Secondary | ICD-10-CM | POA: Diagnosis not present

## 2015-12-17 DIAGNOSIS — Z85828 Personal history of other malignant neoplasm of skin: Secondary | ICD-10-CM | POA: Diagnosis not present

## 2015-12-17 DIAGNOSIS — C44729 Squamous cell carcinoma of skin of left lower limb, including hip: Secondary | ICD-10-CM | POA: Diagnosis not present

## 2015-12-21 ENCOUNTER — Ambulatory Visit: Payer: 59 | Admitting: Internal Medicine

## 2015-12-21 DIAGNOSIS — Z0289 Encounter for other administrative examinations: Secondary | ICD-10-CM

## 2015-12-28 DIAGNOSIS — A09 Infectious gastroenteritis and colitis, unspecified: Secondary | ICD-10-CM | POA: Diagnosis not present

## 2016-01-05 ENCOUNTER — Ambulatory Visit: Payer: Self-pay | Admitting: Internal Medicine

## 2016-03-03 DIAGNOSIS — A09 Infectious gastroenteritis and colitis, unspecified: Secondary | ICD-10-CM | POA: Diagnosis not present

## 2016-03-21 DIAGNOSIS — Z0000 Encounter for general adult medical examination without abnormal findings: Secondary | ICD-10-CM | POA: Diagnosis not present

## 2016-03-21 DIAGNOSIS — K529 Noninfective gastroenteritis and colitis, unspecified: Secondary | ICD-10-CM | POA: Diagnosis not present

## 2016-03-21 DIAGNOSIS — K52839 Microscopic colitis, unspecified: Secondary | ICD-10-CM | POA: Diagnosis not present

## 2016-03-21 DIAGNOSIS — R270 Ataxia, unspecified: Secondary | ICD-10-CM | POA: Diagnosis not present

## 2016-03-23 DIAGNOSIS — R260 Ataxic gait: Secondary | ICD-10-CM | POA: Diagnosis not present

## 2016-03-23 DIAGNOSIS — Z9181 History of falling: Secondary | ICD-10-CM | POA: Diagnosis not present

## 2016-03-23 DIAGNOSIS — R2681 Unsteadiness on feet: Secondary | ICD-10-CM | POA: Diagnosis not present

## 2016-03-23 DIAGNOSIS — M6281 Muscle weakness (generalized): Secondary | ICD-10-CM | POA: Diagnosis not present

## 2016-03-25 DIAGNOSIS — R260 Ataxic gait: Secondary | ICD-10-CM | POA: Diagnosis not present

## 2016-03-25 DIAGNOSIS — R2681 Unsteadiness on feet: Secondary | ICD-10-CM | POA: Diagnosis not present

## 2016-03-25 DIAGNOSIS — M6281 Muscle weakness (generalized): Secondary | ICD-10-CM | POA: Diagnosis not present

## 2016-03-25 DIAGNOSIS — Z9181 History of falling: Secondary | ICD-10-CM | POA: Diagnosis not present

## 2016-03-28 DIAGNOSIS — Z9181 History of falling: Secondary | ICD-10-CM | POA: Diagnosis not present

## 2016-03-28 DIAGNOSIS — R260 Ataxic gait: Secondary | ICD-10-CM | POA: Diagnosis not present

## 2016-03-28 DIAGNOSIS — M6281 Muscle weakness (generalized): Secondary | ICD-10-CM | POA: Diagnosis not present

## 2016-03-28 DIAGNOSIS — R2681 Unsteadiness on feet: Secondary | ICD-10-CM | POA: Diagnosis not present

## 2016-03-30 DIAGNOSIS — L439 Lichen planus, unspecified: Secondary | ICD-10-CM | POA: Diagnosis not present

## 2016-03-30 DIAGNOSIS — D485 Neoplasm of uncertain behavior of skin: Secondary | ICD-10-CM | POA: Diagnosis not present

## 2016-03-30 DIAGNOSIS — L570 Actinic keratosis: Secondary | ICD-10-CM | POA: Diagnosis not present

## 2016-03-30 DIAGNOSIS — Z85828 Personal history of other malignant neoplasm of skin: Secondary | ICD-10-CM | POA: Diagnosis not present

## 2016-03-31 DIAGNOSIS — R260 Ataxic gait: Secondary | ICD-10-CM | POA: Diagnosis not present

## 2016-03-31 DIAGNOSIS — R2681 Unsteadiness on feet: Secondary | ICD-10-CM | POA: Diagnosis not present

## 2016-03-31 DIAGNOSIS — Z9181 History of falling: Secondary | ICD-10-CM | POA: Diagnosis not present

## 2016-03-31 DIAGNOSIS — M6281 Muscle weakness (generalized): Secondary | ICD-10-CM | POA: Diagnosis not present

## 2016-04-04 DIAGNOSIS — R2681 Unsteadiness on feet: Secondary | ICD-10-CM | POA: Diagnosis not present

## 2016-04-04 DIAGNOSIS — M6281 Muscle weakness (generalized): Secondary | ICD-10-CM | POA: Diagnosis not present

## 2016-04-04 DIAGNOSIS — Z9181 History of falling: Secondary | ICD-10-CM | POA: Diagnosis not present

## 2016-04-04 DIAGNOSIS — R260 Ataxic gait: Secondary | ICD-10-CM | POA: Diagnosis not present

## 2016-04-05 DIAGNOSIS — R2681 Unsteadiness on feet: Secondary | ICD-10-CM | POA: Diagnosis not present

## 2016-04-05 DIAGNOSIS — R260 Ataxic gait: Secondary | ICD-10-CM | POA: Diagnosis not present

## 2016-04-05 DIAGNOSIS — M6281 Muscle weakness (generalized): Secondary | ICD-10-CM | POA: Diagnosis not present

## 2016-04-05 DIAGNOSIS — Z9181 History of falling: Secondary | ICD-10-CM | POA: Diagnosis not present

## 2016-04-07 DIAGNOSIS — R260 Ataxic gait: Secondary | ICD-10-CM | POA: Diagnosis not present

## 2016-04-07 DIAGNOSIS — Z9181 History of falling: Secondary | ICD-10-CM | POA: Diagnosis not present

## 2016-04-07 DIAGNOSIS — R2681 Unsteadiness on feet: Secondary | ICD-10-CM | POA: Diagnosis not present

## 2016-04-07 DIAGNOSIS — M6281 Muscle weakness (generalized): Secondary | ICD-10-CM | POA: Diagnosis not present

## 2016-04-11 DIAGNOSIS — R2681 Unsteadiness on feet: Secondary | ICD-10-CM | POA: Diagnosis not present

## 2016-04-11 DIAGNOSIS — M6281 Muscle weakness (generalized): Secondary | ICD-10-CM | POA: Diagnosis not present

## 2016-04-11 DIAGNOSIS — R260 Ataxic gait: Secondary | ICD-10-CM | POA: Diagnosis not present

## 2016-04-11 DIAGNOSIS — Z9181 History of falling: Secondary | ICD-10-CM | POA: Diagnosis not present

## 2016-04-13 DIAGNOSIS — M6281 Muscle weakness (generalized): Secondary | ICD-10-CM | POA: Diagnosis not present

## 2016-04-13 DIAGNOSIS — Z9181 History of falling: Secondary | ICD-10-CM | POA: Diagnosis not present

## 2016-04-13 DIAGNOSIS — R260 Ataxic gait: Secondary | ICD-10-CM | POA: Diagnosis not present

## 2016-04-13 DIAGNOSIS — R2681 Unsteadiness on feet: Secondary | ICD-10-CM | POA: Diagnosis not present

## 2016-04-14 DIAGNOSIS — Z9181 History of falling: Secondary | ICD-10-CM | POA: Diagnosis not present

## 2016-04-14 DIAGNOSIS — R2681 Unsteadiness on feet: Secondary | ICD-10-CM | POA: Diagnosis not present

## 2016-04-14 DIAGNOSIS — R260 Ataxic gait: Secondary | ICD-10-CM | POA: Diagnosis not present

## 2016-04-14 DIAGNOSIS — M6281 Muscle weakness (generalized): Secondary | ICD-10-CM | POA: Diagnosis not present

## 2016-04-19 DIAGNOSIS — M6281 Muscle weakness (generalized): Secondary | ICD-10-CM | POA: Diagnosis not present

## 2016-04-19 DIAGNOSIS — Z9181 History of falling: Secondary | ICD-10-CM | POA: Diagnosis not present

## 2016-04-19 DIAGNOSIS — R260 Ataxic gait: Secondary | ICD-10-CM | POA: Diagnosis not present

## 2016-04-19 DIAGNOSIS — R2681 Unsteadiness on feet: Secondary | ICD-10-CM | POA: Diagnosis not present

## 2016-04-20 DIAGNOSIS — Z9181 History of falling: Secondary | ICD-10-CM | POA: Diagnosis not present

## 2016-04-20 DIAGNOSIS — R2681 Unsteadiness on feet: Secondary | ICD-10-CM | POA: Diagnosis not present

## 2016-04-20 DIAGNOSIS — R260 Ataxic gait: Secondary | ICD-10-CM | POA: Diagnosis not present

## 2016-04-20 DIAGNOSIS — M6281 Muscle weakness (generalized): Secondary | ICD-10-CM | POA: Diagnosis not present

## 2016-04-21 DIAGNOSIS — R260 Ataxic gait: Secondary | ICD-10-CM | POA: Diagnosis not present

## 2016-04-21 DIAGNOSIS — M6281 Muscle weakness (generalized): Secondary | ICD-10-CM | POA: Diagnosis not present

## 2016-04-21 DIAGNOSIS — R2681 Unsteadiness on feet: Secondary | ICD-10-CM | POA: Diagnosis not present

## 2016-04-21 DIAGNOSIS — Z9181 History of falling: Secondary | ICD-10-CM | POA: Diagnosis not present

## 2016-04-26 DIAGNOSIS — Z9181 History of falling: Secondary | ICD-10-CM | POA: Diagnosis not present

## 2016-04-26 DIAGNOSIS — M6281 Muscle weakness (generalized): Secondary | ICD-10-CM | POA: Diagnosis not present

## 2016-04-26 DIAGNOSIS — R2681 Unsteadiness on feet: Secondary | ICD-10-CM | POA: Diagnosis not present

## 2016-04-26 DIAGNOSIS — R260 Ataxic gait: Secondary | ICD-10-CM | POA: Diagnosis not present

## 2016-05-02 DIAGNOSIS — M6281 Muscle weakness (generalized): Secondary | ICD-10-CM | POA: Diagnosis not present

## 2016-05-02 DIAGNOSIS — R2681 Unsteadiness on feet: Secondary | ICD-10-CM | POA: Diagnosis not present

## 2016-05-02 DIAGNOSIS — R260 Ataxic gait: Secondary | ICD-10-CM | POA: Diagnosis not present

## 2016-05-02 DIAGNOSIS — Z9181 History of falling: Secondary | ICD-10-CM | POA: Diagnosis not present

## 2016-05-03 DIAGNOSIS — M6281 Muscle weakness (generalized): Secondary | ICD-10-CM | POA: Diagnosis not present

## 2016-05-03 DIAGNOSIS — R2681 Unsteadiness on feet: Secondary | ICD-10-CM | POA: Diagnosis not present

## 2016-05-03 DIAGNOSIS — Z9181 History of falling: Secondary | ICD-10-CM | POA: Diagnosis not present

## 2016-05-03 DIAGNOSIS — R260 Ataxic gait: Secondary | ICD-10-CM | POA: Diagnosis not present

## 2016-05-05 DIAGNOSIS — R260 Ataxic gait: Secondary | ICD-10-CM | POA: Diagnosis not present

## 2016-05-05 DIAGNOSIS — M6281 Muscle weakness (generalized): Secondary | ICD-10-CM | POA: Diagnosis not present

## 2016-05-05 DIAGNOSIS — Z9181 History of falling: Secondary | ICD-10-CM | POA: Diagnosis not present

## 2016-05-05 DIAGNOSIS — R2681 Unsteadiness on feet: Secondary | ICD-10-CM | POA: Diagnosis not present

## 2016-05-10 DIAGNOSIS — R2681 Unsteadiness on feet: Secondary | ICD-10-CM | POA: Diagnosis not present

## 2016-05-10 DIAGNOSIS — M6281 Muscle weakness (generalized): Secondary | ICD-10-CM | POA: Diagnosis not present

## 2016-05-10 DIAGNOSIS — R260 Ataxic gait: Secondary | ICD-10-CM | POA: Diagnosis not present

## 2016-05-10 DIAGNOSIS — Z9181 History of falling: Secondary | ICD-10-CM | POA: Diagnosis not present

## 2016-05-11 DIAGNOSIS — Z9181 History of falling: Secondary | ICD-10-CM | POA: Diagnosis not present

## 2016-05-11 DIAGNOSIS — M6281 Muscle weakness (generalized): Secondary | ICD-10-CM | POA: Diagnosis not present

## 2016-05-11 DIAGNOSIS — R260 Ataxic gait: Secondary | ICD-10-CM | POA: Diagnosis not present

## 2016-05-11 DIAGNOSIS — R2681 Unsteadiness on feet: Secondary | ICD-10-CM | POA: Diagnosis not present

## 2016-05-12 DIAGNOSIS — R260 Ataxic gait: Secondary | ICD-10-CM | POA: Diagnosis not present

## 2016-05-12 DIAGNOSIS — Z9181 History of falling: Secondary | ICD-10-CM | POA: Diagnosis not present

## 2016-05-12 DIAGNOSIS — M6281 Muscle weakness (generalized): Secondary | ICD-10-CM | POA: Diagnosis not present

## 2016-05-12 DIAGNOSIS — R2681 Unsteadiness on feet: Secondary | ICD-10-CM | POA: Diagnosis not present

## 2016-05-16 DIAGNOSIS — R260 Ataxic gait: Secondary | ICD-10-CM | POA: Diagnosis not present

## 2016-05-16 DIAGNOSIS — Z9181 History of falling: Secondary | ICD-10-CM | POA: Diagnosis not present

## 2016-05-16 DIAGNOSIS — R2681 Unsteadiness on feet: Secondary | ICD-10-CM | POA: Diagnosis not present

## 2016-05-16 DIAGNOSIS — M6281 Muscle weakness (generalized): Secondary | ICD-10-CM | POA: Diagnosis not present

## 2016-05-19 DIAGNOSIS — R2681 Unsteadiness on feet: Secondary | ICD-10-CM | POA: Diagnosis not present

## 2016-05-19 DIAGNOSIS — R260 Ataxic gait: Secondary | ICD-10-CM | POA: Diagnosis not present

## 2016-05-19 DIAGNOSIS — M6281 Muscle weakness (generalized): Secondary | ICD-10-CM | POA: Diagnosis not present

## 2016-05-19 DIAGNOSIS — Z9181 History of falling: Secondary | ICD-10-CM | POA: Diagnosis not present

## 2016-05-23 DIAGNOSIS — R260 Ataxic gait: Secondary | ICD-10-CM | POA: Diagnosis not present

## 2016-05-23 DIAGNOSIS — Z9181 History of falling: Secondary | ICD-10-CM | POA: Diagnosis not present

## 2016-05-23 DIAGNOSIS — M6281 Muscle weakness (generalized): Secondary | ICD-10-CM | POA: Diagnosis not present

## 2016-05-23 DIAGNOSIS — R2681 Unsteadiness on feet: Secondary | ICD-10-CM | POA: Diagnosis not present

## 2016-05-30 DIAGNOSIS — Z9181 History of falling: Secondary | ICD-10-CM | POA: Diagnosis not present

## 2016-05-30 DIAGNOSIS — R260 Ataxic gait: Secondary | ICD-10-CM | POA: Diagnosis not present

## 2016-05-30 DIAGNOSIS — M6281 Muscle weakness (generalized): Secondary | ICD-10-CM | POA: Diagnosis not present

## 2016-05-30 DIAGNOSIS — R2681 Unsteadiness on feet: Secondary | ICD-10-CM | POA: Diagnosis not present

## 2016-06-01 DIAGNOSIS — R2681 Unsteadiness on feet: Secondary | ICD-10-CM | POA: Diagnosis not present

## 2016-06-01 DIAGNOSIS — M6281 Muscle weakness (generalized): Secondary | ICD-10-CM | POA: Diagnosis not present

## 2016-06-01 DIAGNOSIS — Z9181 History of falling: Secondary | ICD-10-CM | POA: Diagnosis not present

## 2016-06-01 DIAGNOSIS — R260 Ataxic gait: Secondary | ICD-10-CM | POA: Diagnosis not present

## 2016-06-06 DIAGNOSIS — Z9181 History of falling: Secondary | ICD-10-CM | POA: Diagnosis not present

## 2016-06-06 DIAGNOSIS — M6281 Muscle weakness (generalized): Secondary | ICD-10-CM | POA: Diagnosis not present

## 2016-06-06 DIAGNOSIS — R2681 Unsteadiness on feet: Secondary | ICD-10-CM | POA: Diagnosis not present

## 2016-06-06 DIAGNOSIS — R260 Ataxic gait: Secondary | ICD-10-CM | POA: Diagnosis not present

## 2016-06-08 DIAGNOSIS — Z9181 History of falling: Secondary | ICD-10-CM | POA: Diagnosis not present

## 2016-06-08 DIAGNOSIS — M6281 Muscle weakness (generalized): Secondary | ICD-10-CM | POA: Diagnosis not present

## 2016-06-08 DIAGNOSIS — R2681 Unsteadiness on feet: Secondary | ICD-10-CM | POA: Diagnosis not present

## 2016-06-08 DIAGNOSIS — R260 Ataxic gait: Secondary | ICD-10-CM | POA: Diagnosis not present

## 2016-06-09 DIAGNOSIS — A09 Infectious gastroenteritis and colitis, unspecified: Secondary | ICD-10-CM | POA: Diagnosis not present

## 2016-07-27 ENCOUNTER — Other Ambulatory Visit: Payer: Self-pay | Admitting: Internal Medicine

## 2016-07-27 ENCOUNTER — Ambulatory Visit
Admission: RE | Admit: 2016-07-27 | Discharge: 2016-07-27 | Disposition: A | Discharge status: Home / Self Care | Payer: Medicare Other | Source: Ambulatory Visit | Attending: Internal Medicine | Admitting: Internal Medicine

## 2016-07-27 DIAGNOSIS — R05 Cough: Secondary | ICD-10-CM | POA: Diagnosis not present

## 2016-07-27 DIAGNOSIS — J209 Acute bronchitis, unspecified: Secondary | ICD-10-CM

## 2016-07-27 DIAGNOSIS — R0602 Shortness of breath: Secondary | ICD-10-CM | POA: Diagnosis not present

## 2016-08-08 DIAGNOSIS — A09 Infectious gastroenteritis and colitis, unspecified: Secondary | ICD-10-CM | POA: Diagnosis not present

## 2016-10-04 ENCOUNTER — Other Ambulatory Visit: Payer: Self-pay | Admitting: Internal Medicine

## 2016-10-04 ENCOUNTER — Ambulatory Visit
Admission: RE | Admit: 2016-10-04 | Discharge: 2016-10-04 | Disposition: A | Discharge status: Home / Self Care | Payer: Medicare Other | Source: Ambulatory Visit | Attending: Internal Medicine | Admitting: Internal Medicine

## 2016-10-04 DIAGNOSIS — J40 Bronchitis, not specified as acute or chronic: Secondary | ICD-10-CM | POA: Diagnosis not present

## 2016-10-04 DIAGNOSIS — J209 Acute bronchitis, unspecified: Secondary | ICD-10-CM

## 2016-10-04 DIAGNOSIS — R05 Cough: Secondary | ICD-10-CM | POA: Diagnosis not present

## 2016-10-04 DIAGNOSIS — R0602 Shortness of breath: Secondary | ICD-10-CM | POA: Diagnosis not present

## 2016-10-04 DIAGNOSIS — J309 Allergic rhinitis, unspecified: Secondary | ICD-10-CM | POA: Diagnosis not present

## 2016-10-10 DIAGNOSIS — H524 Presbyopia: Secondary | ICD-10-CM | POA: Diagnosis not present

## 2016-10-10 DIAGNOSIS — H5213 Myopia, bilateral: Secondary | ICD-10-CM | POA: Diagnosis not present

## 2016-10-10 DIAGNOSIS — Z947 Corneal transplant status: Secondary | ICD-10-CM | POA: Diagnosis not present

## 2016-10-10 DIAGNOSIS — H52201 Unspecified astigmatism, right eye: Secondary | ICD-10-CM | POA: Diagnosis not present

## 2016-10-10 DIAGNOSIS — Z961 Presence of intraocular lens: Secondary | ICD-10-CM | POA: Diagnosis not present

## 2017-01-30 DIAGNOSIS — M25551 Pain in right hip: Secondary | ICD-10-CM | POA: Diagnosis not present

## 2017-01-30 DIAGNOSIS — M7061 Trochanteric bursitis, right hip: Secondary | ICD-10-CM | POA: Diagnosis not present

## 2017-02-01 DIAGNOSIS — A09 Infectious gastroenteritis and colitis, unspecified: Secondary | ICD-10-CM | POA: Diagnosis not present

## 2017-02-09 DIAGNOSIS — M6281 Muscle weakness (generalized): Secondary | ICD-10-CM | POA: Diagnosis not present

## 2017-02-09 DIAGNOSIS — M25551 Pain in right hip: Secondary | ICD-10-CM | POA: Diagnosis not present

## 2017-02-09 DIAGNOSIS — R278 Other lack of coordination: Secondary | ICD-10-CM | POA: Diagnosis not present

## 2017-02-09 DIAGNOSIS — M7061 Trochanteric bursitis, right hip: Secondary | ICD-10-CM | POA: Diagnosis not present

## 2017-02-13 DIAGNOSIS — R278 Other lack of coordination: Secondary | ICD-10-CM | POA: Diagnosis not present

## 2017-02-13 DIAGNOSIS — M25551 Pain in right hip: Secondary | ICD-10-CM | POA: Diagnosis not present

## 2017-02-13 DIAGNOSIS — M6281 Muscle weakness (generalized): Secondary | ICD-10-CM | POA: Diagnosis not present

## 2017-02-13 DIAGNOSIS — M7061 Trochanteric bursitis, right hip: Secondary | ICD-10-CM | POA: Diagnosis not present

## 2017-02-15 DIAGNOSIS — M7061 Trochanteric bursitis, right hip: Secondary | ICD-10-CM | POA: Diagnosis not present

## 2017-02-15 DIAGNOSIS — M6281 Muscle weakness (generalized): Secondary | ICD-10-CM | POA: Diagnosis not present

## 2017-02-15 DIAGNOSIS — M25551 Pain in right hip: Secondary | ICD-10-CM | POA: Diagnosis not present

## 2017-02-15 DIAGNOSIS — R278 Other lack of coordination: Secondary | ICD-10-CM | POA: Diagnosis not present

## 2017-02-17 DIAGNOSIS — M6281 Muscle weakness (generalized): Secondary | ICD-10-CM | POA: Diagnosis not present

## 2017-02-17 DIAGNOSIS — R278 Other lack of coordination: Secondary | ICD-10-CM | POA: Diagnosis not present

## 2017-02-17 DIAGNOSIS — M7061 Trochanteric bursitis, right hip: Secondary | ICD-10-CM | POA: Diagnosis not present

## 2017-02-17 DIAGNOSIS — M25551 Pain in right hip: Secondary | ICD-10-CM | POA: Diagnosis not present

## 2017-02-20 DIAGNOSIS — M6281 Muscle weakness (generalized): Secondary | ICD-10-CM | POA: Diagnosis not present

## 2017-02-20 DIAGNOSIS — M7061 Trochanteric bursitis, right hip: Secondary | ICD-10-CM | POA: Diagnosis not present

## 2017-02-20 DIAGNOSIS — R278 Other lack of coordination: Secondary | ICD-10-CM | POA: Diagnosis not present

## 2017-02-20 DIAGNOSIS — M25551 Pain in right hip: Secondary | ICD-10-CM | POA: Diagnosis not present

## 2017-02-21 DIAGNOSIS — R278 Other lack of coordination: Secondary | ICD-10-CM | POA: Diagnosis not present

## 2017-02-21 DIAGNOSIS — M6281 Muscle weakness (generalized): Secondary | ICD-10-CM | POA: Diagnosis not present

## 2017-02-21 DIAGNOSIS — M25551 Pain in right hip: Secondary | ICD-10-CM | POA: Diagnosis not present

## 2017-02-21 DIAGNOSIS — M7061 Trochanteric bursitis, right hip: Secondary | ICD-10-CM | POA: Diagnosis not present

## 2017-02-23 DIAGNOSIS — R278 Other lack of coordination: Secondary | ICD-10-CM | POA: Diagnosis not present

## 2017-02-23 DIAGNOSIS — M6281 Muscle weakness (generalized): Secondary | ICD-10-CM | POA: Diagnosis not present

## 2017-02-23 DIAGNOSIS — M7061 Trochanteric bursitis, right hip: Secondary | ICD-10-CM | POA: Diagnosis not present

## 2017-02-23 DIAGNOSIS — M25551 Pain in right hip: Secondary | ICD-10-CM | POA: Diagnosis not present

## 2017-02-27 DIAGNOSIS — R278 Other lack of coordination: Secondary | ICD-10-CM | POA: Diagnosis not present

## 2017-02-27 DIAGNOSIS — M7061 Trochanteric bursitis, right hip: Secondary | ICD-10-CM | POA: Diagnosis not present

## 2017-02-27 DIAGNOSIS — M25551 Pain in right hip: Secondary | ICD-10-CM | POA: Diagnosis not present

## 2017-02-27 DIAGNOSIS — M6281 Muscle weakness (generalized): Secondary | ICD-10-CM | POA: Diagnosis not present

## 2017-03-01 DIAGNOSIS — M25551 Pain in right hip: Secondary | ICD-10-CM | POA: Diagnosis not present

## 2017-03-01 DIAGNOSIS — M7061 Trochanteric bursitis, right hip: Secondary | ICD-10-CM | POA: Diagnosis not present

## 2017-03-01 DIAGNOSIS — R278 Other lack of coordination: Secondary | ICD-10-CM | POA: Diagnosis not present

## 2017-03-01 DIAGNOSIS — M6281 Muscle weakness (generalized): Secondary | ICD-10-CM | POA: Diagnosis not present

## 2017-03-02 DIAGNOSIS — M6281 Muscle weakness (generalized): Secondary | ICD-10-CM | POA: Diagnosis not present

## 2017-03-02 DIAGNOSIS — M25551 Pain in right hip: Secondary | ICD-10-CM | POA: Diagnosis not present

## 2017-03-02 DIAGNOSIS — R278 Other lack of coordination: Secondary | ICD-10-CM | POA: Diagnosis not present

## 2017-03-02 DIAGNOSIS — M7061 Trochanteric bursitis, right hip: Secondary | ICD-10-CM | POA: Diagnosis not present

## 2017-03-06 DIAGNOSIS — R278 Other lack of coordination: Secondary | ICD-10-CM | POA: Diagnosis not present

## 2017-03-06 DIAGNOSIS — M6281 Muscle weakness (generalized): Secondary | ICD-10-CM | POA: Diagnosis not present

## 2017-03-06 DIAGNOSIS — M25551 Pain in right hip: Secondary | ICD-10-CM | POA: Diagnosis not present

## 2017-03-06 DIAGNOSIS — M7061 Trochanteric bursitis, right hip: Secondary | ICD-10-CM | POA: Diagnosis not present

## 2017-03-08 DIAGNOSIS — M6281 Muscle weakness (generalized): Secondary | ICD-10-CM | POA: Diagnosis not present

## 2017-03-08 DIAGNOSIS — M25551 Pain in right hip: Secondary | ICD-10-CM | POA: Diagnosis not present

## 2017-03-08 DIAGNOSIS — M7061 Trochanteric bursitis, right hip: Secondary | ICD-10-CM | POA: Diagnosis not present

## 2017-03-08 DIAGNOSIS — R278 Other lack of coordination: Secondary | ICD-10-CM | POA: Diagnosis not present

## 2017-03-13 DIAGNOSIS — M7061 Trochanteric bursitis, right hip: Secondary | ICD-10-CM | POA: Diagnosis not present

## 2017-03-13 DIAGNOSIS — M6281 Muscle weakness (generalized): Secondary | ICD-10-CM | POA: Diagnosis not present

## 2017-03-13 DIAGNOSIS — M25551 Pain in right hip: Secondary | ICD-10-CM | POA: Diagnosis not present

## 2017-03-13 DIAGNOSIS — R278 Other lack of coordination: Secondary | ICD-10-CM | POA: Diagnosis not present

## 2017-03-15 DIAGNOSIS — M25551 Pain in right hip: Secondary | ICD-10-CM | POA: Diagnosis not present

## 2017-03-15 DIAGNOSIS — R278 Other lack of coordination: Secondary | ICD-10-CM | POA: Diagnosis not present

## 2017-03-15 DIAGNOSIS — M7061 Trochanteric bursitis, right hip: Secondary | ICD-10-CM | POA: Diagnosis not present

## 2017-03-15 DIAGNOSIS — M6281 Muscle weakness (generalized): Secondary | ICD-10-CM | POA: Diagnosis not present

## 2017-03-20 DIAGNOSIS — M7061 Trochanteric bursitis, right hip: Secondary | ICD-10-CM | POA: Diagnosis not present

## 2017-03-20 DIAGNOSIS — M6281 Muscle weakness (generalized): Secondary | ICD-10-CM | POA: Diagnosis not present

## 2017-03-20 DIAGNOSIS — R278 Other lack of coordination: Secondary | ICD-10-CM | POA: Diagnosis not present

## 2017-03-20 DIAGNOSIS — M25551 Pain in right hip: Secondary | ICD-10-CM | POA: Diagnosis not present

## 2017-03-22 DIAGNOSIS — Z1389 Encounter for screening for other disorder: Secondary | ICD-10-CM | POA: Diagnosis not present

## 2017-03-22 DIAGNOSIS — Z96641 Presence of right artificial hip joint: Secondary | ICD-10-CM | POA: Diagnosis not present

## 2017-03-22 DIAGNOSIS — D500 Iron deficiency anemia secondary to blood loss (chronic): Secondary | ICD-10-CM | POA: Diagnosis not present

## 2017-03-22 DIAGNOSIS — Z0000 Encounter for general adult medical examination without abnormal findings: Secondary | ICD-10-CM | POA: Diagnosis not present

## 2017-03-22 DIAGNOSIS — Z780 Asymptomatic menopausal state: Secondary | ICD-10-CM | POA: Diagnosis not present

## 2017-03-22 DIAGNOSIS — R270 Ataxia, unspecified: Secondary | ICD-10-CM | POA: Diagnosis not present

## 2017-03-22 DIAGNOSIS — A09 Infectious gastroenteritis and colitis, unspecified: Secondary | ICD-10-CM | POA: Diagnosis not present

## 2017-03-22 DIAGNOSIS — E559 Vitamin D deficiency, unspecified: Secondary | ICD-10-CM | POA: Diagnosis not present

## 2017-03-22 DIAGNOSIS — J309 Allergic rhinitis, unspecified: Secondary | ICD-10-CM | POA: Diagnosis not present

## 2017-04-05 DIAGNOSIS — R278 Other lack of coordination: Secondary | ICD-10-CM | POA: Diagnosis not present

## 2017-04-05 DIAGNOSIS — M6281 Muscle weakness (generalized): Secondary | ICD-10-CM | POA: Diagnosis not present

## 2017-04-05 DIAGNOSIS — M25551 Pain in right hip: Secondary | ICD-10-CM | POA: Diagnosis not present

## 2017-04-05 DIAGNOSIS — M7061 Trochanteric bursitis, right hip: Secondary | ICD-10-CM | POA: Diagnosis not present

## 2017-04-20 DIAGNOSIS — M545 Low back pain: Secondary | ICD-10-CM | POA: Diagnosis not present

## 2017-04-20 DIAGNOSIS — Z471 Aftercare following joint replacement surgery: Secondary | ICD-10-CM | POA: Diagnosis not present

## 2017-04-20 DIAGNOSIS — Z96643 Presence of artificial hip joint, bilateral: Secondary | ICD-10-CM | POA: Diagnosis not present

## 2017-05-04 DIAGNOSIS — M545 Low back pain: Secondary | ICD-10-CM | POA: Diagnosis not present

## 2017-05-05 DIAGNOSIS — M545 Low back pain: Secondary | ICD-10-CM | POA: Diagnosis not present

## 2017-05-15 DIAGNOSIS — Z780 Asymptomatic menopausal state: Secondary | ICD-10-CM | POA: Diagnosis not present

## 2017-05-15 DIAGNOSIS — M810 Age-related osteoporosis without current pathological fracture: Secondary | ICD-10-CM | POA: Diagnosis not present

## 2017-06-15 ENCOUNTER — Telehealth: Payer: Self-pay

## 2017-06-15 NOTE — Telephone Encounter (Signed)
SENT NOTES TO SCHEDULING 

## 2017-06-16 ENCOUNTER — Encounter (HOSPITAL_COMMUNITY): Payer: Self-pay

## 2017-06-16 ENCOUNTER — Emergency Department (HOSPITAL_COMMUNITY): Payer: Medicare Other

## 2017-06-16 ENCOUNTER — Emergency Department (HOSPITAL_COMMUNITY)
Admission: EM | Admit: 2017-06-16 | Discharge: 2017-06-17 | Disposition: A | Discharge status: Home / Self Care | Payer: Medicare Other | Attending: Emergency Medicine | Admitting: Emergency Medicine

## 2017-06-16 DIAGNOSIS — Y999 Unspecified external cause status: Secondary | ICD-10-CM | POA: Diagnosis not present

## 2017-06-16 DIAGNOSIS — Z87891 Personal history of nicotine dependence: Secondary | ICD-10-CM | POA: Insufficient documentation

## 2017-06-16 DIAGNOSIS — Y929 Unspecified place or not applicable: Secondary | ICD-10-CM | POA: Diagnosis not present

## 2017-06-16 DIAGNOSIS — W01198A Fall on same level from slipping, tripping and stumbling with subsequent striking against other object, initial encounter: Secondary | ICD-10-CM | POA: Diagnosis not present

## 2017-06-16 DIAGNOSIS — M545 Low back pain: Secondary | ICD-10-CM | POA: Diagnosis not present

## 2017-06-16 DIAGNOSIS — M25551 Pain in right hip: Secondary | ICD-10-CM | POA: Diagnosis not present

## 2017-06-16 DIAGNOSIS — Z79899 Other long term (current) drug therapy: Secondary | ICD-10-CM | POA: Insufficient documentation

## 2017-06-16 DIAGNOSIS — S79911A Unspecified injury of right hip, initial encounter: Secondary | ICD-10-CM | POA: Diagnosis not present

## 2017-06-16 DIAGNOSIS — Y9389 Activity, other specified: Secondary | ICD-10-CM | POA: Insufficient documentation

## 2017-06-16 DIAGNOSIS — W19XXXA Unspecified fall, initial encounter: Secondary | ICD-10-CM

## 2017-06-16 DIAGNOSIS — T148XXA Other injury of unspecified body region, initial encounter: Secondary | ICD-10-CM | POA: Diagnosis not present

## 2017-06-16 DIAGNOSIS — G8911 Acute pain due to trauma: Secondary | ICD-10-CM | POA: Diagnosis not present

## 2017-06-16 DIAGNOSIS — S32511A Fracture of superior rim of right pubis, initial encounter for closed fracture: Secondary | ICD-10-CM | POA: Diagnosis not present

## 2017-06-16 DIAGNOSIS — M533 Sacrococcygeal disorders, not elsewhere classified: Secondary | ICD-10-CM | POA: Diagnosis not present

## 2017-06-16 DIAGNOSIS — S32591A Other specified fracture of right pubis, initial encounter for closed fracture: Secondary | ICD-10-CM | POA: Insufficient documentation

## 2017-06-16 DIAGNOSIS — S3993XA Unspecified injury of pelvis, initial encounter: Secondary | ICD-10-CM | POA: Diagnosis present

## 2017-06-16 DIAGNOSIS — S12100A Unspecified displaced fracture of second cervical vertebra, initial encounter for closed fracture: Secondary | ICD-10-CM | POA: Diagnosis not present

## 2017-06-16 LAB — BASIC METABOLIC PANEL
Anion gap: 11 (ref 5–15)
BUN: 21 mg/dL — ABNORMAL HIGH (ref 6–20)
CALCIUM: 8.4 mg/dL — AB (ref 8.9–10.3)
CO2: 22 mmol/L (ref 22–32)
Chloride: 103 mmol/L (ref 101–111)
Creatinine, Ser: 0.81 mg/dL (ref 0.44–1.00)
GFR calc Af Amer: >60 mL/min (ref >60)
GFR calc non Af Amer: >60 mL/min (ref >60)
Glucose, Bld: 130 mg/dL — ABNORMAL HIGH (ref 65–99)
Potassium: 3.7 mmol/L (ref 3.5–5.1)
Sodium: 136 mmol/L (ref 135–145)

## 2017-06-16 LAB — CBC WITH DIFFERENTIAL/PLATELET
Basophils Absolute: 0 10*3/uL (ref 0.0–0.1)
Basophils Relative: 0 %
Eosinophils Absolute: 0 10*3/uL (ref 0.0–0.7)
Eosinophils Relative: 0 %
HCT: 36.8 % (ref 36.0–46.0)
Hemoglobin: 12.3 g/dL (ref 12.0–15.0)
LYMPHS PCT: 5 %
Lymphs Abs: 0.9 10*3/uL (ref 0.7–4.0)
MCH: 31 pg (ref 26.0–34.0)
MCHC: 33.4 g/dL (ref 30.0–36.0)
MCV: 92.7 fL (ref 78.0–100.0)
Monocytes Absolute: 1 10*3/uL (ref 0.1–1.0)
Monocytes Relative: 6 %
Neutro Abs: 14.8 10*3/uL — ABNORMAL HIGH (ref 1.7–7.7)
Neutrophils Relative %: 88 %
PLATELETS: 231 10*3/uL (ref 150–400)
RBC: 3.97 MIL/uL (ref 3.87–5.11)
RDW: 14.4 % (ref 11.5–15.5)
WBC: 16.8 10*3/uL — AB (ref 4.0–10.5)

## 2017-06-16 LAB — URINALYSIS, ROUTINE W REFLEX MICROSCOPIC
Bilirubin Urine: NEGATIVE
GLUCOSE, UA: NEGATIVE mg/dL
Hgb urine dipstick: NEGATIVE
Ketones, ur: NEGATIVE mg/dL
Nitrite: NEGATIVE
Protein, ur: NEGATIVE mg/dL
Specific Gravity, Urine: 1.013 (ref 1.005–1.030)
pH: 6 (ref 5.0–8.0)

## 2017-06-16 MED ORDER — HYDROCODONE-ACETAMINOPHEN 5-325 MG PO TABS
1.0000 | ORAL_TABLET | Freq: Once | ORAL | Status: AC
  Administered 2017-06-16: 1 via ORAL
  Filled 2017-06-16: qty 1

## 2017-06-16 MED ORDER — IBUPROFEN 600 MG PO TABS: 600.0000 mg | ORAL_TABLET | Freq: Four times a day (QID) | ORAL | 0 refills | Status: DC | PRN

## 2017-06-16 MED ORDER — ACETAMINOPHEN 325 MG PO TABS
650.0000 mg | ORAL_TABLET | Freq: Once | ORAL | Status: AC
  Administered 2017-06-16: 650 mg via ORAL
  Filled 2017-06-16: qty 2

## 2017-06-16 MED ORDER — HYDROCODONE-ACETAMINOPHEN 5-325 MG PO TABS: 1.0000 | ORAL_TABLET | ORAL | 0 refills | Status: DC | PRN

## 2017-06-16 NOTE — ED Provider Notes (Signed)
[place] DEPT Provider Note   CSN: [id number] Arrival date & time: 06/16/17  1259     History   Chief Complaint Chief Complaint  Patient presents with  . Fall    HPI Laura Knight is a 83 y.o. female.  Laura Knight is a 83 y.o. Female who presents to the ED from [facility] after a slip and fall prior to arrival.  Patient usually uses a walker or cane and she did not use this to walk earlier today.  She lost her balance and fell onto her buttocks.  She denies hitting her head or loss of consciousness.  She reports pain to her tailbone.  She is not on anticoagulants.  She denies other complaints or pain.   Later, son is at bedside and reports that nursing facility feels she is slightly more confused than normal.  She does have a history of some dementia.  No fevers, chest pain, shortness of breath, coughing, numbness, tingling, weakness, loss of bladder control, dysuria, or rashes.   The history is provided by the patient, a relative and medical records. No language interpreter was used.  Fall  Pertinent negatives include no chest pain, no abdominal pain, no headaches and no shortness of breath.    Past Medical History:  Diagnosis Date  . Arthritis   . GERD (gastroesophageal reflux disease)   . History of blood transfusion yrs ago    Patient Active Problem List   Diagnosis Date Noted  . Esophageal reflux 07/09/2013  . Constipation 06/18/2013  . Osteoarthritis of left hip S/P Left Total Hip Arthroplasty 06/18/2013  . Expected blood loss anemia 06/05/2013  . S/P left THA, AA 06/04/2013    Past Surgical History:  Procedure Laterality Date  . ABDOMINAL HYSTERECTOMY     1 ovary removed  . bilateral knee replacment  2008  . JOINT REPLACEMENT    . right hip replacement  1977  . TOTAL HIP ARTHROPLASTY Left 06/04/2013   Procedure: LEFT TOTAL HIP ARTHROPLASTY ANTERIOR APPROACH;  Surgeon: Mauri Pole, MD;  Location:  [facility];  Service: Orthopedics;  Laterality: Left;    OB History    No data available       Home Medications    Prior to Admission medications   Medication Sig Start Date End Date Taking? Authorizing Provider  cetirizine (ZYRTEC) 10 MG tablet Take 10 mg by mouth daily.   Yes [provider]  Cholecalciferol (VITAMIN D PO) Take 1 tablet by mouth daily. 500 - 1000 MG ONCE DAILY   Yes [provider]  fluticasone (FLONASE) 50 MCG/ACT nasal spray Place 1 spray into both nostrils daily. 03/22/17  Yes [provider]  predniSONE (DELTASONE) 10 MG tablet Take 10 mg by mouth daily. 06/08/17  Yes [provider]  benzonatate (TESSALON) 100 MG capsule Take 1 capsule (100 mg total) by mouth every 8 (eight) hours. Patient not taking: Reported on 06/16/2017 08/07/14   Harvie Heck, PA-C  carbamide peroxide (DEBROX) 6.5 % otic solution Place 5 drops into both ears 2 (two) times daily. x5 days Patient not taking: Reported on 06/16/2017 08/10/14   Street, [place], PA-C  cephALEXin (KEFLEX) 500 MG capsule Take 1 capsule (500 mg total) by mouth 4 (four) times daily. Patient not taking: Reported on 08/07/2014 07/25/13   Lacretia Leigh, MD  ferrous sulfate 325 (65 FE) MG tablet Take 1 tablet (325 mg total) by mouth 3 (three) times daily after meals. Patient not taking: Reported  on 08/07/2014 06/06/13   Danae Orleans, PA-C  HYDROcodone-acetaminophen (NORCO/VICODIN) 5-325 MG per tablet Take 1-2 tablets by mouth every 4 (four) hours as needed. Patient not taking: Reported on 08/07/2014 07/25/13   Lacretia Leigh, MD  phenazopyridine (PYRIDIUM) 200 MG tablet Take 1 tablet (200 mg total) by mouth 3 (three) times daily. Patient not taking: Reported on 08/07/2014 07/25/13   Lacretia Leigh, MD    Family History History reviewed. No pertinent family history.  Social History Social History  Substance Use Topics  . Smoking status: Former Smoker    Packs/day: 0.50    Years:  12.00    Types: Cigarettes  . Smokeless tobacco: Never Used     Comment: quit smoking age 85  . Alcohol use Yes     Comment: occasional wine     Allergies   Amoxicillin; Sulfa antibiotics; and Sulfamethoxazole   Review of Systems Review of Systems  Constitutional: Negative for chills and fever.  HENT: Negative for congestion and sore throat.   Eyes: Negative for visual disturbance.  Respiratory: Negative for cough, shortness of breath and wheezing.   Cardiovascular: Negative for chest pain.  Gastrointestinal: Negative for abdominal pain, diarrhea, nausea and vomiting.  Genitourinary: Negative for dysuria.  Musculoskeletal: Positive for arthralgias and back pain. Negative for neck pain.  Skin: Negative for rash and wound.  Neurological: Negative for dizziness, syncope, weakness, light-headedness, numbness and headaches.     Physical Exam Updated Vital Signs BP (!) 153/68 (BP Location: Right Arm)   Pulse 84   Temp 98 F (36.7 C) (Oral)   Resp 18   Wt 63.5 kg (140 lb)   SpO2 90%   BMI 24.80 kg/m   Physical Exam  Constitutional: She appears well-developed and well-nourished. No distress.  Nontoxic-appearing.  HENT:  Head: Normocephalic and atraumatic.  Right Ear: External ear normal.  Left Ear: External ear normal.  Eyes: Pupils are equal, round, and reactive to light. Conjunctivae are normal. Right eye exhibits no discharge. Left eye exhibits no discharge.  Neck: Neck supple.  Cardiovascular: Normal rate, regular rhythm, normal heart sounds and intact distal pulses.   Bilateral radial, posterior tibialis and dorsalis pedis pulses are intact.    Pulmonary/Chest: Effort normal and breath sounds normal. No respiratory distress. She has no wheezes. She has no rales. She exhibits no tenderness.  Abdominal: Soft. There is no tenderness. There is no guarding.  Musculoskeletal: Normal range of motion. She exhibits tenderness. She exhibits no edema or deformity.  Patient has  tenderness to palpation to her lower lumbar spine into her right buttocks.  No pain with range of motion at her bilateral hips.  No pain with range of motion in her bilateral knees.  No deformity noted.  No shortening or rotation of her legs.  No crepitus or deformity noted.  Lymphadenopathy:    She has no cervical adenopathy.  Neurological: She is alert. No cranial nerve deficit or sensory deficit. She exhibits normal muscle tone. Coordination normal.  Patient is alert and oriented to person, place and event.  She has some difficulty with the year on exam.  Her speech is clear and coherent.  Sensation is intact to her bilateral upper and lower extremities.  Cranial nerves are intact.  Skin: Skin is warm and dry. Capillary refill takes less than 2 seconds. No rash noted. She is not diaphoretic. No erythema. No pallor.  Psychiatric: She has a normal mood and affect. Her behavior is normal.  Nursing note and vitals reviewed.  ED Treatments / Results  Labs (all labs ordered are listed, but only abnormal results are displayed) Labs Reviewed  URINALYSIS, ROUTINE W REFLEX MICROSCOPIC  BASIC METABOLIC PANEL  CBC WITH DIFFERENTIAL/PLATELET    EKG  EKG Interpretation None       Radiology Dg Lumbar Spine Complete  Result Date: 06/16/2017 CLINICAL DATA:  Initial evaluation for acute trauma, fall, left hip and back pain. EXAM: LUMBAR SPINE - COMPLETE 4+ VIEW COMPARISON:  None. FINDINGS: 5 non rib-bearing lumbar type vertebral bodies. Levoscoliosis with apex at L3-4. Vertebral bodies otherwise normally aligned with preservation of the normal lumbar lordosis. Vertebral body heights grossly maintained. No definite acute fracture, although evaluation limited by marked did osteopenia. Visualized sacrum grossly intact. Advanced degenerative intervertebral disc space narrowing present at L2-3 through L5-S1. Bilateral hip arthroplasties partially visualized. Extensive aortic atherosclerosis. IMPRESSION:  1. No radiographic evidence for acute traumatic injury within the lumbar spine. 2. Levoscoliosis with advanced degenerative spondylolysis at L2-3 through L5-S1. 3. Extensive aortic atherosclerosis. Electronically Signed   By: Jeannine Boga M.D.   On: 06/16/2017 16:12   Dg Hip Unilat With Pelvis 2-3 Views Right  Result Date: 06/16/2017 CLINICAL DATA:  Fall, injury, pain EXAM: DG HIP (WITH OR WITHOUT PELVIS) 2-3V RIGHT COMPARISON:  06/16/2017 FINDINGS: Subtle cortical irregularity and step-off of the right superior ramus medially suspicious for acute fracture. Bones are osteopenic. Bilateral hip arthroplasties noted. No hip hardware malalignment or acute finding by plain radiography. Advanced degenerative changes of the lumbosacral spine with an associated dextroscoliosis. Nonobstructive bowel gas pattern.  Peripheral atherosclerosis noted. IMPRESSION: Findings suspicious for acute nondisplaced right superior ramus fracture medially. Osteopenia and degenerative changes Bilateral hip arthroplasties. Electronically Signed   By: Jerilynn Mages.  Shick M.D.   On: 06/16/2017 16:11    Procedures Procedures (including critical care time)  Medications Ordered in ED Medications  acetaminophen (TYLENOL) tablet 650 mg (not administered)  HYDROcodone-acetaminophen (NORCO/VICODIN) 5-325 MG per tablet 1 tablet (not administered)     Initial Impression / Assessment and Plan / ED Course  I have reviewed the triage vital signs and the nursing notes.  Pertinent labs & imaging results that were available during my care of the patient were reviewed by me and considered in my medical decision making (see chart for details).    This  is a 83 y.o. Female who presents to the ED from [facility] assisted [facility] after a slip and fall prior to arrival.  Patient usually uses a walker or cane and she did not use this to walk earlier today.  She lost her balance and fell onto her buttocks.  She denies hitting her head or loss  of consciousness.  She reports pain to her tailbone.  She is not on anticoagulants.  She denies other complaints or pain.   X-rays were obtained of her lumbar spine and right hip and pelvis.  Right hip and pelvis x-ray shows findings concerning for an acute nondisplaced right superior ramus fracture.  Later, son is at bedside and reports that nursing facility feels she is slightly more confused than normal.  He would like [place] to check her urine.  Will check UA and blood work.  I discussed x-ray results.  She can weight-bear as tolerated and follow-up with her orthopedic surgeon Dr. Alvan Dame.   At shift change patient is awaiting UA and blood work. Patient care signed out to Domenic Moras, PA-C at shift change who will disposition the patient after test results.   This patient was discussed  with and evaluated by Dr. Roxanne Mins who agrees with assessment and plan.  Final Clinical Impressions(s) / ED Diagnoses   Final diagnoses:  Closed fracture of ramus of right pubis, initial encounter [facility])  Fall, initial encounter    New Prescriptions New Prescriptions   No medications on file     Waynetta Pean, Hershal Coria [birth year] 0254    Delora Fuel, MD 86/28/24 [phone]

## 2017-06-16 NOTE — ED Notes (Signed)
Bed: WA06 Expected date:  Expected time:  Means of arrival:  Comments: [place] Crane B

## 2017-06-16 NOTE — ED Notes (Signed)
Per Levada Dy, RN  Tim from [facility] is to be looking for staff for pt to go home tonight.

## 2017-06-16 NOTE — ED Notes (Signed)
Bed: [facility] Expected date:  Expected time:  Means of arrival:  Comments: 83yo F, fall/tailbone pain

## 2017-06-16 NOTE — ED Provider Notes (Signed)
Received signout at beginning of shift.  This 83 year old female slipped and fell, landed on her buttocks. Xray shows finding concerning for an acute nondisplaced right superior ramus fracture.  Labs obtain showing elevated WBC, likely reactive.  UA without evidence of UTI. Pt may bear weight as tolerated and f/u with orthopedist Dr. Alvan Dame.  Pain medication given.  In order to decrease risk of narcotic abuse. Pt's record were checked using the [place] Controlled Substance database.   6:22 PM I discuss the plan with pt and with son who is at bedside. Son felt pt cannot return back to her independent living facility due to inability to ambulate and due to pain.  He request pt to be admitted for further management of her symptoms with goal to enter rehab facility.    6:39 PM  I have discussed with our case manager who will help set up PT/OT/RN/AID for outpt management.    7:34 PM Case Manager have evaluated pt, and currently working on getting the appropriate outpatient care management.    BP (!) 153/68 (BP Location: Right Arm)   Pulse 84   Temp 98 F (36.7 C) (Oral)   Resp 18   Wt 63.5 kg (140 lb)   SpO2 90%   BMI 24.80 kg/m   Results for orders placed or performed during the hospital encounter of 06/16/17  Urinalysis, Routine w reflex microscopic  Result Value Ref Range   Color, Urine YELLOW YELLOW   APPearance CLEAR CLEAR   Specific Gravity, Urine 1.013 1.005 - 1.030   pH 6.0 5.0 - 8.0   Glucose, UA NEGATIVE NEGATIVE mg/dL   Hgb urine dipstick NEGATIVE NEGATIVE   Bilirubin Urine NEGATIVE NEGATIVE   Ketones, ur NEGATIVE NEGATIVE mg/dL   Protein, ur NEGATIVE NEGATIVE mg/dL   Nitrite NEGATIVE NEGATIVE   Leukocytes, UA TRACE (A) NEGATIVE   RBC / HPF 0-5 0 - 5 RBC/hpf   WBC, UA 0-5 0 - 5 WBC/hpf   Bacteria, UA RARE (A) NONE SEEN   Squamous Epithelial / LPF 0-5 (A) NONE SEEN   Mucus PRESENT   Basic metabolic panel  Result Value Ref Range   Sodium 136 135 - 145 mmol/L   Potassium 3.7  3.5 - 5.1 mmol/L   Chloride 103 101 - 111 mmol/L   CO2 22 22 - 32 mmol/L   Glucose, Bld 130 (H) 65 - 99 mg/dL   BUN 21 (H) 6 - 20 mg/dL   Creatinine, Ser 0.81 0.44 - 1.00 mg/dL   Calcium 8.4 (L) 8.9 - 10.3 mg/dL   GFR calc non Af Amer >60 >60 mL/min   GFR calc Af Amer >60 >60 mL/min   Anion gap 11 5 - 15  CBC with Differential  Result Value Ref Range   WBC 16.8 (H) 4.0 - 10.5 K/uL   RBC 3.97 3.87 - 5.11 MIL/uL   Hemoglobin 12.3 12.0 - 15.0 g/dL   HCT 36.8 36.0 - 46.0 %   MCV 92.7 78.0 - 100.0 fL   MCH 31.0 26.0 - 34.0 pg   MCHC 33.4 30.0 - 36.0 g/dL   RDW 14.4 11.5 - 15.5 %   Platelets 231 150 - 400 K/uL   Neutrophils Relative % 88 %   Neutro Abs 14.8 (H) 1.7 - 7.7 K/uL   Lymphocytes Relative 5 %   Lymphs Abs 0.9 0.7 - 4.0 K/uL   Monocytes Relative 6 %   Monocytes Absolute 1.0 0.1 - 1.0 K/uL   Eosinophils Relative 0 %  Eosinophils Absolute 0.0 0.0 - 0.7 K/uL   Basophils Relative 0 %   Basophils Absolute 0.0 0.0 - 0.1 K/uL   Dg Lumbar Spine Complete  Result Date: 06/16/2017 CLINICAL DATA:  Initial evaluation for acute trauma, fall, left hip and back pain. EXAM: LUMBAR SPINE - COMPLETE 4+ VIEW COMPARISON:  None. FINDINGS: 5 non rib-bearing lumbar type vertebral bodies. Levoscoliosis with apex at L3-4. Vertebral bodies otherwise normally aligned with preservation of the normal lumbar lordosis. Vertebral body heights grossly maintained. No definite acute fracture, although evaluation limited by marked did osteopenia. Visualized sacrum grossly intact. Advanced degenerative intervertebral disc space narrowing present at L2-3 through L5-S1. Bilateral hip arthroplasties partially visualized. Extensive aortic atherosclerosis. IMPRESSION: 1. No radiographic evidence for acute traumatic injury within the lumbar spine. 2. Levoscoliosis with advanced degenerative spondylolysis at L2-3 through L5-S1. 3. Extensive aortic atherosclerosis. Electronically Signed   By: Jeannine Boga M.D.    On: 06/16/2017 16:12   Dg Hip Unilat With Pelvis 2-3 Views Right  Result Date: 06/16/2017 CLINICAL DATA:  Fall, injury, pain EXAM: DG HIP (WITH OR WITHOUT PELVIS) 2-3V RIGHT COMPARISON:  06/16/2017 FINDINGS: Subtle cortical irregularity and step-off of the right superior ramus medially suspicious for acute fracture. Bones are osteopenic. Bilateral hip arthroplasties noted. No hip hardware malalignment or acute finding by plain radiography. Advanced degenerative changes of the lumbosacral spine with an associated dextroscoliosis. Nonobstructive bowel gas pattern.  Peripheral atherosclerosis noted. IMPRESSION: Findings suspicious for acute nondisplaced right superior ramus fracture medially. Osteopenia and degenerative changes Bilateral hip arthroplasties. Electronically Signed   By: Jerilynn Mages.  Shick M.D.   On: 06/16/2017 16:11       Domenic Moras, PA-C 67/20/94 [id number]    Delora Fuel, MD 28/36/62 0900

## 2017-06-16 NOTE — Progress Notes (Addendum)
CSW reviewed chart with CM and staffed case with CSW Asst Director who, according to the review of the notes states that D/C home with [facility] and or Aide in the home is appropriate. Pt's need is not skillable but custodial.  CSW and CM will meet with pt.  8:32 PM   CSW and CM met with pt and pt's son Xavia Kniskern who initially stated he would refuse to D/C back home and would either allow his mother (the pt) to be admitted or the pt would remain in the ED overnight to be placed at [facility] first thing in the morning.  CM explained pt cannot stay overnight to be placed and it would be impossible to place pt at [facility] at night because staff that handle intake and admitting have left.    [name] presented as abrasive in conversaton with the CM AEB pt telling CM "it is not a lot of hoopla to get the pt admitted because my (his other relative) was placed at [facility] and it was no big deal" and raised his voice to the CM in several instances.  When CM stated she would initiate search for in-home 24 hour aide the pt's son stated "she's not going anywhere without 24 hour help".  CM stated she would call Tim with [facility].    CSW called Octavia Bruckner and is meeting him in the ED currently.   Alphonse Guild. Gerritt Galentine, LCSW, LCAS, CSI Clinical Social Worker Ph: [phone]

## 2017-06-16 NOTE — ED Triage Notes (Signed)
Patient BIB [facility] from [facility]. Patient reports she was walking to her mailbox at approx 12p when she slipped and fell backwards, landing on her bottom. Patient endorses buttock/tailbone pain at this time. Patient denies hitting head/LOC. Patient denies taking bloodthinners. Patient uses cane for ambulation.

## 2017-06-16 NOTE — ED Notes (Addendum)
Son states "My mother was due a pain pill at 10 pm. She needs it now. Why don't you have that information?" Provider made aware.

## 2017-06-16 NOTE — ED Notes (Signed)
Corliss Blacker, RN at bedside at this time.

## 2017-06-16 NOTE — Discharge Instructions (Signed)
You have been evaluated for a fall.  You have a small break in your pelvis on the right side.  You may bear weight, take pain medication as needed for pain and follow up with your doctor or orthopedist for further care.

## 2017-06-16 NOTE — Care Management Note (Signed)
Case Management Note  Patient Details  Name: Laura Knight MRN: [id number] Date of Birth: 06-Mar-1925  Subjective/Objective:                  83 year old female slipped and fell, landed on her buttocks. Xray shows finding concerning for an acute nondisplaced right superior ramus fracture.  Action/Plan: CM consulted for HHS and noted pt is from a facility.  Called facility attempting to find out which [facility] is contracted.  The front desk was not helpful but as CM was speaking with them the pt's daughter-in-law walked up.  CM spoke with her over the phone and she stated the pts LPN Shirlee More was reachable at this time of the day and that [facility] had concerns for the pt returning.  CM spoke with Shirlee More, [phone], who was advised of the plan of transition.  She stated [facility] is who they refer out for skilled services.  Spoke with Tim, rep for [facility] who accepted the pt.  CM asked CSW to go in to speak with pt and son as a team after CM was updated by the ED secretary that pts son had been demanding to speak with the charge RN, the AD, the director, or whoever else he could find that was "in charge."  Spoke with pt's son who was demanding and loud during the discussion.  He stated that the pt was either going to be admitted or she was staying in the ED but that she was not going home without 24/7 care.  CM advised son that there was no need for the pt to be admitted and that she was not appropriate to stay in the ED over night either.  The pt was set up with multiple HHS through [facility].  Son stated she was in too much pain and could no be left alone.  CM advised son that he would need to be able to pay for private duty aids which he could find their services online at [email or web address]://hill.[email or web address]  CM stated that CM was not able to help with setting those services up.  Pts son became angrier and stated that this CM would find those services for him or they would  stay in the ED or be admitted, that money was not a problem and to send her to [facility] immediately if needed.  He additionally stated that "I will get out my video camera and record all of you if you escort [place] out of here without helping her and post it all over the Internet."  CM advised that the pt going to [facility] was not an option, as their admission staff were no longer in the office.  CM advised the son that CM would contact Tim with [facility] who has access to 24/7 aid services and that Octavia Bruckner would be contacting him shortly via phone.  [name] was agreeable to transition plan.  Spoke with Octavia Bruckner who stated he would work on finding an aid immediately after he spoke with the son.  Provided son's phone number to Tim.  Asked him to let the pt's primary RN know when services were arranged so the pt could be transitioned from the ED back to [facility] via [facility].  Updated the ED secretary, updated Rona Ravens. ,PA, updated primary RN Sara Lee, updated charge RN, Karna Christmas, updated Roderic Palau CSW.  No further CM needs noted at this time.  Expected Discharge Date:    06/16/2017  Expected Discharge Plan:  [place]  In-House Referral:  Clinical Social Work  Discharge planning Services  [facility] Acute Care Choice:  Home Health and private duty Choice offered to:  Patient, Adult Children  HH Arranged:  RN, PT, OT, Nurse's Aide, Social Work [employer] Agency:  [facility] [facility] [employer]  Status of Service:  Completed, signed off  Dezaree Tracey, Benjaman Lobe, RN 06/16/2017, 7:05 PM

## 2017-06-16 NOTE — Progress Notes (Addendum)
CSW is leaving at 11:30pm.  Per nurse [facility] is en route.  If pt and/or pt's son refuses to leave or let the pt leave please contact the [facility], as the plan is for pt to return to [facility] with [facility] and a 24 hour aide as agreed upon by the pt, the pt's son and the [facility], as well as with Octavia Bruckner from [facility] at ph: [phone].  CSW will update RN and AC.  Please reconsult if future social work needs arise.  CSW signing off, as social work intervention is no longer needed.  Alphonse Guild. Feather Berrie, LCSW, LCAS, CSI Clinical Social Worker Ph: [phone]

## 2017-06-16 NOTE — Progress Notes (Signed)
CSW met with Tim from [facility] and confimed pt is going home to [facility] with 24 hour in-home aid and [place] will be instituted on 10/27.  Per Tim, pt and pt's son are agreeable.  CSW met with pt to see if pt or pt's son needed anything in addition and pt's son stated no.  CSW introduced caregiver/in-home aide who had just arrived to pt and pt's son.  Per RN, Corey Harold has been called.  Please reconsult if future social work needs arise.  CSW signing off, as social work intervention is no longer needed.  Alphonse Guild. Whitnee Orzel, LCSW, LCAS, CSI Clinical Social Worker Ph: [phone]

## 2017-06-16 NOTE — ED Notes (Signed)
[facility] En Route at this time

## 2017-06-23 DIAGNOSIS — Z9181 History of falling: Secondary | ICD-10-CM | POA: Diagnosis not present

## 2017-06-23 DIAGNOSIS — S32501D Unspecified fracture of right pubis, subsequent encounter for fracture with routine healing: Secondary | ICD-10-CM | POA: Diagnosis not present

## 2017-06-23 DIAGNOSIS — W19XXXD Unspecified fall, subsequent encounter: Secondary | ICD-10-CM | POA: Diagnosis not present

## 2017-06-23 DIAGNOSIS — Z96643 Presence of artificial hip joint, bilateral: Secondary | ICD-10-CM | POA: Diagnosis not present

## 2017-06-26 DIAGNOSIS — S32501D Unspecified fracture of right pubis, subsequent encounter for fracture with routine healing: Secondary | ICD-10-CM | POA: Diagnosis not present

## 2017-06-26 DIAGNOSIS — W19XXXD Unspecified fall, subsequent encounter: Secondary | ICD-10-CM | POA: Diagnosis not present

## 2017-06-26 DIAGNOSIS — Z9181 History of falling: Secondary | ICD-10-CM | POA: Diagnosis not present

## 2017-06-26 DIAGNOSIS — Z96643 Presence of artificial hip joint, bilateral: Secondary | ICD-10-CM | POA: Diagnosis not present

## 2017-06-28 DIAGNOSIS — W19XXXD Unspecified fall, subsequent encounter: Secondary | ICD-10-CM | POA: Diagnosis not present

## 2017-06-28 DIAGNOSIS — S32501D Unspecified fracture of right pubis, subsequent encounter for fracture with routine healing: Secondary | ICD-10-CM | POA: Diagnosis not present

## 2017-06-28 DIAGNOSIS — Z9181 History of falling: Secondary | ICD-10-CM | POA: Diagnosis not present

## 2017-06-28 DIAGNOSIS — Z96643 Presence of artificial hip joint, bilateral: Secondary | ICD-10-CM | POA: Diagnosis not present

## 2017-06-29 DIAGNOSIS — Z9181 History of falling: Secondary | ICD-10-CM | POA: Diagnosis not present

## 2017-06-29 DIAGNOSIS — Z96643 Presence of artificial hip joint, bilateral: Secondary | ICD-10-CM | POA: Diagnosis not present

## 2017-06-29 DIAGNOSIS — W19XXXD Unspecified fall, subsequent encounter: Secondary | ICD-10-CM | POA: Diagnosis not present

## 2017-06-29 DIAGNOSIS — S32501D Unspecified fracture of right pubis, subsequent encounter for fracture with routine healing: Secondary | ICD-10-CM | POA: Diagnosis not present

## 2017-06-30 DIAGNOSIS — S32501D Unspecified fracture of right pubis, subsequent encounter for fracture with routine healing: Secondary | ICD-10-CM | POA: Diagnosis not present

## 2017-06-30 DIAGNOSIS — M25572 Pain in left ankle and joints of left foot: Secondary | ICD-10-CM | POA: Diagnosis not present

## 2017-06-30 DIAGNOSIS — Z96643 Presence of artificial hip joint, bilateral: Secondary | ICD-10-CM | POA: Diagnosis not present

## 2017-06-30 DIAGNOSIS — W19XXXD Unspecified fall, subsequent encounter: Secondary | ICD-10-CM | POA: Diagnosis not present

## 2017-06-30 DIAGNOSIS — Z9181 History of falling: Secondary | ICD-10-CM | POA: Diagnosis not present

## 2017-07-03 DIAGNOSIS — S32501D Unspecified fracture of right pubis, subsequent encounter for fracture with routine healing: Secondary | ICD-10-CM | POA: Diagnosis not present

## 2017-07-03 DIAGNOSIS — W19XXXD Unspecified fall, subsequent encounter: Secondary | ICD-10-CM | POA: Diagnosis not present

## 2017-07-03 DIAGNOSIS — Z9181 History of falling: Secondary | ICD-10-CM | POA: Diagnosis not present

## 2017-07-03 DIAGNOSIS — Z96643 Presence of artificial hip joint, bilateral: Secondary | ICD-10-CM | POA: Diagnosis not present

## 2017-07-04 DIAGNOSIS — S32501D Unspecified fracture of right pubis, subsequent encounter for fracture with routine healing: Secondary | ICD-10-CM | POA: Diagnosis not present

## 2017-07-04 DIAGNOSIS — Z96643 Presence of artificial hip joint, bilateral: Secondary | ICD-10-CM | POA: Diagnosis not present

## 2017-07-04 DIAGNOSIS — Z9181 History of falling: Secondary | ICD-10-CM | POA: Diagnosis not present

## 2017-07-04 DIAGNOSIS — W19XXXD Unspecified fall, subsequent encounter: Secondary | ICD-10-CM | POA: Diagnosis not present

## 2017-07-05 DIAGNOSIS — Z9181 History of falling: Secondary | ICD-10-CM | POA: Diagnosis not present

## 2017-07-05 DIAGNOSIS — S32501D Unspecified fracture of right pubis, subsequent encounter for fracture with routine healing: Secondary | ICD-10-CM | POA: Diagnosis not present

## 2017-07-05 DIAGNOSIS — W19XXXD Unspecified fall, subsequent encounter: Secondary | ICD-10-CM | POA: Diagnosis not present

## 2017-07-05 DIAGNOSIS — Z96643 Presence of artificial hip joint, bilateral: Secondary | ICD-10-CM | POA: Diagnosis not present

## 2017-07-06 DIAGNOSIS — Z96643 Presence of artificial hip joint, bilateral: Secondary | ICD-10-CM | POA: Diagnosis not present

## 2017-07-06 DIAGNOSIS — Z9181 History of falling: Secondary | ICD-10-CM | POA: Diagnosis not present

## 2017-07-06 DIAGNOSIS — W19XXXD Unspecified fall, subsequent encounter: Secondary | ICD-10-CM | POA: Diagnosis not present

## 2017-07-06 DIAGNOSIS — S32501D Unspecified fracture of right pubis, subsequent encounter for fracture with routine healing: Secondary | ICD-10-CM | POA: Diagnosis not present

## 2017-07-07 DIAGNOSIS — W19XXXD Unspecified fall, subsequent encounter: Secondary | ICD-10-CM | POA: Diagnosis not present

## 2017-07-07 DIAGNOSIS — Z96643 Presence of artificial hip joint, bilateral: Secondary | ICD-10-CM | POA: Diagnosis not present

## 2017-07-07 DIAGNOSIS — S32501D Unspecified fracture of right pubis, subsequent encounter for fracture with routine healing: Secondary | ICD-10-CM | POA: Diagnosis not present

## 2017-07-07 DIAGNOSIS — Z9181 History of falling: Secondary | ICD-10-CM | POA: Diagnosis not present

## 2017-07-10 DIAGNOSIS — S32501D Unspecified fracture of right pubis, subsequent encounter for fracture with routine healing: Secondary | ICD-10-CM | POA: Diagnosis not present

## 2017-07-10 DIAGNOSIS — W19XXXD Unspecified fall, subsequent encounter: Secondary | ICD-10-CM | POA: Diagnosis not present

## 2017-07-10 DIAGNOSIS — Z9181 History of falling: Secondary | ICD-10-CM | POA: Diagnosis not present

## 2017-07-10 DIAGNOSIS — Z96643 Presence of artificial hip joint, bilateral: Secondary | ICD-10-CM | POA: Diagnosis not present

## 2017-07-11 DIAGNOSIS — Z96643 Presence of artificial hip joint, bilateral: Secondary | ICD-10-CM | POA: Diagnosis not present

## 2017-07-11 DIAGNOSIS — Z9181 History of falling: Secondary | ICD-10-CM | POA: Diagnosis not present

## 2017-07-11 DIAGNOSIS — W19XXXD Unspecified fall, subsequent encounter: Secondary | ICD-10-CM | POA: Diagnosis not present

## 2017-07-11 DIAGNOSIS — S32501D Unspecified fracture of right pubis, subsequent encounter for fracture with routine healing: Secondary | ICD-10-CM | POA: Diagnosis not present

## 2017-07-12 DIAGNOSIS — S32501D Unspecified fracture of right pubis, subsequent encounter for fracture with routine healing: Secondary | ICD-10-CM | POA: Diagnosis not present

## 2017-07-12 DIAGNOSIS — W19XXXD Unspecified fall, subsequent encounter: Secondary | ICD-10-CM | POA: Diagnosis not present

## 2017-07-12 DIAGNOSIS — Z96643 Presence of artificial hip joint, bilateral: Secondary | ICD-10-CM | POA: Diagnosis not present

## 2017-07-12 DIAGNOSIS — Z9181 History of falling: Secondary | ICD-10-CM | POA: Diagnosis not present

## 2017-07-14 DIAGNOSIS — Z9181 History of falling: Secondary | ICD-10-CM | POA: Diagnosis not present

## 2017-07-14 DIAGNOSIS — S32501D Unspecified fracture of right pubis, subsequent encounter for fracture with routine healing: Secondary | ICD-10-CM | POA: Diagnosis not present

## 2017-07-14 DIAGNOSIS — W19XXXD Unspecified fall, subsequent encounter: Secondary | ICD-10-CM | POA: Diagnosis not present

## 2017-07-14 DIAGNOSIS — Z96643 Presence of artificial hip joint, bilateral: Secondary | ICD-10-CM | POA: Diagnosis not present

## 2017-07-17 DIAGNOSIS — Z96643 Presence of artificial hip joint, bilateral: Secondary | ICD-10-CM | POA: Diagnosis not present

## 2017-07-17 DIAGNOSIS — S32501D Unspecified fracture of right pubis, subsequent encounter for fracture with routine healing: Secondary | ICD-10-CM | POA: Diagnosis not present

## 2017-07-17 DIAGNOSIS — W19XXXD Unspecified fall, subsequent encounter: Secondary | ICD-10-CM | POA: Diagnosis not present

## 2017-07-17 DIAGNOSIS — Z9181 History of falling: Secondary | ICD-10-CM | POA: Diagnosis not present

## 2017-07-18 DIAGNOSIS — Z9181 History of falling: Secondary | ICD-10-CM | POA: Diagnosis not present

## 2017-07-18 DIAGNOSIS — Z96643 Presence of artificial hip joint, bilateral: Secondary | ICD-10-CM | POA: Diagnosis not present

## 2017-07-18 DIAGNOSIS — S32501D Unspecified fracture of right pubis, subsequent encounter for fracture with routine healing: Secondary | ICD-10-CM | POA: Diagnosis not present

## 2017-07-18 DIAGNOSIS — W19XXXD Unspecified fall, subsequent encounter: Secondary | ICD-10-CM | POA: Diagnosis not present

## 2017-07-19 DIAGNOSIS — Z9181 History of falling: Secondary | ICD-10-CM | POA: Diagnosis not present

## 2017-07-19 DIAGNOSIS — S32501D Unspecified fracture of right pubis, subsequent encounter for fracture with routine healing: Secondary | ICD-10-CM | POA: Diagnosis not present

## 2017-07-19 DIAGNOSIS — W19XXXD Unspecified fall, subsequent encounter: Secondary | ICD-10-CM | POA: Diagnosis not present

## 2017-07-19 DIAGNOSIS — Z96643 Presence of artificial hip joint, bilateral: Secondary | ICD-10-CM | POA: Diagnosis not present

## 2017-07-20 DIAGNOSIS — S32591D Other specified fracture of right pubis, subsequent encounter for fracture with routine healing: Secondary | ICD-10-CM | POA: Diagnosis not present

## 2017-07-20 DIAGNOSIS — S32501D Unspecified fracture of right pubis, subsequent encounter for fracture with routine healing: Secondary | ICD-10-CM | POA: Diagnosis not present

## 2017-07-20 DIAGNOSIS — W19XXXD Unspecified fall, subsequent encounter: Secondary | ICD-10-CM | POA: Diagnosis not present

## 2017-07-20 DIAGNOSIS — Z96643 Presence of artificial hip joint, bilateral: Secondary | ICD-10-CM | POA: Diagnosis not present

## 2017-07-20 DIAGNOSIS — Z9181 History of falling: Secondary | ICD-10-CM | POA: Diagnosis not present

## 2017-07-25 DIAGNOSIS — W19XXXD Unspecified fall, subsequent encounter: Secondary | ICD-10-CM | POA: Diagnosis not present

## 2017-07-25 DIAGNOSIS — S32501D Unspecified fracture of right pubis, subsequent encounter for fracture with routine healing: Secondary | ICD-10-CM | POA: Diagnosis not present

## 2017-07-25 DIAGNOSIS — Z96643 Presence of artificial hip joint, bilateral: Secondary | ICD-10-CM | POA: Diagnosis not present

## 2017-07-25 DIAGNOSIS — Z9181 History of falling: Secondary | ICD-10-CM | POA: Diagnosis not present

## 2017-07-26 DIAGNOSIS — Z96643 Presence of artificial hip joint, bilateral: Secondary | ICD-10-CM | POA: Diagnosis not present

## 2017-07-26 DIAGNOSIS — S32501D Unspecified fracture of right pubis, subsequent encounter for fracture with routine healing: Secondary | ICD-10-CM | POA: Diagnosis not present

## 2017-07-26 DIAGNOSIS — W19XXXD Unspecified fall, subsequent encounter: Secondary | ICD-10-CM | POA: Diagnosis not present

## 2017-07-26 DIAGNOSIS — Z9181 History of falling: Secondary | ICD-10-CM | POA: Diagnosis not present

## 2017-07-27 DIAGNOSIS — Z96643 Presence of artificial hip joint, bilateral: Secondary | ICD-10-CM | POA: Diagnosis not present

## 2017-07-27 DIAGNOSIS — S32501D Unspecified fracture of right pubis, subsequent encounter for fracture with routine healing: Secondary | ICD-10-CM | POA: Diagnosis not present

## 2017-07-27 DIAGNOSIS — W19XXXD Unspecified fall, subsequent encounter: Secondary | ICD-10-CM | POA: Diagnosis not present

## 2017-07-27 DIAGNOSIS — Z9181 History of falling: Secondary | ICD-10-CM | POA: Diagnosis not present

## 2017-07-29 DIAGNOSIS — W19XXXD Unspecified fall, subsequent encounter: Secondary | ICD-10-CM | POA: Diagnosis not present

## 2017-07-29 DIAGNOSIS — S32501D Unspecified fracture of right pubis, subsequent encounter for fracture with routine healing: Secondary | ICD-10-CM | POA: Diagnosis not present

## 2017-07-29 DIAGNOSIS — Z96643 Presence of artificial hip joint, bilateral: Secondary | ICD-10-CM | POA: Diagnosis not present

## 2017-07-29 DIAGNOSIS — Z9181 History of falling: Secondary | ICD-10-CM | POA: Diagnosis not present

## 2017-08-01 DIAGNOSIS — Z96643 Presence of artificial hip joint, bilateral: Secondary | ICD-10-CM | POA: Diagnosis not present

## 2017-08-01 DIAGNOSIS — W19XXXD Unspecified fall, subsequent encounter: Secondary | ICD-10-CM | POA: Diagnosis not present

## 2017-08-01 DIAGNOSIS — Z9181 History of falling: Secondary | ICD-10-CM | POA: Diagnosis not present

## 2017-08-01 DIAGNOSIS — S32501D Unspecified fracture of right pubis, subsequent encounter for fracture with routine healing: Secondary | ICD-10-CM | POA: Diagnosis not present

## 2017-08-02 DIAGNOSIS — S32591D Other specified fracture of right pubis, subsequent encounter for fracture with routine healing: Secondary | ICD-10-CM | POA: Diagnosis not present

## 2017-08-02 DIAGNOSIS — W19XXXD Unspecified fall, subsequent encounter: Secondary | ICD-10-CM | POA: Diagnosis not present

## 2017-08-02 DIAGNOSIS — S32501D Unspecified fracture of right pubis, subsequent encounter for fracture with routine healing: Secondary | ICD-10-CM | POA: Diagnosis not present

## 2017-08-02 DIAGNOSIS — M545 Low back pain: Secondary | ICD-10-CM | POA: Diagnosis not present

## 2017-08-02 DIAGNOSIS — Z9181 History of falling: Secondary | ICD-10-CM | POA: Diagnosis not present

## 2017-08-02 DIAGNOSIS — Z96643 Presence of artificial hip joint, bilateral: Secondary | ICD-10-CM | POA: Diagnosis not present

## 2017-08-02 DIAGNOSIS — M48061 Spinal stenosis, lumbar region without neurogenic claudication: Secondary | ICD-10-CM | POA: Diagnosis not present

## 2017-08-02 DIAGNOSIS — M5136 Other intervertebral disc degeneration, lumbar region: Secondary | ICD-10-CM | POA: Diagnosis not present

## 2017-08-03 DIAGNOSIS — M5136 Other intervertebral disc degeneration, lumbar region: Secondary | ICD-10-CM | POA: Diagnosis not present

## 2017-08-03 DIAGNOSIS — M545 Low back pain: Secondary | ICD-10-CM | POA: Diagnosis not present

## 2017-08-04 ENCOUNTER — Ambulatory Visit (INDEPENDENT_AMBULATORY_CARE_PROVIDER_SITE_OTHER): Payer: Medicare Other | Admitting: Interventional Cardiology

## 2017-08-04 ENCOUNTER — Encounter: Payer: Self-pay | Admitting: Interventional Cardiology

## 2017-08-04 VITALS — BP 146/66 | HR 74 | Ht 63.0 in | Wt 134.4 lb

## 2017-08-04 DIAGNOSIS — I350 Nonrheumatic aortic (valve) stenosis: Secondary | ICD-10-CM | POA: Insufficient documentation

## 2017-08-04 DIAGNOSIS — I714 Abdominal aortic aneurysm, without rupture, unspecified: Secondary | ICD-10-CM

## 2017-08-04 NOTE — Progress Notes (Signed)
Cardiology Office Note    Date:  08/04/2017   ID:  Laura Knight, DOB 01/05/1925, MRN [id number]  PCP:  Merlene Laughter, MD  Cardiologist: Sinclair Grooms, MD   Chief Complaint  Patient presents with  . Cardiac Valve Problem  . AAA    History of Present Illness:  Laura Knight is a 83 y.o. female referred by Dr. Paralee Cancel for evaluation and longitudinal follow-up of a coincidentally identified abdominal aortic aneurysm measuring 3.2 x 3.4 cm on a recent MRI done to evaluate the lumbar spine.  The patient is a pleasant elderly female who has no cardiac complaints.  As noted above she was coincidentally found to have a small abdominal aneurysm.  She is age 68.  There is no history of heart disease, hypertension, or diabetes.  She does have significant lumbosacral degenerative spine/disc disease.  Chronic back pain and pain in her right leg.  Recent fall with fracture of her left leg.  She was walking with a cane but ordinarily walks with a 4 pronged walker.  On the day of the fall she was using only her cane.  She denies syncope.  Specifically, she denies orthopnea, PND, chest pain, and lower extremity edema.  No history of heart murmur.  No prior history of cigarette smoking.    Past Medical History:  Diagnosis Date  . Acute bilateral low back pain without sciatica   . Aftercare following surgery of the musculoskeletal system   . Arthritis   . Contusion of left hip    initial encounter  . GERD (gastroesophageal reflux disease)   . History of blood transfusion yrs ago  . Left hip pain   . Osteoarthritis of left hip    unspecified osteoarthritis type  . 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 Status post bilateral hip replacements   . Trochanteric bursitis, right hip     Past Surgical History:  Procedure Laterality Date  . ABDOMINAL HYSTERECTOMY     1 ovary removed  . bilateral knee replacment  2008  . JOINT REPLACEMENT    . right  hip replacement  1977  . TOTAL HIP ARTHROPLASTY Left 06/04/2013   Procedure: LEFT TOTAL HIP ARTHROPLASTY ANTERIOR APPROACH;  Surgeon: Mauri Pole, MD;  Location: [facility];  Service: Orthopedics;  Laterality: Left;    Current Medications: Outpatient Medications Prior to Visit  Medication Sig Dispense Refill  . cetirizine (ZYRTEC) 10 MG tablet Take 10 mg by mouth daily.    . Cholecalciferol (VITAMIN D PO) Take 1 tablet by mouth daily. 500 - 1000 MG ONCE DAILY    . fluticasone (FLONASE) 50 MCG/ACT nasal spray Place 1 spray into both nostrils daily.  5  . HYDROcodone-acetaminophen (NORCO/VICODIN) 5-325 MG tablet Take 1 tablet by mouth every 4 (four) hours as needed for moderate pain or severe pain. 10 tablet 0  . ibuprofen (ADVIL,MOTRIN) 600 MG tablet Take 1 tablet (600 mg total) by mouth every 6 (six) hours as needed for mild pain or moderate pain. 20 tablet 0  . Multiple Vitamin (MULTI-DAY VITAMINS PO) Take 1 tablet by mouth daily.    . predniSONE (DELTASONE) 10 MG tablet Take 10 mg by mouth daily.  3   No facility-administered medications prior to visit.      Allergies:   Amoxicillin; Sulfa antibiotics; and Sulfamethoxazole   Social History   Socioeconomic History  . Marital status: Single    Spouse name: None  . Number of  children: 5  . Years of education: None  . Highest education level: None  Social Needs  . Financial resource strain: None  . Food insecurity - worry: None  . Food insecurity - inability: None  . Transportation needs - medical: None  . Transportation needs - non-medical: None  Occupational History  . None  Tobacco Use  . Smoking status: Former Smoker    Packs/day: 0.50    Years: 12.00    Pack years: 6.00    Types: Cigarettes  . Smokeless tobacco: Never Used  . Tobacco comment: quit smoking age 51  Substance and Sexual Activity  . Alcohol use: Yes    Comment: occasional wine  . Drug use: No  . Sexual activity: None  Other Topics Concern  . None    Social History Narrative  . None     Family History:  The patient's family history is not on file.   ROS:   Please see the history of present illness.    Right leg pain right lower back pain.  Recent history of pelvic fracture in November 2018. All other systems reviewed and are negative.   PHYSICAL EXAM:   VS:  BP (!) 146/66   Pulse 74   Ht 5\' 3"  (1.6 m)   Wt 134 lb 6.4 oz (61 kg)   BMI 23.81 kg/m    GEN: Well nourished, well developed, in no acute distress  HEENT: normal  Neck: no JVD, carotid bruits, or masses Cardiac: RRR.  She has a grade 4/6 crescendo decrescendo systolic murmur of aortic stenosis.  It does not radiate to the carotids.  No rubs, or gallops,no edema. Respiratory:  clear to auscultation bilaterally, normal work of breathing GI: soft, nontender, nondistended, + BS MS: no deformity or atrophy  Skin: warm and dry, no rash Neuro:  Alert and Oriented x 3, Strength and sensation are intact Psych: euthymic mood, full affect  Wt Readings from Last 3 Encounters:  08/04/17 134 lb 6.4 oz (61 kg)  06/16/17 140 lb (63.5 kg)  12/27/13 140 lb (63.5 kg)      Studies/Labs Reviewed:   EKG:  EKG sinus rhythm, poor R wave progression V1 through V4.  Left axis deviation.  Nonspecific T wave flattening.  Recent Labs: 06/16/2017: BUN 21; Creatinine, Ser 0.81; Hemoglobin 12.3; Platelets 231; Potassium 3.7; Sodium 136   Lipid Panel No results found for: CHOL, TRIG, HDL, CHOLHDL, VLDL, LDLCALC, LDLDIRECT  Additional studies/ records that were reviewed today include:  Reviewed the MRI of the lumbar spine and notes the abdominal aortic aneurysm.    ASSESSMENT:    1. AAA (abdominal aortic aneurysm) without rupture ([place])   2. Aortic valve stenosis, etiology of cardiac valve disease unspecified      PLAN:  In order of problems listed above:  1. The aneurysm is small.  We will get a baseline ultrasound study.  At her age I doubt this will be a significant clinical  problem. 2. Coincidentally found on today's exam was allowed right upper sternal border crescendo decrescendo murmur compatible with aortic stenosis.  She has no angina, dyspnea, and has not had syncope.  An echocardiogram is ordered to assess severity of valve lesion and LV function.  Patient will return in 4-6 weeks.  Results of studies will be discussed.  Plan at her current age is conservative medical care.    Medication Adjustments/Labs and Tests Ordered: Current medicines are reviewed at length with the patient today.  Concerns regarding medicines are  outlined above.  Medication changes, Labs and Tests ordered today are listed in the Patient Instructions below. Patient Instructions  Medication Instructions:  Your physician recommends that you continue on your current medications as directed. Please refer to the Current Medication list given to you today.   Labwork: None  Testing/Procedures: Your physician has requested that you have an echocardiogram. Echocardiography is a painless test that uses sound waves to create images of your heart. It provides your doctor with information about the size and shape of your heart and how well your heart's chambers and valves are working. This procedure takes approximately one hour. There are no restrictions for this procedure.  Your physician has requested that you have an abdominal aorta duplex. During this test, an ultrasound is used to evaluate the aorta. Allow 30 minutes for this exam. Do not eat after midnight the day before and avoid carbonated beverages   Follow-Up: Your physician recommends that you schedule a follow-up appointment in: 6-8 weeks with Dr. Tamala Julian.    Any Other Special Instructions Will Be Listed Below (If Applicable).     If you need a refill on your cardiac medications before your next appointment, please call your pharmacy.  Demetrios Isaacs, MD  08/04/2017 1:16 PM    [facility]  [facility] [address], [place], [place]  [address] Phone: [phone]; Fax: [phone]

## 2017-08-04 NOTE — Patient Instructions (Signed)
Medication Instructions:  Your physician recommends that you continue on your current medications as directed. Please refer to the Current Medication list given to you today.   Labwork: None  Testing/Procedures: Your physician has requested that you have an echocardiogram. Echocardiography is a painless test that uses sound waves to create images of your heart. It provides your doctor with information about the size and shape of your heart and how well your heart's chambers and valves are working. This procedure takes approximately one hour. There are no restrictions for this procedure.  Your physician has requested that you have an abdominal aorta duplex. During this test, an ultrasound is used to evaluate the aorta. Allow 30 minutes for this exam. Do not eat after midnight the day before and avoid carbonated beverages   Follow-Up: Your physician recommends that you schedule a follow-up appointment in: 6-8 weeks with Dr. Tamala Julian.    Any Other Special Instructions Will Be Listed Below (If Applicable).     If you need a refill on your cardiac medications before your next appointment, please call your pharmacy.  Marland Kitchen

## 2017-08-07 DIAGNOSIS — W19XXXD Unspecified fall, subsequent encounter: Secondary | ICD-10-CM | POA: Diagnosis not present

## 2017-08-07 DIAGNOSIS — Z9181 History of falling: Secondary | ICD-10-CM | POA: Diagnosis not present

## 2017-08-07 DIAGNOSIS — S32501D Unspecified fracture of right pubis, subsequent encounter for fracture with routine healing: Secondary | ICD-10-CM | POA: Diagnosis not present

## 2017-08-07 DIAGNOSIS — Z96643 Presence of artificial hip joint, bilateral: Secondary | ICD-10-CM | POA: Diagnosis not present

## 2017-08-09 ENCOUNTER — Ambulatory Visit (HOSPITAL_BASED_OUTPATIENT_CLINIC_OR_DEPARTMENT_OTHER): Payer: Medicare Other

## 2017-08-09 ENCOUNTER — Ambulatory Visit (HOSPITAL_COMMUNITY)
Admission: RE | Admit: 2017-08-09 | Discharge: 2017-08-09 | Disposition: A | Discharge status: Home / Self Care | Payer: Medicare Other | Source: Ambulatory Visit | Attending: Cardiovascular Disease | Admitting: Cardiovascular Disease

## 2017-08-09 ENCOUNTER — Other Ambulatory Visit: Payer: Self-pay

## 2017-08-09 DIAGNOSIS — I082 Rheumatic disorders of both aortic and tricuspid valves: Secondary | ICD-10-CM | POA: Insufficient documentation

## 2017-08-09 DIAGNOSIS — I350 Nonrheumatic aortic (valve) stenosis: Secondary | ICD-10-CM | POA: Insufficient documentation

## 2017-08-09 DIAGNOSIS — I714 Abdominal aortic aneurysm, without rupture, unspecified: Secondary | ICD-10-CM

## 2017-08-10 ENCOUNTER — Telehealth: Payer: Self-pay | Admitting: Interventional Cardiology

## 2017-08-10 DIAGNOSIS — S32501D Unspecified fracture of right pubis, subsequent encounter for fracture with routine healing: Secondary | ICD-10-CM | POA: Diagnosis not present

## 2017-08-10 DIAGNOSIS — W19XXXD Unspecified fall, subsequent encounter: Secondary | ICD-10-CM | POA: Diagnosis not present

## 2017-08-10 DIAGNOSIS — Z96643 Presence of artificial hip joint, bilateral: Secondary | ICD-10-CM | POA: Diagnosis not present

## 2017-08-10 DIAGNOSIS — Z9181 History of falling: Secondary | ICD-10-CM | POA: Diagnosis not present

## 2017-08-10 NOTE — Telephone Encounter (Signed)
Informed pt's son of results, DPR on file.  He verbalized understanding and was in agreement with plan.

## 2017-08-10 NOTE — Telephone Encounter (Signed)
New Message   Patient son Laura Knight calling back from a message yesterday. Please call.

## 2017-08-14 ENCOUNTER — Emergency Department (HOSPITAL_COMMUNITY): Payer: Medicare Other

## 2017-08-14 ENCOUNTER — Other Ambulatory Visit: Payer: Self-pay

## 2017-08-14 ENCOUNTER — Observation Stay (HOSPITAL_COMMUNITY)
Admission: EM | Admit: 2017-08-14 | Discharge: 2017-08-15 | Disposition: A | Discharge status: Nursing Home (Includes ICF) | Payer: Medicare Other | Attending: Internal Medicine | Admitting: Internal Medicine

## 2017-08-14 ENCOUNTER — Encounter (HOSPITAL_COMMUNITY): Payer: Self-pay

## 2017-08-14 DIAGNOSIS — M5136 Other intervertebral disc degeneration, lumbar region: Secondary | ICD-10-CM | POA: Diagnosis not present

## 2017-08-14 DIAGNOSIS — Z96643 Presence of artificial hip joint, bilateral: Secondary | ICD-10-CM | POA: Diagnosis not present

## 2017-08-14 DIAGNOSIS — K5900 Constipation, unspecified: Secondary | ICD-10-CM | POA: Insufficient documentation

## 2017-08-14 DIAGNOSIS — X58XXXS Exposure to other specified factors, sequela: Secondary | ICD-10-CM | POA: Diagnosis not present

## 2017-08-14 DIAGNOSIS — I714 Abdominal aortic aneurysm, without rupture, unspecified: Secondary | ICD-10-CM | POA: Diagnosis present

## 2017-08-14 DIAGNOSIS — X58XXXA Exposure to other specified factors, initial encounter: Secondary | ICD-10-CM | POA: Diagnosis not present

## 2017-08-14 DIAGNOSIS — R0600 Dyspnea, unspecified: Secondary | ICD-10-CM

## 2017-08-14 DIAGNOSIS — I350 Nonrheumatic aortic (valve) stenosis: Secondary | ICD-10-CM | POA: Diagnosis not present

## 2017-08-14 DIAGNOSIS — Z880 Allergy status to penicillin: Secondary | ICD-10-CM | POA: Insufficient documentation

## 2017-08-14 DIAGNOSIS — Z90710 Acquired absence of both cervix and uterus: Secondary | ICD-10-CM | POA: Diagnosis not present

## 2017-08-14 DIAGNOSIS — R0781 Pleurodynia: Secondary | ICD-10-CM | POA: Diagnosis not present

## 2017-08-14 DIAGNOSIS — Z87891 Personal history of nicotine dependence: Secondary | ICD-10-CM | POA: Diagnosis not present

## 2017-08-14 DIAGNOSIS — R05 Cough: Secondary | ICD-10-CM | POA: Diagnosis not present

## 2017-08-14 DIAGNOSIS — R001 Bradycardia, unspecified: Secondary | ICD-10-CM | POA: Insufficient documentation

## 2017-08-14 DIAGNOSIS — M545 Low back pain: Secondary | ICD-10-CM | POA: Diagnosis not present

## 2017-08-14 DIAGNOSIS — R030 Elevated blood-pressure reading, without diagnosis of hypertension: Secondary | ICD-10-CM | POA: Diagnosis not present

## 2017-08-14 DIAGNOSIS — Z79899 Other long term (current) drug therapy: Secondary | ICD-10-CM | POA: Insufficient documentation

## 2017-08-14 DIAGNOSIS — K219 Gastro-esophageal reflux disease without esophagitis: Secondary | ICD-10-CM | POA: Insufficient documentation

## 2017-08-14 DIAGNOSIS — R0602 Shortness of breath: Secondary | ICD-10-CM | POA: Insufficient documentation

## 2017-08-14 DIAGNOSIS — S32592A Other specified fracture of left pubis, initial encounter for closed fracture: Secondary | ICD-10-CM | POA: Insufficient documentation

## 2017-08-14 DIAGNOSIS — R509 Fever, unspecified: Secondary | ICD-10-CM | POA: Insufficient documentation

## 2017-08-14 DIAGNOSIS — I441 Atrioventricular block, second degree: Principal | ICD-10-CM | POA: Insufficient documentation

## 2017-08-14 DIAGNOSIS — R079 Chest pain, unspecified: Secondary | ICD-10-CM | POA: Diagnosis not present

## 2017-08-14 DIAGNOSIS — M8448XA Pathological fracture, other site, initial encounter for fracture: Secondary | ICD-10-CM | POA: Insufficient documentation

## 2017-08-14 DIAGNOSIS — Z882 Allergy status to sulfonamides status: Secondary | ICD-10-CM | POA: Insufficient documentation

## 2017-08-14 LAB — CBC WITH DIFFERENTIAL/PLATELET
BASOS ABS: 0.1 10*3/uL (ref 0.0–0.1)
Basophils Relative: 1 %
Eosinophils Absolute: 0.1 10*3/uL (ref 0.0–0.7)
Eosinophils Relative: 2 %
HEMATOCRIT: 34 % — AB (ref 36.0–46.0)
Hemoglobin: 10.9 g/dL — ABNORMAL LOW (ref 12.0–15.0)
LYMPHS PCT: 29 %
Lymphs Abs: 2.2 10*3/uL (ref 0.7–4.0)
MCH: 30.2 pg (ref 26.0–34.0)
MCHC: 32.1 g/dL (ref 30.0–36.0)
MCV: 94.2 fL (ref 78.0–100.0)
MONO ABS: 0.7 10*3/uL (ref 0.1–1.0)
Monocytes Relative: 9 %
NEUTROS ABS: 4.6 10*3/uL (ref 1.7–7.7)
NEUTROS PCT: 59 %
Platelets: 291 10*3/uL (ref 150–400)
RBC: 3.61 MIL/uL — AB (ref 3.87–5.11)
RDW: 14.7 % (ref 11.5–15.5)
WBC: 7.8 10*3/uL (ref 4.0–10.5)

## 2017-08-14 LAB — BRAIN NATRIURETIC PEPTIDE: B Natriuretic Peptide: 316.7 pg/mL — ABNORMAL HIGH (ref 0.0–100.0)

## 2017-08-14 LAB — COMPREHENSIVE METABOLIC PANEL
ALK PHOS: 154 U/L — AB (ref 38–126)
ALT: 13 U/L — AB (ref 14–54)
AST: 18 U/L (ref 15–41)
Albumin: 3.3 g/dL — ABNORMAL LOW (ref 3.5–5.0)
Anion gap: 8 (ref 5–15)
BILIRUBIN TOTAL: 0.7 mg/dL (ref 0.3–1.2)
BUN: 13 mg/dL (ref 6–20)
CO2: 24 mmol/L (ref 22–32)
CREATININE: 0.85 mg/dL (ref 0.44–1.00)
Calcium: 8.3 mg/dL — ABNORMAL LOW (ref 8.9–10.3)
Chloride: 108 mmol/L (ref 101–111)
GFR calc Af Amer: >60 mL/min (ref >60)
GFR, EST NON AFRICAN AMERICAN: 59 mL/min — AB (ref >60)
GLUCOSE: 89 mg/dL (ref 65–99)
Potassium: 4 mmol/L (ref 3.5–5.1)
Sodium: 140 mmol/L (ref 135–145)
TOTAL PROTEIN: 6.4 g/dL — AB (ref 6.5–8.1)

## 2017-08-14 LAB — TROPONIN I

## 2017-08-14 MED ORDER — ACETAMINOPHEN 500 MG PO TABS
1000.0000 mg | ORAL_TABLET | Freq: Four times a day (QID) | ORAL | Status: DC | PRN
  Administered 2017-08-14: 1000 mg via ORAL
  Filled 2017-08-14: qty 2

## 2017-08-14 MED ORDER — ENOXAPARIN SODIUM 40 MG/0.4ML ~~LOC~~ SOLN
40.0000 mg | SUBCUTANEOUS | Status: DC
  Administered 2017-08-14: 40 mg via SUBCUTANEOUS
  Filled 2017-08-14: qty 0.4

## 2017-08-14 MED ORDER — ADULT MULTIVITAMIN W/MINERALS CH
ORAL_TABLET | Freq: Every day | ORAL | Status: DC
  Administered 2017-08-14 – 2017-08-15 (×2): 1 via ORAL
  Filled 2017-08-14 (×2): qty 1

## 2017-08-14 MED ORDER — BENZONATATE 100 MG PO CAPS
100.0000 mg | ORAL_CAPSULE | Freq: Two times a day (BID) | ORAL | Status: DC
  Administered 2017-08-14 – 2017-08-15 (×2): 100 mg via ORAL
  Filled 2017-08-14 (×3): qty 1

## 2017-08-14 MED ORDER — DOCUSATE SODIUM 100 MG PO CAPS: 100.0000 mg | ORAL_CAPSULE | Freq: Every day | ORAL | Status: DC | PRN

## 2017-08-14 MED ORDER — INFLUENZA VAC SPLIT HIGH-DOSE 0.5 ML IM SUSY
0.5000 mL | PREFILLED_SYRINGE | INTRAMUSCULAR | Status: DC
  Filled 2017-08-14: qty 0.5

## 2017-08-14 MED ORDER — GUAIFENESIN 100 MG/5ML PO SOLN
5.0000 mL | Freq: Two times a day (BID) | ORAL | Status: DC
  Administered 2017-08-14: 100 mg via ORAL
  Filled 2017-08-14 (×3): qty 5

## 2017-08-14 MED ORDER — FUROSEMIDE 10 MG/ML IJ SOLN
40.0000 mg | Freq: Once | INTRAMUSCULAR | Status: AC
  Administered 2017-08-14: 40 mg via INTRAVENOUS
  Filled 2017-08-14: qty 4

## 2017-08-14 MED ORDER — FLUTICASONE PROPIONATE 50 MCG/ACT NA SUSP
1.0000 | Freq: Every day | NASAL | Status: DC | PRN
  Filled 2017-08-14: qty 16

## 2017-08-14 MED ORDER — IOPAMIDOL (ISOVUE-370) INJECTION 76%
INTRAVENOUS | Status: AC
  Administered 2017-08-14: 100 mL
  Filled 2017-08-14: qty 100

## 2017-08-14 MED ORDER — PREDNISONE 10 MG PO TABS
10.0000 mg | ORAL_TABLET | Freq: Every day | ORAL | Status: DC
  Administered 2017-08-14 – 2017-08-15 (×2): 10 mg via ORAL
  Filled 2017-08-14 (×2): qty 1

## 2017-08-14 NOTE — ED Notes (Signed)
Patient assisted to BR and back to room via wheelchair.  Patient urinated while in bathroom.

## 2017-08-14 NOTE — ED Notes (Signed)
Patient returned to room from CT. 

## 2017-08-14 NOTE — ED Notes (Signed)
Dr. Yelverton at bedside at this time.  

## 2017-08-14 NOTE — ED Triage Notes (Signed)
PER EMS: pt from home, called out for possible pna due to c/o non-productive cough and pain to right back and side that radiates to right breast. She also reports SOB. HR 38-84, irregular ith new onset afib. Denies chest pain. LBBB. 96% roomair, BP 156/78.

## 2017-08-14 NOTE — Progress Notes (Signed)
Laura Knight admitted to room 3E10 from ED.  Patient oriented to room and items within reach.  Patient stated understanding.

## 2017-08-14 NOTE — ED Provider Notes (Signed)
[facility] EMERGENCY DEPARTMENT Provider Note   CSN: [id number] Arrival date & time: 08/14/17  2694     History   Chief Complaint Chief Complaint  Patient presents with  . Cough    HPI Laura Knight is a 83 y.o. female.  HPI Patient presents with 3 days of nonproductive cough, subjective fevers and chills and right sided thoracic back pain worsening throughout the night.  States the pain radiates to her right chest.  No nausea or vomiting.  She has associated shortness of breath but denies any lower extremity swelling or pain. Past Medical History:  Diagnosis Date  . Acute bilateral low back pain without sciatica   . Aftercare following surgery of the musculoskeletal system   . Arthritis   . Contusion of left hip    initial encounter  . GERD (gastroesophageal reflux disease)   . History of blood transfusion yrs ago  . Left hip pain   . Osteoarthritis of left hip    unspecified osteoarthritis type  . 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 Status post bilateral hip replacements   . Trochanteric bursitis, right hip     Patient Active Problem List   Diagnosis Date Noted  . Bradycardia with 51-60 beats per minute 08/14/2017  . AAA (abdominal aortic aneurysm) without rupture ([place]) 08/04/2017  . Aortic valve stenosis 08/04/2017  . Esophageal reflux 07/09/2013  . Constipation 06/18/2013  . Osteoarthritis of left hip S/P Left Total Hip Arthroplasty 06/18/2013  . Expected blood loss anemia 06/05/2013  . S/P left THA, AA 06/04/2013    Past Surgical History:  Procedure Laterality Date  . ABDOMINAL HYSTERECTOMY     1 ovary removed  . bilateral knee replacment  2008  . JOINT REPLACEMENT    . right hip replacement  1977  . TOTAL HIP ARTHROPLASTY Left 06/04/2013   Procedure: LEFT TOTAL HIP ARTHROPLASTY ANTERIOR APPROACH;  Surgeon: Mauri Pole, MD;  Location: [facility];  Service: Orthopedics;  Laterality: Left;    OB History     No data available       Home Medications    Prior to Admission medications   Medication Sig Start Date End Date Taking? Authorizing Provider  acetaminophen (TYLENOL) 500 MG tablet Take 1,000 mg by mouth every 6 (six) hours as needed for mild pain or headache.   Yes [provider]  cetirizine (ZYRTEC) 10 MG tablet Take 10 mg by mouth daily.   Yes [provider]  Cholecalciferol (VITAMIN D PO) Take 1 tablet by mouth daily. 500 - 1000 MG ONCE DAILY   Yes [provider]  docusate sodium (COLACE) 100 MG capsule Take 100 mg by mouth daily as needed for mild constipation.   Yes [provider]  fluticasone (FLONASE) 50 MCG/ACT nasal spray Place 1 spray into both nostrils daily as needed for allergies or rhinitis.  03/22/17  Yes [provider]  Multiple Vitamin (MULTI-DAY VITAMINS PO) Take 1 tablet by mouth daily.   Yes [provider]  predniSONE (DELTASONE) 10 MG tablet Take 10 mg by mouth daily. 06/08/17  Yes [provider]  HYDROcodone-acetaminophen (NORCO/VICODIN) 5-325 MG tablet Take 1 tablet by mouth every 4 (four) hours as needed for moderate pain or severe pain. Patient not taking: Reported on 08/14/2017 06/16/17   Domenic Moras, PA-C  ibuprofen (ADVIL,MOTRIN) 600 MG tablet Take 1 tablet (600 mg total) by mouth every 6 (six) hours as needed for mild pain or moderate  pain. Patient not taking: Reported on 08/14/2017 06/16/17   Domenic Moras, PA-C    Family History No family history on file.  Social History Social History   Tobacco Use  . Smoking status: Former Smoker    Packs/day: 0.50    Years: 12.00    Pack years: 6.00    Types: Cigarettes  . Smokeless tobacco: Never Used  . Tobacco comment: quit smoking age 73  Substance Use Topics  . Alcohol use: Yes    Comment: occasional wine  . Drug use: No     Allergies   Amoxicillin; Sulfa antibiotics; and Sulfamethoxazole   Review of Systems Review of Systems    Constitutional: Positive for chills, fatigue and fever.  Respiratory: Positive for cough and shortness of breath.   Cardiovascular: Positive for chest pain. Negative for palpitations and leg swelling.  Gastrointestinal: Negative for abdominal pain, constipation, diarrhea, nausea and vomiting.  Musculoskeletal: Positive for back pain. Negative for neck pain and neck stiffness.  Skin: Negative for rash and wound.  Neurological: Negative for dizziness, weakness, light-headedness, numbness and headaches.  All other systems reviewed and are negative.    Physical Exam Updated Vital Signs BP (!) 152/73   Pulse 78   Temp 98.7 F (37.1 C) (Oral)   Resp 19   SpO2 95%   Physical Exam  Constitutional: She is oriented to person, place, and time. She appears well-developed and well-nourished.  HENT:  Head: Normocephalic and atraumatic.  Mouth/Throat: Oropharynx is clear and moist.  Eyes: EOM are normal. Pupils are equal, round, and reactive to light.  Neck: Normal range of motion. Neck supple. No JVD present.  Cardiovascular: Regular rhythm.  Murmur heard. Bradycardia  Pulmonary/Chest: Effort normal and breath sounds normal. No stridor. No respiratory distress. She has no wheezes. She has no rales. She exhibits no tenderness.  Abdominal: Soft. Bowel sounds are normal. There is no tenderness. There is no rebound and no guarding.  Musculoskeletal: Normal range of motion. She exhibits tenderness. She exhibits no edema.  Patient has right sided posterior thoracic back tenderness over the inferior ribs.  No lower extremity swelling, asymmetry or tenderness.  No midline thoracic or lumbar tenderness.  Distal pulses are 2+.  Neurological: She is alert and oriented to person, place, and time.  Moves all extremities without deficit.  Sensation intact.  Patient is hard of hearing.  Skin: Skin is warm and dry. Capillary refill takes less than 2 seconds. No rash noted. No erythema.  Psychiatric: She has  a normal mood and affect. Her behavior is normal.  Nursing note and vitals reviewed.    ED Treatments / Results  Labs (all labs ordered are listed, but only abnormal results are displayed) Labs Reviewed  CBC WITH DIFFERENTIAL/PLATELET - Abnormal; Notable for the following components:      Result Value   RBC 3.61 (*)    Hemoglobin 10.9 (*)    HCT 34.0 (*)    All other components within normal limits  COMPREHENSIVE METABOLIC PANEL - Abnormal; Notable for the following components:   Calcium 8.3 (*)    Total Protein 6.4 (*)    Albumin 3.3 (*)    ALT 13 (*)    Alkaline Phosphatase 154 (*)    GFR calc non Af Amer 59 (*)    All other components within normal limits  BRAIN NATRIURETIC PEPTIDE - Abnormal; Notable for the following components:   B Natriuretic Peptide 316.7 (*)    All other components within normal limits  TROPONIN I    EKG  EKG Interpretation  Date/Time:  Monday August 14 2017 07:33:58 EST Ventricular Rate:  59 PR Interval:  216 QRS Duration: 134 QT Interval:  452 QTC Calculation: 447 R Axis:   -51 Text Interpretation:  AV Block Sinus rhythm with 2nd degree A-V block (Mobitz II) with 2:1 A-V conduction Left axis deviation Left bundle branch block Abnormal ECG Confirmed by Julianne Rice [phone]) on 08/14/2017 8:03:16 AM       Radiology Dg Chest Port 1 View  Result Date: 08/14/2017 CLINICAL DATA:  Cough and shortness of breath for 3 days, history GERD, former smoker EXAM: PORTABLE CHEST 1 VIEW COMPARISON:  Portable exam 0823 hours compared to 10/04/2016 FINDINGS: Enlargement of cardiac silhouette. Mediastinal contours and pulmonary vascularity normal. Atherosclerotic calcification of thoracic aorta. Scarring at lingula. Chronic bronchitic changes without infiltrate, pleural effusion or pneumothorax. Bones diffusely demineralized. IMPRESSION: Enlargement of cardiac silhouette. Chronic bronchitic changes with lingular scarring. No acute abnormalities.  Electronically Signed   By: Lavonia Dana M.D.   On: 08/14/2017 08:47   Ct Angio Chest/abd/pel For Dissection W And/or W/wo  Result Date: 08/14/2017 CLINICAL DATA:  83 year old with shortness of breath. Chest and back pain for 3 days. EXAM: CT ANGIOGRAPHY CHEST, ABDOMEN AND PELVIS TECHNIQUE: Multidetector CT imaging through the chest, abdomen and pelvis was performed using the standard protocol during bolus administration of intravenous contrast. Multiplanar reconstructed images and MIPs were obtained and reviewed to evaluate the vascular anatomy. CONTRAST:  150mL ISOVUE-370 IOPAMIDOL (ISOVUE-370) INJECTION 76% COMPARISON:  Chest radiograph 08/14/2017 FINDINGS: CTA CHEST FINDINGS Cardiovascular: Fusiform aneurysm of the ascending thoracic aorta measuring up to 4.5 cm. Aortic root at the sinuses of Valsalva measure roughly 3.9 cm. The sinotubular junction measures roughly 3.4 cm. Negative for an aortic dissection. There are coronary artery calcifications. Great vessels are patent. Aortic arch measures roughly 3.1 cm. Proximal descending thoracic aorta measures 3.0 cm. Aorta at the hiatus measures 3.3 cm. Atherosclerotic calcifications and disease in the thoracic aorta but no evidence for intramural hematoma. Main pulmonary arteries are patent. Heart size is mildly enlarged. No significant pericardial fluid. Mediastinum/Nodes: No significant mediastinal, hilar or axillary lymphadenopathy. Lungs/Pleura: Trachea and mainstem bronchi are patent. Scarring at the lung apices. No large pleural effusions. No significant airspace disease or consolidation in the lungs. Musculoskeletal: No acute bone abnormality. Review of the MIP images confirms the above findings. CTA ABDOMEN AND PELVIS FINDINGS VASCULAR Aorta: Mild enlargement of the proximal abdominal aorta measuring up to 3.2 cm between celiac trunk and SMA. Negative for an aortic dissection. Celiac: Critical stenosis at the origin of the celiac artery. This stenosis  may be secondary to median arcuate ligament compression. SMA: SMA is widely patent. Renals: Both renal arteries are patent without evidence of aneurysm, dissection, vasculitis, fibromuscular dysplasia or significant stenosis. IMA: IMA is patent. Inflow: Atherosclerotic disease in the iliac arteries without significant stenosis. Proximal femoral arteries are patent bilaterally. Veins: No obvious venous abnormality within the limitations of this arterial phase study. Review of the MIP images confirms the above findings. NON-VASCULAR Hepatobiliary: No acute abnormality to the liver or gallbladder. Pancreas: Normal appearance of the pancreas without inflammation or duct dilatation. Spleen: Normal appearance of spleen without enlargement. Adrenals/Urinary Tract: Fullness in the left adrenal gland which has Hounsfield units of less than 0 on the precontrast images. This is most compatible with a left adrenal adenoma. Mild fullness in the right adrenal gland. Urinary bladder is unremarkable. Negative for hydronephrosis. No suspicious renal lesions.  Stomach/Bowel: 2.1 x 1.6 cm nodular lesion just anterior to the stomach antrum on sequence 14, image 134. Not clear if this represents a peritoneal lesion or part of the stomach or adjacent liver. This lesion does enhance. Otherwise, normal appearance of the stomach. No evidence for bowel dilatation or obstruction. Lymphatic: No lymph node enlargement. Reproductive: Status post hysterectomy. No adnexal masses. Other: Small amount of free fluid in the pelvis. Negative for free air. Musculoskeletal: Bilateral hip replacements are located. Lucent material causing cortical destruction along the superior anterior right acetabulum. 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 Similar findings were present on the pelvic radiograph from 06/04/2013 but the disease has progressed. Old bilateral pubic rami fractures. Extensive sclerosis throughout the sacrum compatible with  sacral insufficiency fractures. Multilevel degenerative disc disease in lumbar spine. Review of the MIP images confirms the above findings. IMPRESSION: Negative for an aortic dissection. Aneurysm of the ascending thoracic aortic measuring up to 4.5 cm. Ascending thoracic aortic aneurysm. Recommend semi-annual imaging followup by CTA or MRA and referral to cardiothoracic surgery if not already obtained. This recommendation follows 2010 ACCF/AHA/AATS/ACR/ASA/SCA/SCAI/SIR/STS/SVM Guidelines for the Diagnosis and Management of Patients With Thoracic Aortic Disease. Circulation. 2010; 121: J825-K539 Abdominal aortic aneurysm measuring 3.2 cm. 2.1 cm enhancing lesion along the anterior aspect of the gastric body. This could represent an exophytic lesion such as a GIST tumor or possibly a peritoneal lesion. Findings are nonspecific and consider short-term surveillance with CT or further characterized with a PET-CT. Small amount of free fluid in the pelvis which is nonspecific. Critical stenosis at origin of celiac trunk which may be related to median arcuate ligament compression rather than atherosclerotic disease. 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 This appears represent a chronic process. Sacral insufficiency fractures, probably chronic. Electronically Signed   By: Markus Daft M.D.   On: 08/14/2017 13:01    Procedures Procedures (including critical care time)  Medications Ordered in ED Medications  acetaminophen (TYLENOL) tablet 1,000 mg (not administered)  docusate sodium (COLACE) capsule 100 mg (not administered)  fluticasone (FLONASE) 50 MCG/ACT nasal spray 1 spray (not administered)  multivitamin with minerals tablet (not administered)  predniSONE (DELTASONE) tablet 10 mg (not administered)  iopamidol (ISOVUE-370) 76 % injection (100 mLs  Contrast Given 08/14/17 1145)  furosemide (LASIX) injection 40 mg (40 mg Intravenous Given  08/14/17 1425)     Initial Impression / Assessment and Plan / ED Course  I have reviewed the triage vital signs and the nursing notes.  Pertinent labs & imaging results that were available during my care of the patient were reviewed by me and considered in my medical decision making (see chart for details).    Discussed with both Dr. Tamala Julian and Dr. Marlou Porch.  Agree with telemetry observation.  Patient does have elevation in BNP.  CT Angie of chest with thoracic aneurysm without evidence of dissection.  Question some amount of heart failure.  Will give dose of IV Lasix.  Discussed with hospitalist who will evaluate patient in the emergency department and admit.   Final Clinical Impressions(s) / ED Diagnoses   Final diagnoses:  Dyspnea, unspecified type  Second degree AV block, Mobitz type II    ED Discharge Orders    None       Julianne Rice, MD 08/14/17 1517

## 2017-08-14 NOTE — ED Notes (Signed)
Assisted patient to restroom via wheelchair 

## 2017-08-14 NOTE — H&P (Signed)
History and Physical  Laura Knight YTK:[id number] DOB: 04/01/1925 DOA: 08/14/2017  Referring physician: Dr. Lita Mains PCP: Leeroy Cha, MD  Outpatient Specialists: Dr. Daneen Schick Patient coming from:SNF At her baseline ambulates  Chief Complaint: cough  HPI: Laura Knight is a 83 y.o. female with medical history significant for abdominal aortic aneurysm and mild aortic stenosis who presents on 08/14/2017 with 3 days of cough and was found to have asymptomatic Mobitz type II heart block.  History obtained from patient and son at bedside.  Over the past 3 days patient has had a nonproductive cough that has made it difficult for her to sleep.  Denies any associated chest pain, recent sick contacts, nausea or vomiting.  Endorses shortness of breath and subjective fevers and chills.  Patient states the cough is worse when she lies down.  No association with eating food.  No hemoptysis.  No dysphagia. States given her lack of sleep consented to monitor the ED.   ED Course: Afebrile hemodynamically stable, heart rate with a nadir of 38, blood pressure peak of 169/57.  Lab workup notable for CMP unremarkable, BNP 316, troponin negative.  CT angios chest abdomen and pelvis negative for aortic dissection.  Chest x-ray showed enlargement of cardiac silhouette.  Patient was given IV Lasix 40 mg x1 and [facility].  Review of Systems:As mentioned in the history of present illness.Review of systems are otherwise negative Patient seen .    Past Medical History:  Diagnosis Date  . Acute bilateral low back pain without sciatica   . Aftercare following surgery of the musculoskeletal system   . Arthritis   . Contusion of left hip    initial encounter  . GERD (gastroesophageal reflux disease)   . History of blood transfusion yrs ago  . Left hip pain   . Osteoarthritis of left hip    unspecified osteoarthritis type  . 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 Status post bilateral hip replacements   . Trochanteric bursitis, right hip    Past Surgical History:  Procedure Laterality Date  . ABDOMINAL HYSTERECTOMY     1 ovary removed  . bilateral knee replacment  2008  . JOINT REPLACEMENT    . right hip replacement  1977  . TOTAL HIP ARTHROPLASTY Left 06/04/2013   Procedure: LEFT TOTAL HIP ARTHROPLASTY ANTERIOR APPROACH;  Surgeon: Mauri Pole, MD;  Location: [facility];  Service: Orthopedics;  Laterality: Left;    Social History:  reports that she has quit smoking. Her smoking use included cigarettes. She has a 6.00 pack-year smoking history. she has never used smokeless tobacco. She reports that she drinks alcohol. She reports that she does not use drugs.   Allergies  Allergen Reactions  . Amoxicillin Other (See Comments)    Unknown reaction Has patient had a PCN reaction causing immediate rash, facial/tongue/throat swelling, SOB or lightheadedness with hypotension: No Has patient had a PCN reaction causing severe rash involving mucus membranes or skin necrosis: Unknown Has patient had a PCN reaction that required hospitalization: Unknown Has patient had a PCN reaction occurring within the last 10 years: Unknown If all of the above answers are "NO", then may proceed with Cephalosporin use.   . Sulfa Antibiotics Other (See Comments)    Unknown reaction  . Sulfamethoxazole Rash and Hives    Family History  Problem Relation Age of Onset  . Aortic stenosis Son       Prior to Admission medications  Medication Sig Start Date End Date Taking? Authorizing Provider  acetaminophen (TYLENOL) 500 MG tablet Take 1,000 mg by mouth every 6 (six) hours as needed for mild pain or headache.   Yes [provider]  cetirizine (ZYRTEC) 10 MG tablet Take 10 mg by mouth daily.   Yes [provider]  Cholecalciferol (VITAMIN D PO) Take 1 tablet by mouth daily. 500 - 1000 MG ONCE DAILY   Yes [provider]  docusate sodium (COLACE) 100 MG capsule Take 100 mg by mouth daily as needed for mild constipation.   Yes [provider]  fluticasone (FLONASE) 50 MCG/ACT nasal spray Place 1 spray into both nostrils daily as needed for allergies or rhinitis.  03/22/17  Yes [provider]  Multiple Vitamin (MULTI-DAY VITAMINS PO) Take 1 tablet by mouth daily.   Yes [provider]  predniSONE (DELTASONE) 10 MG tablet Take 10 mg by mouth daily. 06/08/17  Yes [provider]  HYDROcodone-acetaminophen (NORCO/VICODIN) 5-325 MG tablet Take 1 tablet by mouth every 4 (four) hours as needed for moderate pain or severe pain. Patient not taking: Reported on 08/14/2017 06/16/17   Domenic Moras, PA-C  ibuprofen (ADVIL,MOTRIN) 600 MG tablet Take 1 tablet (600 mg total) by mouth every 6 (six) hours as needed for mild pain or moderate pain. Patient not taking: Reported on 08/14/2017 06/16/17   Domenic Moras, PA-C    Physical Exam: BP (!) 132/59 (BP Location: Right Arm)   Pulse 94   Temp 98.5 F (36.9 C) (Oral)   Resp 18   Ht 5\' 4"  (1.626 m)   Wt 56.3 kg (124 lb 3 oz)   SpO2 99%   BMI 21.32 kg/m    General: Lying in bed in Appear in no distress Eyes: EOMI, anicteric ENT: Oral Mucosa clear moist. Neck: normal, supple, no masses, no appreciable JVD Cardiovascular: bradycardic, systolic murmur loudest in RUSB, No edema Respiratory: Normal respiratory effort, clear breath sounds on auscultation bilaterally, no rales wheezes or rhonchi Abdomen: soft, non-distended, non-tender, normal bowel sounds, no guarding or rebound tenderness Skin: No Rash Musculoskeletal: No clubbing / cyanosis. No joint deformity upper and lower extremities. Good ROM, no contractures. Normal muscle tone Neurologic: Grossly no focal neuro deficit.Mental status AAOx3, speech normal, Psychiatric:Appropriate affect, and mood          Labs on Admission:  Basic Metabolic Panel: Recent Labs  Lab 08/14/17 0908    NA 140  K 4.0  CL 108  CO2 24  GLUCOSE 89  BUN 13  CREATININE 0.85  CALCIUM 8.3*   Liver Function Tests: Recent Labs  Lab 08/14/17 0908  AST 18  ALT 13*  ALKPHOS 154*  BILITOT 0.7  PROT 6.4*  ALBUMIN 3.3*   No results for input(s): LIPASE, AMYLASE in the last 168 hours. No results for input(s): AMMONIA in the last 168 hours. CBC: Recent Labs  Lab 08/14/17 0908  WBC 7.8  NEUTROABS 4.6  HGB 10.9*  HCT 34.0*  MCV 94.2  PLT 291   Cardiac Enzymes: Recent Labs  Lab 08/14/17 0908  TROPONINI <0.03    BNP (last 3 results) Recent Labs    08/14/17 0800  BNP 316.7*    ProBNP (last 3 results) No results for input(s): PROBNP in the last 8760 hours.  CBG: No results for input(s): GLUCAP in the last 168 hours.  Radiological Exams on Admission: Dg Chest Port 1 View  Result Date: 08/14/2017 CLINICAL DATA:  Cough and shortness of breath for 3  days, history GERD, former smoker EXAM: PORTABLE CHEST 1 VIEW COMPARISON:  Portable exam 0823 hours compared to 10/04/2016 FINDINGS: Enlargement of cardiac silhouette. Mediastinal contours and pulmonary vascularity normal. Atherosclerotic calcification of thoracic aorta. Scarring at lingula. Chronic bronchitic changes without infiltrate, pleural effusion or pneumothorax. Bones diffusely demineralized. IMPRESSION: Enlargement of cardiac silhouette. Chronic bronchitic changes with lingular scarring. No acute abnormalities. Electronically Signed   By: Lavonia Dana M.D.   On: 08/14/2017 08:47   Ct Angio Chest/abd/pel For Dissection W And/or W/wo  Result Date: 08/14/2017 CLINICAL DATA:  83 year old with shortness of breath. Chest and back pain for 3 days. EXAM: CT ANGIOGRAPHY CHEST, ABDOMEN AND PELVIS TECHNIQUE: Multidetector CT imaging through the chest, abdomen and pelvis was performed using the standard protocol during bolus administration of intravenous contrast. Multiplanar reconstructed images and MIPs were obtained and reviewed to  evaluate the vascular anatomy. CONTRAST:  159mL ISOVUE-370 IOPAMIDOL (ISOVUE-370) INJECTION 76% COMPARISON:  Chest radiograph 08/14/2017 FINDINGS: CTA CHEST FINDINGS Cardiovascular: Fusiform aneurysm of the ascending thoracic aorta measuring up to 4.5 cm. Aortic root at the sinuses of Valsalva measure roughly 3.9 cm. The sinotubular junction measures roughly 3.4 cm. Negative for an aortic dissection. There are coronary artery calcifications. Great vessels are patent. Aortic arch measures roughly 3.1 cm. Proximal descending thoracic aorta measures 3.0 cm. Aorta at the hiatus measures 3.3 cm. Atherosclerotic calcifications and disease in the thoracic aorta but no evidence for intramural hematoma. Main pulmonary arteries are patent. Heart size is mildly enlarged. No significant pericardial fluid. Mediastinum/Nodes: No significant mediastinal, hilar or axillary lymphadenopathy. Lungs/Pleura: Trachea and mainstem bronchi are patent. Scarring at the lung apices. No large pleural effusions. No significant airspace disease or consolidation in the lungs. Musculoskeletal: No acute bone abnormality. Review of the MIP images confirms the above findings. CTA ABDOMEN AND PELVIS FINDINGS VASCULAR Aorta: Mild enlargement of the proximal abdominal aorta measuring up to 3.2 cm between celiac trunk and SMA. Negative for an aortic dissection. Celiac: Critical stenosis at the origin of the celiac artery. This stenosis may be secondary to median arcuate ligament compression. SMA: SMA is widely patent. Renals: Both renal arteries are patent without evidence of aneurysm, dissection, vasculitis, fibromuscular dysplasia or significant stenosis. IMA: IMA is patent. Inflow: Atherosclerotic disease in the iliac arteries without significant stenosis. Proximal femoral arteries are patent bilaterally. Veins: No obvious venous abnormality within the limitations of this arterial phase study. Review of the MIP images confirms the above findings.  NON-VASCULAR Hepatobiliary: No acute abnormality to the liver or gallbladder. Pancreas: Normal appearance of the pancreas without inflammation or duct dilatation. Spleen: Normal appearance of spleen without enlargement. Adrenals/Urinary Tract: Fullness in the left adrenal gland which has Hounsfield units of less than 0 on the precontrast images. This is most compatible with a left adrenal adenoma. Mild fullness in the right adrenal gland. Urinary bladder is unremarkable. Negative for hydronephrosis. No suspicious renal lesions. Stomach/Bowel: 2.1 x 1.6 cm nodular lesion just anterior to the stomach antrum on sequence 14, image 134. Not clear if this represents a peritoneal lesion or part of the stomach or adjacent liver. This lesion does enhance. Otherwise, normal appearance of the stomach. No evidence for bowel dilatation or obstruction. Lymphatic: No lymph node enlargement. Reproductive: Status post hysterectomy. No adnexal masses. Other: Small amount of free fluid in the pelvis. Negative for free air. Musculoskeletal: Bilateral hip replacements are located. Lucent material causing cortical destruction along the superior anterior right acetabulum. 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 Similar findings  were present on the pelvic radiograph from 06/04/2013 but the disease has progressed. Old bilateral pubic rami fractures. Extensive sclerosis throughout the sacrum compatible with sacral insufficiency fractures. Multilevel degenerative disc disease in lumbar spine. Review of the MIP images confirms the above findings. IMPRESSION: Negative for an aortic dissection. Aneurysm of the ascending thoracic aortic measuring up to 4.5 cm. Ascending thoracic aortic aneurysm. Recommend semi-annual imaging followup by CTA or MRA and referral to cardiothoracic surgery if not already obtained. This recommendation follows 2010 ACCF/AHA/AATS/ACR/ASA/SCA/SCAI/SIR/STS/SVM Guidelines for the Diagnosis and  Management of Patients With Thoracic Aortic Disease. Circulation. 2010; 121: E720-N470 Abdominal aortic aneurysm measuring 3.2 cm. 2.1 cm enhancing lesion along the anterior aspect of the gastric body. This could represent an exophytic lesion such as a GIST tumor or possibly a peritoneal lesion. Findings are nonspecific and consider short-term surveillance with CT or further characterized with a PET-CT. Small amount of free fluid in the pelvis which is nonspecific. Critical stenosis at origin of celiac trunk which may be related to median arcuate ligament compression rather than atherosclerotic disease. 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 This appears represent a chronic process. Sacral insufficiency fractures, probably chronic. Electronically Signed   By: Markus Daft M.D.   On: 08/14/2017 13:01    EKG: Independently reviewed. Mobitz type 2 block  Assessment/Plan Present on Admission: . Mobitz type 2 second degree AV block . AAA (abdominal aortic aneurysm) without rupture (HCC)  Active Problems:   AAA (abdominal aortic aneurysm) without rupture (HCC)   Aortic valve stenosis   Mobitz type 2 second degree AV block   Mobitz type II second-degree AV block, asymptomatic.  Discussed with patient we does not want pacemaker, transcutaneous pacing.  Patient is aware risk of death with this arrhythmia.  Patient currently asymptomatic. -Continue to monitor on telemetry  Cough and shortness of breath.  Not requiring any supplemental oxygen.  Chest x-ray not overly impressive for fluid overload.  BNP is elevated and TTE does show diastolic dysfunction in the setting of known aortic stenosis patient is more susceptible to changes in fluid status -Status post IV Lasix 40 mg x1 with adequate output -Continue to monitor fluid output, already seeing improvement shortness of breath -Continue supportive measures Robitussin, Tessalon Perles,  incentive spirometry  Aortic valve stenosis, mild, asymptomatic Query potential for possible CHF exacerbation (diastolic dysfunction) same shortness of breath is more associated with cough however cannot for sure rule out is not related to valvular dysfunction -Continue plan above with judicious use of diuretics as patient is nave to Lasix  Abdominal aortic aneurysm, stable CT imaging shows no expansion of aneurysm and negative for dissection  DVT prophylaxis: Lovenox  Code Status: DNR  Family Communication: Family was present at bedside and updated appropriately at the time of interview.   Disposition Plan: Monitor on telemetry overnight, likely discharge in a.m. if heart rate improves.  Consults called: None  Admission status: Admitted as observationto telemetry unit.      Desiree Hane MD [facility]  Pager [phone]  If 7PM-7AM, please contact night-coverage [email or web address] Password [facility]  08/14/2017, 8:42 PM

## 2017-08-14 NOTE — ED Notes (Signed)
Patient currently in CT °

## 2017-08-14 NOTE — Plan of Care (Signed)
  Completed/Met Education: Knowledge of General Education information will improve 08/14/2017 1603 - Completed/Met by Alonna Buckler, RN Pain Managment: General experience of comfort will improve 08/14/2017 1603 - Completed/Met by Alonna Buckler, RN Skin Integrity: Risk for impaired skin integrity will decrease 08/14/2017 1603 - Completed/Met by Alonna Buckler, RN

## 2017-08-15 DIAGNOSIS — I350 Nonrheumatic aortic (valve) stenosis: Secondary | ICD-10-CM | POA: Diagnosis not present

## 2017-08-15 DIAGNOSIS — I441 Atrioventricular block, second degree: Secondary | ICD-10-CM | POA: Diagnosis not present

## 2017-08-15 DIAGNOSIS — I714 Abdominal aortic aneurysm, without rupture: Secondary | ICD-10-CM | POA: Diagnosis not present

## 2017-08-15 DIAGNOSIS — R0600 Dyspnea, unspecified: Secondary | ICD-10-CM | POA: Diagnosis not present

## 2017-08-15 LAB — CBC
HEMATOCRIT: 38.2 % (ref 36.0–46.0)
HEMOGLOBIN: 12.4 g/dL (ref 12.0–15.0)
MCH: 30.2 pg (ref 26.0–34.0)
MCHC: 32.5 g/dL (ref 30.0–36.0)
MCV: 93.2 fL (ref 78.0–100.0)
Platelets: 314 10*3/uL (ref 150–400)
RBC: 4.1 MIL/uL (ref 3.87–5.11)
RDW: 14.6 % (ref 11.5–15.5)
WBC: 8 10*3/uL (ref 4.0–10.5)

## 2017-08-15 LAB — BASIC METABOLIC PANEL
ANION GAP: 10 (ref 5–15)
BUN: 20 mg/dL (ref 6–20)
CHLORIDE: 100 mmol/L — AB (ref 101–111)
CO2: 26 mmol/L (ref 22–32)
Calcium: 8.6 mg/dL — ABNORMAL LOW (ref 8.9–10.3)
Creatinine, Ser: 0.93 mg/dL (ref 0.44–1.00)
GFR calc Af Amer: >60 mL/min (ref >60)
GFR, EST NON AFRICAN AMERICAN: 53 mL/min — AB (ref >60)
GLUCOSE: 92 mg/dL (ref 65–99)
POTASSIUM: 3.7 mmol/L (ref 3.5–5.1)
SODIUM: 136 mmol/L (ref 135–145)

## 2017-08-15 LAB — TSH: TSH: 1.522 u[IU]/mL (ref 0.350–4.500)

## 2017-08-15 LAB — PHOSPHORUS: PHOSPHORUS: 4.8 mg/dL — AB (ref 2.5–4.6)

## 2017-08-15 LAB — GLUCOSE, CAPILLARY: Glucose-Capillary: 85 mg/dL (ref 65–99)

## 2017-08-15 LAB — MAGNESIUM: MAGNESIUM: 2.4 mg/dL (ref 1.7–2.4)

## 2017-08-15 MED ORDER — GUAIFENESIN ER 600 MG PO TB12
1200.0000 mg | ORAL_TABLET | Freq: Two times a day (BID) | ORAL | Status: DC
  Administered 2017-08-15: 1200 mg via ORAL
  Filled 2017-08-15: qty 2

## 2017-08-15 MED ORDER — BENZONATATE 100 MG PO CAPS: 100.0000 mg | ORAL_CAPSULE | Freq: Two times a day (BID) | ORAL | 0 refills | Status: DC

## 2017-08-15 MED ORDER — GUAIFENESIN ER 600 MG PO TB12: 1200.0000 mg | ORAL_TABLET | Freq: Two times a day (BID) | ORAL | 0 refills | Status: AC

## 2017-08-15 NOTE — Progress Notes (Signed)
Patient Heart Rate dropped down to 39 bpm .  Patient asymptomatic, asleep.   Will contine to monitor.  Renleigh Ouellet, RN

## 2017-08-15 NOTE — Discharge Summary (Signed)
Physician Discharge Summary  Laura Knight ENI:[id number] DOB: 10-Sep-1924 DOA: 08/14/2017  PCP: Leeroy Cha, MD  Admit date: 08/14/2017 Discharge date: 08/15/2017  Admitted From: Home Disposition:  Home  Recommendations for Outpatient Follow-up:  1. Follow up with PCP in 1-2 weeks 2. Follow up with Cardiology Dr. Tamala Julian on 09/25/17 3. Please obtain CMP/CBC, Mag, Phos in one week 4. Please follow up on the following pending results:  Home Health: No Equipment/Devices: None recommended by PT  Discharge Condition: Stable CODE STATUS: FULL CODE  Diet recommendation: Heart Healthy Diet  Brief/Interim Summary: Laura Knight is a 83 y.o. female with medical history significant for abdominal aortic aneurysm and mild aortic stenosis who presents on 08/14/2017 with 3 days of cough and was found to have asymptomatic Mobitz type II heart block.  Over the past 3 days patient has had a nonproductive cough that has made it difficult for her to sleep.  Denies any associated chest pain, recent sick contacts, nausea or vomiting.  Endorses shortness of breath and subjective fevers and chills.  Patient states the cough is worse when she lies down.  No association with eating food.  No hemoptysis.  No dysphagia. CT angios chest abdomen and pelvis negative for aortic dissection.  Chest x-ray showed enlargement of cardiac silhouette.  Patient was given IV Lasix 40 mg x1 and [facility] for [facility]. Patient declined Pacer Intervention or Cardiology evaluation and symptoms improved. States she has had a cold recently. Denied any other complaints and Deemed medically stable to D/C home. She politely declined Palliative Care referral as an outpatient.   Discharge Diagnoses:  Active Problems:   AAA (abdominal aortic aneurysm) without rupture (HCC)   Aortic valve stenosis   Mobitz type 2 second degree AV block  Mobitz type II second-degree AV block, asymptomatic. -Discussed  with patient we does not want pacemaker, transcutaneous pacing.  Patient is aware risk of death with this arrhythmia.  Patient currently asymptomatic. -Continued to monitor on telemetry for 24 hours  Cough and shortness of breath, improving -Not requiring any supplemental oxygen.   -Chest x-ray not overly impressive for fluid overload.   -BNP is elevated and TTE does show diastolic dysfunction in the setting of known aortic stenosis patient is more susceptible to changes in fluid status -Status post IV Lasix 40 mg x1 with adequate output -SOB Improving -Continue supportive measures Robitussin, Tessalon Perles, incentive spirometry -Added Mucinex  -Follow up with PCP   Aortic valve stenosis, mild, asymptomatic -Query potential for possible CHF exacerbation (diastolic dysfunction) same shortness of breath is more associated with cough however cannot for sure rule out is not related to valvular dysfunction -Given 1 dose of IV Lasix and improved -Follow up with Cardiology Dr. Tamala Julian as an outpatient   Abdominal aortic aneurysm, stable -CT imaging shows no expansion of aneurysm and negative for dissection -Follow up with PCP as an outpatient   Discharge Instructions  Discharge Instructions    Call MD for:  difficulty breathing, headache or visual disturbances   Complete by:  As directed    Call MD for:  extreme fatigue   Complete by:  As directed    Call MD for:  hives   Complete by:  As directed    Call MD for:  persistant dizziness or light-headedness   Complete by:  As directed    Call MD for:  persistant nausea and vomiting   Complete by:  As directed    Call MD for:  redness,  tenderness, or signs of infection (pain, swelling, redness, odor or green/yellow discharge around incision site)   Complete by:  As directed    Call MD for:  severe uncontrolled pain   Complete by:  As directed    Call MD for:  temperature >100.4   Complete by:  As directed    Diet - low sodium heart  healthy   Complete by:  As directed    Discharge instructions   Complete by:  As directed    Follow up with PCP as an outpatient. Follow up with Cardiology as an outpatient. Take all medications as prescribed. If symptoms change or worsen please return to the ED for evaluation.   Increase activity slowly   Complete by:  As directed      Allergies as of 08/15/2017      Reactions   Amoxicillin Other (See Comments)   Unknown reaction Has patient had a PCN reaction causing immediate rash, facial/tongue/throat swelling, SOB or lightheadedness with hypotension: No Has patient had a PCN reaction causing severe rash involving mucus membranes or skin necrosis: Unknown Has patient had a PCN reaction that required hospitalization: Unknown Has patient had a PCN reaction occurring within the last 10 years: Unknown If all of the above answers are "NO", then may proceed with Cephalosporin use.   Sulfa Antibiotics Other (See Comments)   Unknown reaction   Sulfamethoxazole Rash, Hives      Medication List    TAKE these medications   acetaminophen 500 MG tablet Commonly known as:  TYLENOL Take 1,000 mg by mouth every 6 (six) hours as needed for mild pain or headache.   benzonatate 100 MG capsule Commonly known as:  TESSALON Take 1 capsule (100 mg total) by mouth 2 (two) times daily.   cetirizine 10 MG tablet Commonly known as:  ZYRTEC Take 10 mg by mouth daily.   docusate sodium 100 MG capsule Commonly known as:  COLACE Take 100 mg by mouth daily as needed for mild constipation.   fluticasone 50 MCG/ACT nasal spray Commonly known as:  FLONASE Place 1 spray into both nostrils daily as needed for allergies or rhinitis.   guaiFENesin 600 MG 12 hr tablet Commonly known as:  MUCINEX Take 2 tablets (1,200 mg total) by mouth 2 (two) times daily.   HYDROcodone-acetaminophen 5-325 MG tablet Commonly known as:  NORCO/VICODIN Take 1 tablet by mouth every 4 (four) hours as needed for moderate  pain or severe pain.   ibuprofen 600 MG tablet Commonly known as:  ADVIL,MOTRIN Take 1 tablet (600 mg total) by mouth every 6 (six) hours as needed for mild pain or moderate pain.   MULTI-DAY VITAMINS PO Take 1 tablet by mouth daily.   predniSONE 10 MG tablet Commonly known as:  DELTASONE Take 10 mg by mouth daily.   VITAMIN D PO Take 1 tablet by mouth daily. 500 - 1000 MG ONCE DAILY      Follow-up Information    Leeroy Cha, MD. Call.   Specialty:  Internal Medicine Why:  Follow up with PCP as an outpatient.  Contact information: [address] [place] [address] [phone]        Belva Crome, MD Follow up.   Specialty:  Cardiology Why:  Follow up within 1 week.  Contact information: [address] [place] [address] [phone]          Allergies  Allergen Reactions  . Amoxicillin Other (See Comments)  Unknown reaction Has patient had a PCN reaction causing immediate rash, facial/tongue/throat swelling, SOB or lightheadedness with hypotension: No Has patient had a PCN reaction causing severe rash involving mucus membranes or skin necrosis: Unknown Has patient had a PCN reaction that required hospitalization: Unknown Has patient had a PCN reaction occurring within the last 10 years: Unknown If all of the above answers are "NO", then may proceed with Cephalosporin use.   . Sulfa Antibiotics Other (See Comments)    Unknown reaction  . Sulfamethoxazole Rash and Hives   Consultations:  None  Procedures/Studies: Dg Chest Port 1 View  Result Date: 08/14/2017 CLINICAL DATA:  Cough and shortness of breath for 3 days, history GERD, former smoker EXAM: PORTABLE CHEST 1 VIEW COMPARISON:  Portable exam 0823 hours compared to 10/04/2016 FINDINGS: Enlargement of cardiac silhouette. Mediastinal contours and pulmonary vascularity normal. Atherosclerotic calcification of thoracic aorta. Scarring at lingula. Chronic  bronchitic changes without infiltrate, pleural effusion or pneumothorax. Bones diffusely demineralized. IMPRESSION: Enlargement of cardiac silhouette. Chronic bronchitic changes with lingular scarring. No acute abnormalities. Electronically Signed   By: Lavonia Dana M.D.   On: 08/14/2017 08:47   Ct Angio Chest/abd/pel For Dissection W And/or W/wo  Result Date: 08/14/2017 CLINICAL DATA:  83 year old with shortness of breath. Chest and back pain for 3 days. EXAM: CT ANGIOGRAPHY CHEST, ABDOMEN AND PELVIS TECHNIQUE: Multidetector CT imaging through the chest, abdomen and pelvis was performed using the standard protocol during bolus administration of intravenous contrast. Multiplanar reconstructed images and MIPs were obtained and reviewed to evaluate the vascular anatomy. CONTRAST:  169mL ISOVUE-370 IOPAMIDOL (ISOVUE-370) INJECTION 76% COMPARISON:  Chest radiograph 08/14/2017 FINDINGS: CTA CHEST FINDINGS Cardiovascular: Fusiform aneurysm of the ascending thoracic aorta measuring up to 4.5 cm. Aortic root at the sinuses of Valsalva measure roughly 3.9 cm. The sinotubular junction measures roughly 3.4 cm. Negative for an aortic dissection. There are coronary artery calcifications. Great vessels are patent. Aortic arch measures roughly 3.1 cm. Proximal descending thoracic aorta measures 3.0 cm. Aorta at the hiatus measures 3.3 cm. Atherosclerotic calcifications and disease in the thoracic aorta but no evidence for intramural hematoma. Main pulmonary arteries are patent. Heart size is mildly enlarged. No significant pericardial fluid. Mediastinum/Nodes: No significant mediastinal, hilar or axillary lymphadenopathy. Lungs/Pleura: Trachea and mainstem bronchi are patent. Scarring at the lung apices. No large pleural effusions. No significant airspace disease or consolidation in the lungs. Musculoskeletal: No acute bone abnormality. Review of the MIP images confirms the above findings. CTA ABDOMEN AND PELVIS FINDINGS  VASCULAR Aorta: Mild enlargement of the proximal abdominal aorta measuring up to 3.2 cm between celiac trunk and SMA. Negative for an aortic dissection. Celiac: Critical stenosis at the origin of the celiac artery. This stenosis may be secondary to median arcuate ligament compression. SMA: SMA is widely patent. Renals: Both renal arteries are patent without evidence of aneurysm, dissection, vasculitis, fibromuscular dysplasia or significant stenosis. IMA: IMA is patent. Inflow: Atherosclerotic disease in the iliac arteries without significant stenosis. Proximal femoral arteries are patent bilaterally. Veins: No obvious venous abnormality within the limitations of this arterial phase study. Review of the MIP images confirms the above findings. NON-VASCULAR Hepatobiliary: No acute abnormality to the liver or gallbladder. Pancreas: Normal appearance of the pancreas without inflammation or duct dilatation. Spleen: Normal appearance of spleen without enlargement. Adrenals/Urinary Tract: Fullness in the left adrenal gland which has Hounsfield units of less than 0 on the precontrast images. This is most compatible with a left adrenal adenoma. Mild fullness in the  right adrenal gland. Urinary bladder is unremarkable. Negative for hydronephrosis. No suspicious renal lesions. Stomach/Bowel: 2.1 x 1.6 cm nodular lesion just anterior to the stomach antrum on sequence 14, image 134. Not clear if this represents a peritoneal lesion or part of the stomach or adjacent liver. This lesion does enhance. Otherwise, normal appearance of the stomach. No evidence for bowel dilatation or obstruction. Lymphatic: No lymph node enlargement. Reproductive: Status post hysterectomy. No adnexal masses. Other: Small amount of free fluid in the pelvis. Negative for free air. Musculoskeletal: Bilateral hip replacements are located. Lucent material causing cortical destruction along the superior anterior right acetabulum. 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 Similar findings were present on the pelvic radiograph from 06/04/2013 but the disease has progressed. Old bilateral pubic rami fractures. Extensive sclerosis throughout the sacrum compatible with sacral insufficiency fractures. Multilevel degenerative disc disease in lumbar spine. Review of the MIP images confirms the above findings. IMPRESSION: Negative for an aortic dissection. Aneurysm of the ascending thoracic aortic measuring up to 4.5 cm. Ascending thoracic aortic aneurysm. Recommend semi-annual imaging followup by CTA or MRA and referral to cardiothoracic surgery if not already obtained. This recommendation follows 2010 ACCF/AHA/AATS/ACR/ASA/SCA/SCAI/SIR/STS/SVM Guidelines for the Diagnosis and Management of Patients With Thoracic Aortic Disease. Circulation. 2010; 121: O270-J500 Abdominal aortic aneurysm measuring 3.2 cm. 2.1 cm enhancing lesion along the anterior aspect of the gastric body. This could represent an exophytic lesion such as a GIST tumor or possibly a peritoneal lesion. Findings are nonspecific and consider short-term surveillance with CT or further characterized with a PET-CT. Small amount of free fluid in the pelvis which is nonspecific. Critical stenosis at origin of celiac trunk which may be related to median arcuate ligament compression rather than atherosclerotic disease. 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 This appears represent a chronic process. Sacral insufficiency fractures, probably chronic. Electronically Signed   By: Markus Daft M.D.   On: 08/14/2017 13:01    ECHOCARDIOGRAM ------------------------------------------------------------------- Study Conclusions  - Left ventricle: The cavity size was normal. Wall thickness was   increased in a pattern of mild LVH. Systolic function was   vigorous. The estimated ejection fraction was in the range of 65%   to 70%. Wall motion  was normal; there were no regional wall   motion abnormalities. Doppler parameters are consistent with   abnormal left ventricular relaxation (grade 1 diastolic   dysfunction). The E/e&' ratio is between 8-15, suggesting   indeterminate LV filling pressure. - Aortic valve: Calcified leaflets with mild stenosis. Mild   regurgitation. Mean gradient (S): 16 mm Hg. Peak gradient (S): 30   mm Hg. Valve area (VTI): 1.65 cm^2. Valve area (Vmax): 1.57 cm^2.   Valve area (Vmean): 1.61 cm^2. - Aorta: Dilated aortic root and ascending aorta. Aortic root   dimension: 41 mm (ED). Ascending aortic diameter: 45 mm (S). - Mitral valve: Mildly thickened leaflets . There was trivial   regurgitation. - Left atrium: The atrium was normal in size. - Tricuspid valve: There was mild regurgitation. - Pulmonary arteries: PA peak pressure: 28 mm Hg (S). - Inferior vena cava: The vessel was normal in size. The   respirophasic diameter changes were in the normal range (>= 50%),   consistent with normal central venous pressure.  Impressions:  - LVEF 65-70%, mild LVH, normal wall motion, grade 1 DD,   indeterminate LV fililng pressure, mild aortic stenosis - AVA   around 1.6 cm2, mild regurgitation, dilated  ascending aorta up to   4.5 cm, trivial MR, normal LA size, mild TR, RVSP 28 mmHg, normal   IVC.  Subjective: Seen and examined and stated SOB improved. No CP. Denied any lightheadedness or dizziness. Wanting to go home.   Discharge Exam: Vitals:   08/15/17 0022 08/15/17 0416  BP: (!) 144/68 (!) 154/46  Pulse: 80 75  Resp: 18 18  Temp: 97.7 F (36.5 C) (!) 97.4 F (36.3 C)  SpO2: 99% 97%   Vitals:   08/14/17 1545 08/14/17 2012 08/15/17 0022 08/15/17 0416  BP:  (!) 132/59 (!) 144/68 (!) 154/46  Pulse:  94 80 75  Resp:  18 18 18   Temp: 98.9 F (37.2 C) 98.5 F (36.9 C) 97.7 F (36.5 C) (!) 97.4 F (36.3 C)  TempSrc: Oral Oral Oral Oral  SpO2:  99% 99% 97%  Weight:    56 kg (123 lb 6.4 oz)   Height:       General: Pt is alert, awake, not in acute distress Cardiovascular: Slightly bradycardic and irregular, S1/S2 +, no rubs, no gallops; Has a systolic Murmur Respiratory: Diminished bilaterally, no wheezing, no rhonchi Abdominal: Soft, NT, ND, bowel sounds + Extremities: no edema, no cyanosis  The results of significant diagnostics from this hospitalization (including imaging, microbiology, ancillary and laboratory) are listed below for reference.    Microbiology: No results found for this or any previous visit (from the past 240 hour(s)).   Labs: BNP (last 3 results) Recent Labs    08/14/17 0800  BNP 269.4*   Basic Metabolic Panel: Recent Labs  Lab 08/14/17 0908 08/15/17 0541 08/15/17 0752  NA 140 136  --   K 4.0 3.7  --   CL 108 100*  --   CO2 24 26  --   GLUCOSE 89 92  --   BUN 13 20  --   CREATININE 0.85 0.93  --   CALCIUM 8.3* 8.6*  --   MG  --   --  2.4  PHOS  --   --  4.8*   Liver Function Tests: Recent Labs  Lab 08/14/17 0908  AST 18  ALT 13*  ALKPHOS 154*  BILITOT 0.7  PROT 6.4*  ALBUMIN 3.3*   No results for input(s): LIPASE, AMYLASE in the last 168 hours. No results for input(s): AMMONIA in the last 168 hours. CBC: Recent Labs  Lab 08/14/17 0908 08/15/17 0541  WBC 7.8 8.0  NEUTROABS 4.6  --   HGB 10.9* 12.4  HCT 34.0* 38.2  MCV 94.2 93.2  PLT 291 314   Cardiac Enzymes: Recent Labs  Lab 08/14/17 0908  TROPONINI <0.03   BNP: Invalid input(s): POCBNP CBG: Recent Labs  Lab 08/15/17 0547  GLUCAP 85   D-Dimer No results for input(s): DDIMER in the last 72 hours. Hgb A1c No results for input(s): HGBA1C in the last 72 hours. Lipid Profile No results for input(s): CHOL, HDL, LDLCALC, TRIG, CHOLHDL, LDLDIRECT in the last 72 hours. Thyroid function studies Recent Labs    08/15/17 0752  TSH 1.522   Anemia work up No results for input(s): VITAMINB12, FOLATE, FERRITIN, TIBC, IRON, RETICCTPCT in the last 72  hours. Urinalysis    Component Value Date/Time   COLORURINE YELLOW 06/16/2017 [place] 06/16/2017 1635   LABSPEC 1.013 06/16/2017 1635   PHURINE 6.0 06/16/2017 1635   GLUCOSEU NEGATIVE 06/16/2017 1635   HGBUR NEGATIVE 06/16/2017 1635   BILIRUBINUR NEGATIVE 06/16/2017 1635   KETONESUR NEGATIVE 06/16/2017  [place] 06/16/2017 1635   UROBILINOGEN 0.2 08/10/2014 0926   NITRITE NEGATIVE 06/16/2017 1635   LEUKOCYTESUR TRACE (A) 06/16/2017 1635   Sepsis Labs Invalid input(s): PROCALCITONIN,  WBC,  LACTICIDVEN Microbiology No results found for this or any previous visit (from the past 240 hour(s)).  Time coordinating discharge: 35 minutes  SIGNED:  Kerney Elbe, DO [facility] 08/15/2017, 12:38 PM Pager [phone]  If 7PM-7AM, please contact night-coverage [email or web address] Password [email or web address]

## 2017-08-15 NOTE — Evaluation (Signed)
Physical Therapy Evaluation Patient Details Name: Laura Knight MRN: [id number] DOB: 1924-09-07 Today's Date: 08/15/2017   History of Present Illness  83 y.o. female with medical history significant for abdominal aortic aneurysm and mild aortic stenosis who presents on 08/14/2017 with 3 days of cough and was found to have asymptomatic Mobitz type II heart block  Clinical Impression  Patient evaluated by Physical Therapy with no further acute PT needs identified. All education has been completed and the patient has no further questions. Pt appears to be at baseline level of mobility. Pt is currently mod I for bed mobility and min guard for transfers and ambulation of 75 feet with RW. Pt receives aid she needs from ALF. PT is signing off. Thank you for this referral.     Follow Up Recommendations No PT follow up;Supervision/Assistance - 24 hour    Equipment Recommendations  None recommended by PT    Recommendations for Other Services       Precautions / Restrictions Precautions Precautions: Fall Restrictions Weight Bearing Restrictions: No      Mobility  Bed Mobility Overal bed mobility: Needs Assistance Bed Mobility: Supine to Sit     Supine to sit: Modified independent (Device/Increase time)     General bed mobility comments: utilizes bed rails   Transfers Overall transfer level: Needs assistance Equipment used: Rolling walker (2 wheeled) Transfers: Sit to/from Stand Sit to Stand: Min guard         General transfer comment: min guard for safety  Ambulation/Gait Ambulation/Gait assistance: Min guard Ambulation Distance (Feet): 75 Feet Assistive device: Rolling walker (2 wheeled) Gait Pattern/deviations: Step-through pattern;Decreased step length - right;Decreased step length - left;Trunk flexed;Shuffle Gait velocity: slowed Gait velocity interpretation: Below normal speed for age/gender General Gait Details: hands on min guard for safety, vc for proximity to  walker and upright posture, pt with difficulty manging RW and complained stating her Rollator works better        Balance Overall balance assessment: Needs assistance Sitting-balance support: No upper extremity supported;Feet supported Sitting balance-Leahy Scale: Good     Standing balance support: During functional activity;Single extremity supported Standing balance-Leahy Scale: Poor Standing balance comment: able to stand at sink to wash face and brush teeth but required single UE support to maintain balance                             Pertinent Vitals/Pain Pain Assessment: Faces Faces Pain Scale: No hurt    Home Living Family/patient expects to be discharged to:: Other (Comment)(Independent living at [facility]) Living Arrangements: Alone                    Prior Function Level of Independence: Needs assistance   Gait / Transfers Assistance Needed: ambulates with Rollator within assisted living  ADL's / Homemaking Assistance Needed: independent with most ADLs, eats in dining room           Extremity/Trunk Assessment   Upper Extremity Assessment Upper Extremity Assessment: Overall WFL for tasks assessed    Lower Extremity Assessment Lower Extremity Assessment: Overall WFL for tasks assessed       Communication   Communication: No difficulties  Cognition Arousal/Alertness: Awake/alert Behavior During Therapy: WFL for tasks assessed/performed Overall Cognitive Status: Within Functional Limits for tasks assessed  Assessment/Plan    PT Assessment Patent does not need any further PT services         PT Goals (Current goals can be found in the Care Plan section)  Acute Rehab PT Goals Patient Stated Goal: get out of hospital PT Goal Formulation: With patient     AM-PAC PT "6 Clicks" Daily Activity  Outcome Measure Difficulty turning over in bed (including adjusting  bedclothes, sheets and blankets)?: None Difficulty moving from lying on back to sitting on the side of the bed? : A Little Difficulty sitting down on and standing up from a chair with arms (e.g., wheelchair, bedside commode, etc,.)?: A Little Help needed moving to and from a bed to chair (including a wheelchair)?: A Little Help needed walking in hospital room?: A Little Help needed climbing 3-5 steps with a railing? : A Lot 6 Click Score: 18    End of Session Equipment Utilized During Treatment: Gait belt Activity Tolerance: Patient tolerated treatment well Patient left: in chair;with call bell/phone within reach;with chair alarm set Nurse Communication: Mobility status PT Visit Diagnosis: Other abnormalities of gait and mobility (R26.89)    Time: 1130-1155 PT Time Calculation (min) (ACUTE ONLY): 25 min   Charges:   PT Evaluation $PT Eval Moderate Complexity: 1 Mod PT Treatments $Gait Training: 8-22 mins   PT G Codes:   PT G-Codes **NOT FOR INPATIENT CLASS** Functional Assessment Tool Used: AM-PAC 6 Clicks Basic Mobility Functional Limitation: Mobility: Walking and moving around Mobility: Walking and Moving Around Current Status (G3151): At least 40 percent but less than 60 percent impaired, limited or restricted Mobility: Walking and Moving Around Goal Status [phone]): At least 40 percent but less than 60 percent impaired, limited or restricted Mobility: Walking and Moving Around Discharge Status [phone]): At least 40 percent but less than 60 percent impaired, limited or restricted    Benjamine Mola B. Migdalia Dk PT, DPT Acute Rehabilitation  [phone] Pager [phone]    [place] 08/15/2017, 12:22 PM

## 2017-08-15 NOTE — Care Management Note (Signed)
Case Management Note  Patient Details  Name: Laura Knight MRN: [id number] Date of Birth: April 13, 1925  Subjective/Objective:   Presents on 08/14/2017 with 3 days of cough and was found to have asymptomatic Mobitz type II heart block, hx of abdominal aortic aneurysm and mild aortic stenosis.               [name] (Son)     [phone]       Action/Plan: Transition to home/ [facility] today. Son to provide transportation to home.  Expected Discharge Date:  08/15/17               Expected Discharge Plan:  Home/Self Care  In-House Referral:     Discharge planning Services  CM Consult  Post Acute Care Choice:    Choice offered to:     DME Arranged:    DME Agency:     HH Arranged:    HH Agency:     Status of Service:  Completed, signed off  If discussed at [employer] of Stay Meetings, dates discussed:    Additional Comments:  Sharin Mons, RN 08/15/2017, 1:08 PM

## 2017-08-16 DIAGNOSIS — S32501D Unspecified fracture of right pubis, subsequent encounter for fracture with routine healing: Secondary | ICD-10-CM | POA: Diagnosis not present

## 2017-08-16 DIAGNOSIS — Z9181 History of falling: Secondary | ICD-10-CM | POA: Diagnosis not present

## 2017-08-16 DIAGNOSIS — Z96643 Presence of artificial hip joint, bilateral: Secondary | ICD-10-CM | POA: Diagnosis not present

## 2017-08-16 DIAGNOSIS — W19XXXD Unspecified fall, subsequent encounter: Secondary | ICD-10-CM | POA: Diagnosis not present

## 2017-08-17 DIAGNOSIS — Z9181 History of falling: Secondary | ICD-10-CM | POA: Diagnosis not present

## 2017-08-17 DIAGNOSIS — S32501D Unspecified fracture of right pubis, subsequent encounter for fracture with routine healing: Secondary | ICD-10-CM | POA: Diagnosis not present

## 2017-08-17 DIAGNOSIS — W19XXXD Unspecified fall, subsequent encounter: Secondary | ICD-10-CM | POA: Diagnosis not present

## 2017-08-17 DIAGNOSIS — Z96643 Presence of artificial hip joint, bilateral: Secondary | ICD-10-CM | POA: Diagnosis not present

## 2017-08-18 DIAGNOSIS — Z9181 History of falling: Secondary | ICD-10-CM | POA: Diagnosis not present

## 2017-08-18 DIAGNOSIS — S32501D Unspecified fracture of right pubis, subsequent encounter for fracture with routine healing: Secondary | ICD-10-CM | POA: Diagnosis not present

## 2017-08-18 DIAGNOSIS — Z96643 Presence of artificial hip joint, bilateral: Secondary | ICD-10-CM | POA: Diagnosis not present

## 2017-08-18 DIAGNOSIS — W19XXXD Unspecified fall, subsequent encounter: Secondary | ICD-10-CM | POA: Diagnosis not present

## 2017-08-22 DIAGNOSIS — Z9181 History of falling: Secondary | ICD-10-CM | POA: Diagnosis not present

## 2017-08-22 DIAGNOSIS — W19XXXD Unspecified fall, subsequent encounter: Secondary | ICD-10-CM | POA: Diagnosis not present

## 2017-08-22 DIAGNOSIS — S32501D Unspecified fracture of right pubis, subsequent encounter for fracture with routine healing: Secondary | ICD-10-CM | POA: Diagnosis not present

## 2017-08-22 DIAGNOSIS — Z96643 Presence of artificial hip joint, bilateral: Secondary | ICD-10-CM | POA: Diagnosis not present

## 2017-08-23 ENCOUNTER — Telehealth: Payer: Self-pay | Admitting: *Deleted

## 2017-08-23 DIAGNOSIS — J40 Bronchitis, not specified as acute or chronic: Secondary | ICD-10-CM | POA: Diagnosis not present

## 2017-08-23 DIAGNOSIS — J209 Acute bronchitis, unspecified: Secondary | ICD-10-CM | POA: Diagnosis not present

## 2017-08-23 DIAGNOSIS — Z96643 Presence of artificial hip joint, bilateral: Secondary | ICD-10-CM | POA: Diagnosis not present

## 2017-08-23 DIAGNOSIS — Z9181 History of falling: Secondary | ICD-10-CM | POA: Diagnosis not present

## 2017-08-23 DIAGNOSIS — S32501D Unspecified fracture of right pubis, subsequent encounter for fracture with routine healing: Secondary | ICD-10-CM | POA: Diagnosis not present

## 2017-08-23 DIAGNOSIS — D500 Iron deficiency anemia secondary to blood loss (chronic): Secondary | ICD-10-CM | POA: Diagnosis not present

## 2017-08-23 DIAGNOSIS — W19XXXD Unspecified fall, subsequent encounter: Secondary | ICD-10-CM | POA: Diagnosis not present

## 2017-08-23 DIAGNOSIS — I4430 Unspecified atrioventricular block: Secondary | ICD-10-CM | POA: Diagnosis not present

## 2017-08-23 NOTE — Telephone Encounter (Signed)
Called and spoke with son, DPR on file.  He states since pt has gotten out of the hospital she has been fighting a cold.  Pt currently at PCP office for hosp f/u and to be seen in regards to the cold.  Son states he will speak with pt and see when she feels up to coming to cardiology and will call back to schedule.

## 2017-08-23 NOTE — Telephone Encounter (Signed)
-----   Message from Howie Ill sent at 08/17/2017  8:49 AM EST ----- Regarding: RE: Recent hospital admission, 08/14/2017 [facility]   I called patient son and he said he will call back and schedule her a follow up later, he did not want to set up a appointment at this time.  Antionette Poles ----- Message ----- From: Loren Racer, LPN Sent: [id number]/19/4712   7:24 AM To: Rebeca Alert [address] Scheduling Subject: FW: Recent hospital admission, 08/14/2017        ----- Message ----- From: Belva Crome, MD Sent: 08/15/2017  10:55 AM To: Loren Racer, LPN Subject: Recent hospital admission, 08/14/2017          She needs a 7-day TOC follow-up.

## 2017-08-24 DIAGNOSIS — W19XXXD Unspecified fall, subsequent encounter: Secondary | ICD-10-CM | POA: Diagnosis not present

## 2017-08-24 DIAGNOSIS — Z96643 Presence of artificial hip joint, bilateral: Secondary | ICD-10-CM | POA: Diagnosis not present

## 2017-08-24 DIAGNOSIS — Z9181 History of falling: Secondary | ICD-10-CM | POA: Diagnosis not present

## 2017-08-24 DIAGNOSIS — S32501D Unspecified fracture of right pubis, subsequent encounter for fracture with routine healing: Secondary | ICD-10-CM | POA: Diagnosis not present

## 2017-08-25 ENCOUNTER — Ambulatory Visit: Payer: Medicare Other | Admitting: Nurse Practitioner

## 2017-08-28 DIAGNOSIS — W19XXXD Unspecified fall, subsequent encounter: Secondary | ICD-10-CM | POA: Diagnosis not present

## 2017-08-28 DIAGNOSIS — S32501D Unspecified fracture of right pubis, subsequent encounter for fracture with routine healing: Secondary | ICD-10-CM | POA: Diagnosis not present

## 2017-08-28 DIAGNOSIS — Z96643 Presence of artificial hip joint, bilateral: Secondary | ICD-10-CM | POA: Diagnosis not present

## 2017-08-28 DIAGNOSIS — Z9181 History of falling: Secondary | ICD-10-CM | POA: Diagnosis not present

## 2017-09-01 DIAGNOSIS — Z96643 Presence of artificial hip joint, bilateral: Secondary | ICD-10-CM | POA: Diagnosis not present

## 2017-09-01 DIAGNOSIS — Z9181 History of falling: Secondary | ICD-10-CM | POA: Diagnosis not present

## 2017-09-01 DIAGNOSIS — S32501D Unspecified fracture of right pubis, subsequent encounter for fracture with routine healing: Secondary | ICD-10-CM | POA: Diagnosis not present

## 2017-09-01 DIAGNOSIS — W19XXXD Unspecified fall, subsequent encounter: Secondary | ICD-10-CM | POA: Diagnosis not present

## 2017-09-04 DIAGNOSIS — S32501D Unspecified fracture of right pubis, subsequent encounter for fracture with routine healing: Secondary | ICD-10-CM | POA: Diagnosis not present

## 2017-09-04 DIAGNOSIS — W19XXXD Unspecified fall, subsequent encounter: Secondary | ICD-10-CM | POA: Diagnosis not present

## 2017-09-04 DIAGNOSIS — Z9181 History of falling: Secondary | ICD-10-CM | POA: Diagnosis not present

## 2017-09-04 DIAGNOSIS — Z96643 Presence of artificial hip joint, bilateral: Secondary | ICD-10-CM | POA: Diagnosis not present

## 2017-09-07 DIAGNOSIS — S32501D Unspecified fracture of right pubis, subsequent encounter for fracture with routine healing: Secondary | ICD-10-CM | POA: Diagnosis not present

## 2017-09-07 DIAGNOSIS — W19XXXD Unspecified fall, subsequent encounter: Secondary | ICD-10-CM | POA: Diagnosis not present

## 2017-09-07 DIAGNOSIS — Z9181 History of falling: Secondary | ICD-10-CM | POA: Diagnosis not present

## 2017-09-07 DIAGNOSIS — Z96643 Presence of artificial hip joint, bilateral: Secondary | ICD-10-CM | POA: Diagnosis not present

## 2017-09-11 ENCOUNTER — Ambulatory Visit
Admission: RE | Admit: 2017-09-11 | Discharge: 2017-09-11 | Disposition: A | Discharge status: Home / Self Care | Payer: Medicare Other | Source: Ambulatory Visit | Attending: Internal Medicine | Admitting: Internal Medicine

## 2017-09-11 ENCOUNTER — Other Ambulatory Visit: Payer: Self-pay | Admitting: Internal Medicine

## 2017-09-11 DIAGNOSIS — I716 Thoracoabdominal aortic aneurysm, without rupture: Secondary | ICD-10-CM | POA: Diagnosis not present

## 2017-09-11 DIAGNOSIS — S2242XA Multiple fractures of ribs, left side, initial encounter for closed fracture: Secondary | ICD-10-CM | POA: Diagnosis not present

## 2017-09-11 DIAGNOSIS — W19XXXD Unspecified fall, subsequent encounter: Secondary | ICD-10-CM | POA: Diagnosis not present

## 2017-09-11 DIAGNOSIS — R935 Abnormal findings on diagnostic imaging of other abdominal regions, including retroperitoneum: Secondary | ICD-10-CM | POA: Diagnosis not present

## 2017-09-11 DIAGNOSIS — R0781 Pleurodynia: Secondary | ICD-10-CM

## 2017-09-11 DIAGNOSIS — Z9181 History of falling: Secondary | ICD-10-CM | POA: Diagnosis not present

## 2017-09-11 DIAGNOSIS — S32501D Unspecified fracture of right pubis, subsequent encounter for fracture with routine healing: Secondary | ICD-10-CM | POA: Diagnosis not present

## 2017-09-11 DIAGNOSIS — S2241XA Multiple fractures of ribs, right side, initial encounter for closed fracture: Secondary | ICD-10-CM | POA: Diagnosis not present

## 2017-09-11 DIAGNOSIS — R350 Frequency of micturition: Secondary | ICD-10-CM | POA: Diagnosis not present

## 2017-09-11 DIAGNOSIS — Z96643 Presence of artificial hip joint, bilateral: Secondary | ICD-10-CM | POA: Diagnosis not present

## 2017-09-11 DIAGNOSIS — W19XXXA Unspecified fall, initial encounter: Secondary | ICD-10-CM | POA: Diagnosis not present

## 2017-09-14 DIAGNOSIS — S32501D Unspecified fracture of right pubis, subsequent encounter for fracture with routine healing: Secondary | ICD-10-CM | POA: Diagnosis not present

## 2017-09-14 DIAGNOSIS — Z96643 Presence of artificial hip joint, bilateral: Secondary | ICD-10-CM | POA: Diagnosis not present

## 2017-09-14 DIAGNOSIS — Z9181 History of falling: Secondary | ICD-10-CM | POA: Diagnosis not present

## 2017-09-14 DIAGNOSIS — W19XXXD Unspecified fall, subsequent encounter: Secondary | ICD-10-CM | POA: Diagnosis not present

## 2017-09-18 DIAGNOSIS — Z96643 Presence of artificial hip joint, bilateral: Secondary | ICD-10-CM | POA: Diagnosis not present

## 2017-09-18 DIAGNOSIS — W19XXXD Unspecified fall, subsequent encounter: Secondary | ICD-10-CM | POA: Diagnosis not present

## 2017-09-18 DIAGNOSIS — S32501D Unspecified fracture of right pubis, subsequent encounter for fracture with routine healing: Secondary | ICD-10-CM | POA: Diagnosis not present

## 2017-09-18 DIAGNOSIS — Z9181 History of falling: Secondary | ICD-10-CM | POA: Diagnosis not present

## 2017-09-18 NOTE — Progress Notes (Deleted)
Cardiology Office Note   Date:  09/18/2017   ID:  Laura Knight, DOB 1925/05/30, MRN [id number]  PCP:  Leeroy Cha, MD  Cardiologist:  ***    No chief complaint on file.     History of Present Illness: Laura Knight is a 84 y.o. female who presents for post hospitalization.  Was admitted with cold symptoms she had  SR then mobitz II.  HR low at times.  She refused cardiology consult and pacemaker.  Not on AV slowing meds.    abdominal aortic aneurysm measuring 3.2 x 3.4 cm on a recent MRI done to evaluate the lumbar spine.  Mild AS on echo   Abdominal Aorta: No evidence of an abdominal aortic aneurysm was visualized. The largest aortic measurement is 2.2 cm. Past Medical History:  Diagnosis Date  . Acute bilateral low back pain without sciatica   . Aftercare following surgery of the musculoskeletal system   . Arthritis   . Contusion of left hip    initial encounter  . GERD (gastroesophageal reflux disease)   . History of blood transfusion yrs ago  . Left hip pain   . Osteoarthritis of left hip    unspecified osteoarthritis type  . 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 Status post bilateral hip replacements   . Trochanteric bursitis, right hip     Past Surgical History:  Procedure Laterality Date  . ABDOMINAL HYSTERECTOMY     1 ovary removed  . bilateral knee replacment  2008  . JOINT REPLACEMENT    . right hip replacement  1977  . TOTAL HIP ARTHROPLASTY Left 06/04/2013   Procedure: LEFT TOTAL HIP ARTHROPLASTY ANTERIOR APPROACH;  Surgeon: Mauri Pole, MD;  Location: [facility];  Service: Orthopedics;  Laterality: Left;     Current Outpatient Medications  Medication Sig Dispense Refill  . acetaminophen (TYLENOL) 500 MG tablet Take 1,000 mg by mouth every 6 (six) hours as needed for mild pain or headache.    . benzonatate (TESSALON) 100 MG capsule Take 1 capsule (100 mg total) by mouth 2 (two) times daily. 20 capsule 0  .  cetirizine (ZYRTEC) 10 MG tablet Take 10 mg by mouth daily.    . Cholecalciferol (VITAMIN D PO) Take 1 tablet by mouth daily. 500 - 1000 MG ONCE DAILY    . docusate sodium (COLACE) 100 MG capsule Take 100 mg by mouth daily as needed for mild constipation.    . fluticasone (FLONASE) 50 MCG/ACT nasal spray Place 1 spray into both nostrils daily as needed for allergies or rhinitis.   5  . guaiFENesin (MUCINEX) 600 MG 12 hr tablet Take 2 tablets (1,200 mg total) by mouth 2 (two) times daily. 20 tablet 0  . HYDROcodone-acetaminophen (NORCO/VICODIN) 5-325 MG tablet Take 1 tablet by mouth every 4 (four) hours as needed for moderate pain or severe pain. (Patient not taking: Reported on 08/14/2017) 10 tablet 0  . ibuprofen (ADVIL,MOTRIN) 600 MG tablet Take 1 tablet (600 mg total) by mouth every 6 (six) hours as needed for mild pain or moderate pain. (Patient not taking: Reported on 08/14/2017) 20 tablet 0  . Multiple Vitamin (MULTI-DAY VITAMINS PO) Take 1 tablet by mouth daily.    . predniSONE (DELTASONE) 10 MG tablet Take 10 mg by mouth daily.  3   No current facility-administered medications for this visit.     Allergies:   Amoxicillin; Sulfa antibiotics; and Sulfamethoxazole    Social History:  The patient  reports that she has quit smoking. Her smoking use included cigarettes. She has a 6.00 pack-year smoking history. she has never used smokeless tobacco. She reports that she drinks alcohol. She reports that she does not use drugs.   Family History:  The patient's ***family history includes Aortic stenosis in her son.    ROS:  General:no colds or fevers, no weight changes Skin:no rashes or ulcers HEENT:no blurred vision, no congestion CV:see HPI PUL:see HPI GI:no diarrhea constipation or melena, no indigestion GU:no hematuria, no dysuria MS:no joint pain, no claudication Neuro:no syncope, no lightheadedness Endo:no diabetes, no thyroid disease Wt Readings from Last 3 Encounters:  08/15/17  123 lb 6.4 oz (56 kg)  08/04/17 134 lb 6.4 oz (61 kg)  06/16/17 140 lb (63.5 kg)     PHYSICAL EXAM: VS:  There were no vitals taken for this visit. , BMI There is no height or weight on file to calculate BMI. General:Pleasant affect, NAD Skin:Warm and dry, brisk capillary refill HEENT:normocephalic, sclera clear, mucus membranes moist Neck:supple, no JVD, no bruits  Heart:S1S2 RRR without murmur, gallup, rub or click Lungs:clear without rales, rhonchi, or wheezes TJQ:ZESP, non tender, + BS, do not palpate liver spleen or masses Ext:no lower ext edema, 2+ pedal pulses, 2+ radial pulses Neuro:alert and oriented, MAE, follows commands, + facial symmetry    EKG:  EKG is ordered today. The ekg ordered today demonstrates ***   Recent Labs: 08/14/2017: ALT 13; B Natriuretic Peptide 316.7 08/15/2017: BUN 20; Creatinine, Ser 0.93; Hemoglobin 12.4; Magnesium 2.4; Platelets 314; Potassium 3.7; Sodium 136; TSH 1.522    Lipid Panel No results found for: CHOL, TRIG, HDL, CHOLHDL, VLDL, LDLCALC, LDLDIRECT     Other studies Reviewed: Additional studies/ records that were reviewed today include: ***.   ASSESSMENT AND PLAN:  1.  ***   Current medicines are reviewed with the patient today.  The patient Has no concerns regarding medicines.  The following changes have been made:  See above Labs/ tests ordered today include:see above  Disposition:   FU:  see above  Signed, Cecilie Kicks, NP  09/18/2017 10:21 PM    [facility] [place], [place], [place], [place] Phone: [phone]; Fax: [phone]

## 2017-09-19 ENCOUNTER — Ambulatory Visit: Payer: Medicare Other | Admitting: Cardiology

## 2017-09-20 DIAGNOSIS — A09 Infectious gastroenteritis and colitis, unspecified: Secondary | ICD-10-CM | POA: Diagnosis not present

## 2017-09-20 DIAGNOSIS — R933 Abnormal findings on diagnostic imaging of other parts of digestive tract: Secondary | ICD-10-CM | POA: Diagnosis not present

## 2017-09-22 DIAGNOSIS — Z9181 History of falling: Secondary | ICD-10-CM | POA: Diagnosis not present

## 2017-09-22 DIAGNOSIS — S32501D Unspecified fracture of right pubis, subsequent encounter for fracture with routine healing: Secondary | ICD-10-CM | POA: Diagnosis not present

## 2017-09-22 DIAGNOSIS — W19XXXD Unspecified fall, subsequent encounter: Secondary | ICD-10-CM | POA: Diagnosis not present

## 2017-09-22 DIAGNOSIS — Z96643 Presence of artificial hip joint, bilateral: Secondary | ICD-10-CM | POA: Diagnosis not present

## 2017-09-25 ENCOUNTER — Ambulatory Visit: Payer: Medicare Other | Admitting: Interventional Cardiology

## 2017-09-25 DIAGNOSIS — W19XXXD Unspecified fall, subsequent encounter: Secondary | ICD-10-CM | POA: Diagnosis not present

## 2017-09-25 DIAGNOSIS — S32501D Unspecified fracture of right pubis, subsequent encounter for fracture with routine healing: Secondary | ICD-10-CM | POA: Diagnosis not present

## 2017-09-25 DIAGNOSIS — Z96643 Presence of artificial hip joint, bilateral: Secondary | ICD-10-CM | POA: Diagnosis not present

## 2017-09-25 DIAGNOSIS — Z9181 History of falling: Secondary | ICD-10-CM | POA: Diagnosis not present

## 2017-09-27 DIAGNOSIS — W19XXXD Unspecified fall, subsequent encounter: Secondary | ICD-10-CM | POA: Diagnosis not present

## 2017-09-27 DIAGNOSIS — Z96643 Presence of artificial hip joint, bilateral: Secondary | ICD-10-CM | POA: Diagnosis not present

## 2017-09-27 DIAGNOSIS — S32501D Unspecified fracture of right pubis, subsequent encounter for fracture with routine healing: Secondary | ICD-10-CM | POA: Diagnosis not present

## 2017-09-27 DIAGNOSIS — Z9181 History of falling: Secondary | ICD-10-CM | POA: Diagnosis not present

## 2017-10-02 DIAGNOSIS — S32501D Unspecified fracture of right pubis, subsequent encounter for fracture with routine healing: Secondary | ICD-10-CM | POA: Diagnosis not present

## 2017-10-02 DIAGNOSIS — Z96643 Presence of artificial hip joint, bilateral: Secondary | ICD-10-CM | POA: Diagnosis not present

## 2017-10-02 DIAGNOSIS — Z9181 History of falling: Secondary | ICD-10-CM | POA: Diagnosis not present

## 2017-10-02 DIAGNOSIS — W19XXXD Unspecified fall, subsequent encounter: Secondary | ICD-10-CM | POA: Diagnosis not present

## 2017-10-04 DIAGNOSIS — Z9181 History of falling: Secondary | ICD-10-CM | POA: Diagnosis not present

## 2017-10-04 DIAGNOSIS — W19XXXD Unspecified fall, subsequent encounter: Secondary | ICD-10-CM | POA: Diagnosis not present

## 2017-10-04 DIAGNOSIS — S32501D Unspecified fracture of right pubis, subsequent encounter for fracture with routine healing: Secondary | ICD-10-CM | POA: Diagnosis not present

## 2017-10-04 DIAGNOSIS — Z96643 Presence of artificial hip joint, bilateral: Secondary | ICD-10-CM | POA: Diagnosis not present

## 2017-10-09 DIAGNOSIS — W19XXXD Unspecified fall, subsequent encounter: Secondary | ICD-10-CM | POA: Diagnosis not present

## 2017-10-09 DIAGNOSIS — S32501D Unspecified fracture of right pubis, subsequent encounter for fracture with routine healing: Secondary | ICD-10-CM | POA: Diagnosis not present

## 2017-10-09 DIAGNOSIS — Z96643 Presence of artificial hip joint, bilateral: Secondary | ICD-10-CM | POA: Diagnosis not present

## 2017-10-09 DIAGNOSIS — Z9181 History of falling: Secondary | ICD-10-CM | POA: Diagnosis not present

## 2017-10-13 DIAGNOSIS — S32501D Unspecified fracture of right pubis, subsequent encounter for fracture with routine healing: Secondary | ICD-10-CM | POA: Diagnosis not present

## 2017-10-13 DIAGNOSIS — Z9181 History of falling: Secondary | ICD-10-CM | POA: Diagnosis not present

## 2017-10-13 DIAGNOSIS — W19XXXD Unspecified fall, subsequent encounter: Secondary | ICD-10-CM | POA: Diagnosis not present

## 2017-10-13 DIAGNOSIS — Z96643 Presence of artificial hip joint, bilateral: Secondary | ICD-10-CM | POA: Diagnosis not present

## 2017-10-17 DIAGNOSIS — Z9181 History of falling: Secondary | ICD-10-CM | POA: Diagnosis not present

## 2017-10-17 DIAGNOSIS — Z96643 Presence of artificial hip joint, bilateral: Secondary | ICD-10-CM | POA: Diagnosis not present

## 2017-10-17 DIAGNOSIS — S32501D Unspecified fracture of right pubis, subsequent encounter for fracture with routine healing: Secondary | ICD-10-CM | POA: Diagnosis not present

## 2017-10-17 DIAGNOSIS — W19XXXD Unspecified fall, subsequent encounter: Secondary | ICD-10-CM | POA: Diagnosis not present

## 2017-10-18 DIAGNOSIS — Z96643 Presence of artificial hip joint, bilateral: Secondary | ICD-10-CM | POA: Diagnosis not present

## 2017-10-18 DIAGNOSIS — S32501D Unspecified fracture of right pubis, subsequent encounter for fracture with routine healing: Secondary | ICD-10-CM | POA: Diagnosis not present

## 2017-10-18 DIAGNOSIS — Z9181 History of falling: Secondary | ICD-10-CM | POA: Diagnosis not present

## 2017-10-18 DIAGNOSIS — W19XXXD Unspecified fall, subsequent encounter: Secondary | ICD-10-CM | POA: Diagnosis not present

## 2017-10-24 DIAGNOSIS — I4430 Unspecified atrioventricular block: Secondary | ICD-10-CM | POA: Diagnosis not present

## 2017-10-24 DIAGNOSIS — I716 Thoracoabdominal aortic aneurysm, without rupture: Secondary | ICD-10-CM | POA: Diagnosis not present

## 2017-10-24 DIAGNOSIS — R030 Elevated blood-pressure reading, without diagnosis of hypertension: Secondary | ICD-10-CM | POA: Diagnosis not present

## 2017-10-24 DIAGNOSIS — J40 Bronchitis, not specified as acute or chronic: Secondary | ICD-10-CM | POA: Diagnosis not present

## 2017-10-25 DIAGNOSIS — M6281 Muscle weakness (generalized): Secondary | ICD-10-CM | POA: Diagnosis not present

## 2017-10-25 DIAGNOSIS — R2681 Unsteadiness on feet: Secondary | ICD-10-CM | POA: Diagnosis not present

## 2017-10-25 DIAGNOSIS — Z9181 History of falling: Secondary | ICD-10-CM | POA: Diagnosis not present

## 2017-10-26 DIAGNOSIS — R2681 Unsteadiness on feet: Secondary | ICD-10-CM | POA: Diagnosis not present

## 2017-10-26 DIAGNOSIS — M6281 Muscle weakness (generalized): Secondary | ICD-10-CM | POA: Diagnosis not present

## 2017-10-26 DIAGNOSIS — Z9181 History of falling: Secondary | ICD-10-CM | POA: Diagnosis not present

## 2017-10-27 ENCOUNTER — Encounter: Payer: Self-pay | Admitting: Cardiology

## 2017-10-27 DIAGNOSIS — R2681 Unsteadiness on feet: Secondary | ICD-10-CM | POA: Diagnosis not present

## 2017-10-27 DIAGNOSIS — Z9181 History of falling: Secondary | ICD-10-CM | POA: Diagnosis not present

## 2017-10-27 DIAGNOSIS — M6281 Muscle weakness (generalized): Secondary | ICD-10-CM | POA: Diagnosis not present

## 2017-10-31 DIAGNOSIS — Z9181 History of falling: Secondary | ICD-10-CM | POA: Diagnosis not present

## 2017-10-31 DIAGNOSIS — M6281 Muscle weakness (generalized): Secondary | ICD-10-CM | POA: Diagnosis not present

## 2017-10-31 DIAGNOSIS — R2681 Unsteadiness on feet: Secondary | ICD-10-CM | POA: Diagnosis not present

## 2017-11-02 ENCOUNTER — Ambulatory Visit: Payer: Medicare Other | Admitting: Cardiology

## 2017-11-02 DIAGNOSIS — M6281 Muscle weakness (generalized): Secondary | ICD-10-CM | POA: Diagnosis not present

## 2017-11-02 DIAGNOSIS — R2681 Unsteadiness on feet: Secondary | ICD-10-CM | POA: Diagnosis not present

## 2017-11-02 DIAGNOSIS — Z9181 History of falling: Secondary | ICD-10-CM | POA: Diagnosis not present

## 2017-11-03 DIAGNOSIS — R2681 Unsteadiness on feet: Secondary | ICD-10-CM | POA: Diagnosis not present

## 2017-11-03 DIAGNOSIS — Z9181 History of falling: Secondary | ICD-10-CM | POA: Diagnosis not present

## 2017-11-03 DIAGNOSIS — M6281 Muscle weakness (generalized): Secondary | ICD-10-CM | POA: Diagnosis not present

## 2017-11-06 DIAGNOSIS — J309 Allergic rhinitis, unspecified: Secondary | ICD-10-CM | POA: Diagnosis not present

## 2017-11-06 DIAGNOSIS — J40 Bronchitis, not specified as acute or chronic: Secondary | ICD-10-CM | POA: Diagnosis not present

## 2017-11-06 DIAGNOSIS — I4430 Unspecified atrioventricular block: Secondary | ICD-10-CM | POA: Diagnosis not present

## 2017-11-09 DIAGNOSIS — R2681 Unsteadiness on feet: Secondary | ICD-10-CM | POA: Diagnosis not present

## 2017-11-09 DIAGNOSIS — M6281 Muscle weakness (generalized): Secondary | ICD-10-CM | POA: Diagnosis not present

## 2017-11-09 DIAGNOSIS — Z9181 History of falling: Secondary | ICD-10-CM | POA: Diagnosis not present

## 2017-11-13 DIAGNOSIS — Z9181 History of falling: Secondary | ICD-10-CM | POA: Diagnosis not present

## 2017-11-13 DIAGNOSIS — R2681 Unsteadiness on feet: Secondary | ICD-10-CM | POA: Diagnosis not present

## 2017-11-13 DIAGNOSIS — M6281 Muscle weakness (generalized): Secondary | ICD-10-CM | POA: Diagnosis not present

## 2017-11-14 ENCOUNTER — Ambulatory Visit: Payer: Medicare Other | Admitting: Physician Assistant

## 2017-11-15 DIAGNOSIS — R2681 Unsteadiness on feet: Secondary | ICD-10-CM | POA: Diagnosis not present

## 2017-11-15 DIAGNOSIS — Z9181 History of falling: Secondary | ICD-10-CM | POA: Diagnosis not present

## 2017-11-15 DIAGNOSIS — M6281 Muscle weakness (generalized): Secondary | ICD-10-CM | POA: Diagnosis not present

## 2017-11-16 ENCOUNTER — Emergency Department (HOSPITAL_COMMUNITY): Payer: Medicare Other

## 2017-11-16 ENCOUNTER — Encounter (HOSPITAL_COMMUNITY): Payer: Self-pay

## 2017-11-16 ENCOUNTER — Emergency Department (HOSPITAL_COMMUNITY)
Admission: EM | Admit: 2017-11-16 | Discharge: 2017-11-16 | Disposition: A | Discharge status: Home / Self Care | Payer: Medicare Other | Attending: Emergency Medicine | Admitting: Emergency Medicine

## 2017-11-16 DIAGNOSIS — Z96642 Presence of left artificial hip joint: Secondary | ICD-10-CM | POA: Insufficient documentation

## 2017-11-16 DIAGNOSIS — Z79899 Other long term (current) drug therapy: Secondary | ICD-10-CM | POA: Insufficient documentation

## 2017-11-16 DIAGNOSIS — R2681 Unsteadiness on feet: Secondary | ICD-10-CM | POA: Diagnosis not present

## 2017-11-16 DIAGNOSIS — Z9181 History of falling: Secondary | ICD-10-CM | POA: Diagnosis not present

## 2017-11-16 DIAGNOSIS — M6281 Muscle weakness (generalized): Secondary | ICD-10-CM | POA: Diagnosis not present

## 2017-11-16 DIAGNOSIS — S299XXA Unspecified injury of thorax, initial encounter: Secondary | ICD-10-CM | POA: Diagnosis not present

## 2017-11-16 DIAGNOSIS — I10 Essential (primary) hypertension: Secondary | ICD-10-CM | POA: Diagnosis not present

## 2017-11-16 DIAGNOSIS — Z87891 Personal history of nicotine dependence: Secondary | ICD-10-CM | POA: Insufficient documentation

## 2017-11-16 DIAGNOSIS — R6889 Other general symptoms and signs: Secondary | ICD-10-CM | POA: Diagnosis not present

## 2017-11-16 DIAGNOSIS — J181 Lobar pneumonia, unspecified organism: Secondary | ICD-10-CM | POA: Insufficient documentation

## 2017-11-16 DIAGNOSIS — R05 Cough: Secondary | ICD-10-CM | POA: Diagnosis present

## 2017-11-16 DIAGNOSIS — J189 Pneumonia, unspecified organism: Secondary | ICD-10-CM

## 2017-11-16 MED ORDER — DOXYCYCLINE HYCLATE 100 MG PO CAPS: 100.0000 mg | ORAL_CAPSULE | Freq: Two times a day (BID) | ORAL | 0 refills | Status: DC

## 2017-11-16 NOTE — Discharge Instructions (Signed)
Your cxr looked like you might have pneumonia.  I am going to start you on a different antibiotic.  Please return for worsening.  Follow up with your PCP.

## 2017-11-16 NOTE — ED Provider Notes (Signed)
[facility] DEPT Provider Note   CSN: [id number] Arrival date & time: 11/16/17  1756     History   Chief Complaint Chief Complaint  Patient presents with  . Fall    HPI Laura Knight is a 84 y.o. female.  84 yo F with a chief complaint of recurrent falls.  This been going on for the past couple weeks.  The patient has had difficulty with ambulation for years.  She has trouble with her balance and uses a walker.  She fell today and had trouble getting up off the ground.  She denies any injury from the fall.  Denies head injury loss of consciousness denies neck pain back pain chest pain abdominal pain upper or lower extremity pain.  She has had a recurrent cough.  She was diagnosed with bronchitis about 5 weeks ago and was given a Z-Pak.  She had finished this and her cough seemed to improve but then reoccurred.  She denies fevers or chills.  Denies shortness of breath.  The history is provided by the patient and a relative.  Fall  This is a new problem. The current episode started 2 days ago. The problem occurs constantly. The problem has not changed since onset.Pertinent negatives include no chest pain, no abdominal pain, no headaches and no shortness of breath. Nothing aggravates the symptoms. Nothing relieves the symptoms. She has tried nothing for the symptoms. The treatment provided no relief.    Past Medical History:  Diagnosis Date  . Acute bilateral low back pain without sciatica   . Aftercare following surgery of the musculoskeletal system   . Arthritis   . Contusion of left hip    initial encounter  . GERD (gastroesophageal reflux disease)   . History of blood transfusion yrs ago  . Left hip pain   . Osteoarthritis of left hip    unspecified osteoarthritis type  . 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 Status post bilateral hip replacements   . Trochanteric bursitis, right hip     Patient Active Problem  List   Diagnosis Date Noted  . Mobitz type 2 second degree AV block 08/14/2017  . AAA (abdominal aortic aneurysm) without rupture ([place]) 08/04/2017  . Aortic valve stenosis 08/04/2017  . Esophageal reflux 07/09/2013  . Constipation 06/18/2013  . Osteoarthritis of left hip S/P Left Total Hip Arthroplasty 06/18/2013  . Expected blood loss anemia 06/05/2013  . S/P left THA, AA 06/04/2013    Past Surgical History:  Procedure Laterality Date  . ABDOMINAL HYSTERECTOMY     1 ovary removed  . bilateral knee replacment  2008  . JOINT REPLACEMENT    . right hip replacement  1977  . TOTAL HIP ARTHROPLASTY Left 06/04/2013   Procedure: LEFT TOTAL HIP ARTHROPLASTY ANTERIOR APPROACH;  Surgeon: Mauri Pole, MD;  Location: [facility];  Service: Orthopedics;  Laterality: Left;     OB History   None      Home Medications    Prior to Admission medications   Medication Sig Start Date End Date Taking? Authorizing Provider  acetaminophen (TYLENOL) 500 MG tablet Take 1,000 mg by mouth every 6 (six) hours as needed for mild pain or headache.    [provider]  benzonatate (TESSALON) 100 MG capsule Take 1 capsule (100 mg total) by mouth 2 (two) times daily. 08/15/17   Raiford Noble Latif, DO  cetirizine (ZYRTEC) 10 MG tablet Take 10 mg by mouth daily.  [provider]  Cholecalciferol (VITAMIN D PO) Take 1 tablet by mouth daily. 500 - 1000 MG ONCE DAILY    [provider]  docusate sodium (COLACE) 100 MG capsule Take 100 mg by mouth daily as needed for mild constipation.    [provider]  doxycycline (VIBRAMYCIN) 100 MG capsule Take 1 capsule (100 mg total) by mouth 2 (two) times daily. One po bid x 7 days 11/16/17   Deno Etienne, DO  fluticasone [facility]) 50 MCG/ACT nasal spray Place 1 spray into both nostrils daily as needed for allergies or rhinitis.  03/22/17   [provider]  guaiFENesin (MUCINEX) 600 MG 12 hr tablet Take 2 tablets (1,200 mg total) by  mouth 2 (two) times daily. 08/15/17   Raiford Noble Latif, DO  HYDROcodone-acetaminophen (NORCO/VICODIN) 5-325 MG tablet Take 1 tablet by mouth every 4 (four) hours as needed for moderate pain or severe pain. Patient not taking: Reported on 08/14/2017 06/16/17   Domenic Moras, PA-C  ibuprofen (ADVIL,MOTRIN) 600 MG tablet Take 1 tablet (600 mg total) by mouth every 6 (six) hours as needed for mild pain or moderate pain. Patient not taking: Reported on 08/14/2017 06/16/17   Domenic Moras, PA-C  Multiple Vitamin (MULTI-DAY VITAMINS PO) Take 1 tablet by mouth daily.    [provider]  predniSONE (DELTASONE) 10 MG tablet Take 10 mg by mouth daily. 06/08/17   [provider]    Family History Family History  Problem Relation Age of Onset  . Aortic stenosis Son     Social History Social History   Tobacco Use  . Smoking status: Former Smoker    Packs/day: 0.50    Years: 12.00    Pack years: 6.00    Types: Cigarettes  . Smokeless tobacco: Never Used  . Tobacco comment: quit smoking age 60  Substance Use Topics  . Alcohol use: Yes    Comment: occasional wine  . Drug use: No     Allergies   Amoxicillin; Sulfa antibiotics; and Sulfamethoxazole   Review of Systems Review of Systems  Constitutional: Negative for chills and fever.  HENT: Negative for congestion and rhinorrhea.   Eyes: Negative for redness and visual disturbance.  Respiratory: Positive for cough. Negative for shortness of breath and wheezing.   Cardiovascular: Negative for chest pain and palpitations.  Gastrointestinal: Negative for abdominal pain, nausea and vomiting.  Genitourinary: Negative for dysuria and urgency.  Musculoskeletal: Negative for arthralgias and myalgias.  Skin: Negative for pallor and wound.  Neurological: Negative for dizziness and headaches.     Physical Exam Updated Vital Signs BP (!) 160/88 (BP Location: Right Arm)   Pulse 85   Temp 98.2 F (36.8 C) (Oral)   Resp 16    Ht 5\' 5"  (1.651 m)   Wt 56.7 kg (125 lb)   SpO2 94%   BMI 20.80 kg/m   Physical Exam  Constitutional: She is oriented to person, place, and time. She appears well-developed and well-nourished. No distress.  HENT:  Head: Normocephalic and atraumatic.  Eyes: Pupils are equal, round, and reactive to light. EOM are normal.  Neck: Normal range of motion. Neck supple.  Cardiovascular: Normal rate and regular rhythm. Exam reveals no gallop and no friction rub.  No murmur heard. Pulmonary/Chest: Effort normal. She has no wheezes. She has no rales.  Abdominal: Soft. She exhibits no distension. There is no tenderness.  Musculoskeletal: She exhibits no edema or tenderness.  Neurological: She is alert and oriented to person, place,  and time.  Skin: Skin is warm and dry. She is not diaphoretic.  Psychiatric: She has a normal mood and affect. Her behavior is normal.  Nursing note and vitals reviewed.    ED Treatments / Results  Labs (all labs ordered are listed, but only abnormal results are displayed) Labs Reviewed - No data to display  EKG None  Radiology Dg Chest 2 View  Result Date: 11/16/2017 CLINICAL DATA:  84 year old female status post fall while walking to bathroom. Was on the floor for over an hour. EXAM: CHEST - 2 VIEW COMPARISON:  Chest and rib series 09/11/2017 and earlier. FINDINGS: Semi upright AP and lateral views of the chest. Decreased left lung volume since January with patchy left lung base opacity. Healing left posterior 5th through 7th rib fractures. Stable cardiomegaly and mediastinal contours. Calcified aortic atherosclerosis. Visualized tracheal air column is within normal limits. No pneumothorax or pulmonary edema. No pleural effusion identified. The right lung remains clear. Negative visible bowel gas pattern. No new osseous abnormality identified. IMPRESSION: 1. Healing posterior left 5th through 7th rib fractures. 2. Decreased left lung volume since January with  patchy left lung base opacity which is probably atelectasis. Left lung base infection would be difficult to exclude. 3. Stable cardiomegaly.  Aortic Atherosclerosis (ICD10-I70.0). Electronically Signed   By: Genevie Ann M.D.   On: 11/16/2017 19:23    Procedures Procedures (including critical care time)  Medications Ordered in ED Medications - No data to display   Initial Impression / Assessment and Plan / ED Course  I have reviewed the triage vital signs and the nursing notes.  Pertinent labs & imaging results that were available during my care of the patient were reviewed by me and considered in my medical decision making (see chart for details).     84 yo F with a chief complaint of recurrent falls.  Patient had a recently treated for bronchitis about 5 weeks ago is now continuing to cough.  Chest x-ray here the radiologist is unable to rule out left lower lobe pneumonia.  I will start the patient on antibiotics.  Have her follow-up with her PCP.  7:50 PM:  I have discussed the diagnosis/risks/treatment options with the patient and family and believe the pt to be eligible for discharge home to follow-up with PCP. We also discussed returning to the ED immediately if new or worsening sx occur. We discussed the sx which are most concerning (e.g., sudden worsening pain, fever, inability to tolerate by mouth) that necessitate immediate return. Medications administered to the patient during their visit and any new prescriptions provided to the patient are listed below.  Medications given during this visit Medications - No data to display   The patient appears reasonably screen and/or stabilized for discharge and I doubt any other medical condition or other [facility] requiring further screening, evaluation, or treatment in the ED at this time prior to discharge.    Final Clinical Impressions(s) / ED Diagnoses   Final diagnoses:  Community acquired pneumonia of left lower lobe of lung [facility])    ED  Discharge Orders        Ordered    doxycycline (VIBRAMYCIN) 100 MG capsule  2 times daily     11/16/17 1927       Deno Etienne, DO 11/16/17 1950

## 2017-11-16 NOTE — ED Triage Notes (Signed)
Pt arrived via from [employer]. Per EMS pt had a fall while walking to the bathroom and lost her footing. Pt uses a walker during ambulation.  Pt reports that she was on the floor for over and hour. Pt denies LOC, and any pain or  injuries. Pt son reports  that she has had 3 falls since Feb, and wants pt to be evaluated. Pt is at baseline for orientation x3.  EMS v/s 186/70 HR 66, RR 18 CBG 134.

## 2017-11-16 NOTE — ED Notes (Signed)
Pt son is at bedside.

## 2017-11-16 NOTE — ED Notes (Signed)
Bed: WA21 Expected date:  Expected time:  Means of arrival:  Comments: fall 

## 2017-11-16 NOTE — ED Notes (Signed)
Pt is alert and oriented x 4 and is verbally responsive. Pt reports that she fell today in her bedroom, and she did call for help and no one could hear her. Pt denies any dizziness/

## 2017-11-17 ENCOUNTER — Encounter (HOSPITAL_COMMUNITY): Payer: Self-pay | Admitting: Emergency Medicine

## 2017-11-17 ENCOUNTER — Emergency Department (HOSPITAL_COMMUNITY): Payer: Medicare Other

## 2017-11-17 ENCOUNTER — Other Ambulatory Visit: Payer: Self-pay

## 2017-11-17 ENCOUNTER — Inpatient Hospital Stay (HOSPITAL_COMMUNITY)
Admission: EM | Admit: 2017-11-17 | Discharge: 2017-11-23 | DRG: 194 | Disposition: A | Discharge status: Skilled Nursing Facility | Payer: Medicare Other | Attending: Internal Medicine | Admitting: Internal Medicine

## 2017-11-17 DIAGNOSIS — I714 Abdominal aortic aneurysm, without rupture, unspecified: Secondary | ICD-10-CM | POA: Diagnosis present

## 2017-11-17 DIAGNOSIS — I10 Essential (primary) hypertension: Secondary | ICD-10-CM | POA: Diagnosis present

## 2017-11-17 DIAGNOSIS — I5032 Chronic diastolic (congestive) heart failure: Secondary | ICD-10-CM | POA: Diagnosis not present

## 2017-11-17 DIAGNOSIS — K219 Gastro-esophageal reflux disease without esophagitis: Secondary | ICD-10-CM | POA: Diagnosis present

## 2017-11-17 DIAGNOSIS — J189 Pneumonia, unspecified organism: Secondary | ICD-10-CM | POA: Diagnosis not present

## 2017-11-17 DIAGNOSIS — R030 Elevated blood-pressure reading, without diagnosis of hypertension: Secondary | ICD-10-CM | POA: Diagnosis not present

## 2017-11-17 DIAGNOSIS — Z87891 Personal history of nicotine dependence: Secondary | ICD-10-CM

## 2017-11-17 DIAGNOSIS — R296 Repeated falls: Secondary | ICD-10-CM

## 2017-11-17 DIAGNOSIS — I441 Atrioventricular block, second degree: Secondary | ICD-10-CM | POA: Diagnosis present

## 2017-11-17 DIAGNOSIS — Y95 Nosocomial condition: Secondary | ICD-10-CM | POA: Diagnosis present

## 2017-11-17 DIAGNOSIS — J181 Lobar pneumonia, unspecified organism: Secondary | ICD-10-CM | POA: Diagnosis not present

## 2017-11-17 DIAGNOSIS — I110 Hypertensive heart disease with heart failure: Secondary | ICD-10-CM | POA: Diagnosis present

## 2017-11-17 DIAGNOSIS — M25551 Pain in right hip: Secondary | ICD-10-CM | POA: Diagnosis not present

## 2017-11-17 DIAGNOSIS — I350 Nonrheumatic aortic (valve) stenosis: Secondary | ICD-10-CM | POA: Diagnosis not present

## 2017-11-17 DIAGNOSIS — Z66 Do not resuscitate: Secondary | ICD-10-CM | POA: Diagnosis present

## 2017-11-17 DIAGNOSIS — R05 Cough: Secondary | ICD-10-CM | POA: Diagnosis not present

## 2017-11-17 DIAGNOSIS — S79911A Unspecified injury of right hip, initial encounter: Secondary | ICD-10-CM | POA: Diagnosis not present

## 2017-11-17 DIAGNOSIS — Z96643 Presence of artificial hip joint, bilateral: Secondary | ICD-10-CM | POA: Diagnosis present

## 2017-11-17 DIAGNOSIS — R531 Weakness: Secondary | ICD-10-CM | POA: Diagnosis not present

## 2017-11-17 DIAGNOSIS — Z79899 Other long term (current) drug therapy: Secondary | ICD-10-CM

## 2017-11-17 HISTORY — DX: Essential (primary) hypertension: I10

## 2017-11-17 LAB — CBC WITH DIFFERENTIAL/PLATELET
BASOS ABS: 0 10*3/uL (ref 0.0–0.1)
BASOS PCT: 0 %
Eosinophils Absolute: 0 10*3/uL (ref 0.0–0.7)
Eosinophils Relative: 0 %
HCT: 37.3 % (ref 36.0–46.0)
Hemoglobin: 12 g/dL (ref 12.0–15.0)
Lymphocytes Relative: 10 %
Lymphs Abs: 1.1 10*3/uL (ref 0.7–4.0)
MCH: 29.7 pg (ref 26.0–34.0)
MCHC: 32.2 g/dL (ref 30.0–36.0)
MCV: 92.3 fL (ref 78.0–100.0)
MONO ABS: 1.3 10*3/uL — AB (ref 0.1–1.0)
Monocytes Relative: 12 %
Neutro Abs: 8.6 10*3/uL — ABNORMAL HIGH (ref 1.7–7.7)
Neutrophils Relative %: 78 %
Platelets: 237 10*3/uL (ref 150–400)
RBC: 4.04 MIL/uL (ref 3.87–5.11)
RDW: 14.6 % (ref 11.5–15.5)
WBC: 10.9 10*3/uL — ABNORMAL HIGH (ref 4.0–10.5)

## 2017-11-17 LAB — BASIC METABOLIC PANEL
ANION GAP: 13 (ref 5–15)
BUN: 16 mg/dL (ref 6–20)
CALCIUM: 8.5 mg/dL — AB (ref 8.9–10.3)
CO2: 21 mmol/L — ABNORMAL LOW (ref 22–32)
CREATININE: 0.89 mg/dL (ref 0.44–1.00)
Chloride: 101 mmol/L (ref 101–111)
GFR calc non Af Amer: 55 mL/min — ABNORMAL LOW (ref >60)
GLUCOSE: 109 mg/dL — AB (ref 65–99)
Potassium: 3.4 mmol/L — ABNORMAL LOW (ref 3.5–5.1)
Sodium: 135 mmol/L (ref 135–145)

## 2017-11-17 LAB — I-STAT TROPONIN, ED: Troponin i, poc: 0.05 ng/mL (ref 0.00–0.08)

## 2017-11-17 NOTE — ED Notes (Addendum)
Patient denies any pain at this time, states she tripped on her foot and fell.  Son who received a call from facility stated they told him she had a fever and high blood pressure.  Vitals stable here  Vitals:   11/17/17 2137 11/18/17 0030  BP: 131/74 129/88  Pulse: 86 88  Resp: 20 19  Temp: 98.1 F (36.7 C)   SpO2: 94% 98%

## 2017-11-17 NOTE — ED Triage Notes (Signed)
Pt BIB [facility] after an unwitnessed fall. Denies LOC, no obvious trauma, pt denies pain. Hx dementia, at baseline neuro status. EMS  Vitals: BP 190/108, HR 86, SpO2 96% room air.

## 2017-11-17 NOTE — ED Notes (Signed)
ED Provider at bedside. 

## 2017-11-17 NOTE — ED Provider Notes (Signed)
[facility] EMERGENCY DEPARTMENT Provider Note   CSN: [id number] Arrival date & time: 11/17/17  2124     History   Chief Complaint Chief Complaint  Patient presents with  . Fall    HPI Laura Knight is a 84 y.o. female.  The history is provided by the patient and medical records.   LEVEL V CAVEAT:  DEMENTIA 84 y.o. F with hx of arthritis, GERD, AAA, presenting to the ED after fall.  14 of history is provided by patient's son given her history of dementia.  States this is the fourth consecutive day that she has had a fall in her apartment at [facility] assisted living.  Yesterday she fell in the bathroom, today she fell in the living room.  She denies any injuries from her falls yesterday, states today she feels like her right hip is just "sore".  She did have a chest x-ray yesterday due to persistent cough which was concerning for a left lower lobe pneumonia.  Reports she continues to cough but denies any fever or chills.  States she has felt like her legs are a little weak lately like they may be "giving out on her".  States if she has someone to help her she feels a little more secure.  She does have a walker that she is supposed to use on a daily basis, she was using it today.  Her son reports that over the past several weeks she has had issues maneuvering with a walker, she will turn the walker but is not remembering to turn her feet to the side which causes her to fall over.  She has been receiving physical therapy a few times a week, however has refused to do it over the past 2 weeks as she has felt poorly.  Son is also concerned that she may be a little bit more confused from her baseline.  Past Medical History:  Diagnosis Date  . Acute bilateral low back pain without sciatica   . Aftercare following surgery of the musculoskeletal system   . Arthritis   . Contusion of left hip    initial encounter  . GERD (gastroesophageal reflux disease)   . History  of blood transfusion yrs ago  . Left hip pain   . Osteoarthritis of left hip    unspecified osteoarthritis type  . 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 Status post bilateral hip replacements   . Trochanteric bursitis, right hip     Patient Active Problem List   Diagnosis Date Noted  . Mobitz type 2 second degree AV block 08/14/2017  . AAA (abdominal aortic aneurysm) without rupture ([place]) 08/04/2017  . Aortic valve stenosis 08/04/2017  . Esophageal reflux 07/09/2013  . Constipation 06/18/2013  . Osteoarthritis of left hip S/P Left Total Hip Arthroplasty 06/18/2013  . Expected blood loss anemia 06/05/2013  . S/P left THA, AA 06/04/2013    Past Surgical History:  Procedure Laterality Date  . ABDOMINAL HYSTERECTOMY     1 ovary removed  . bilateral knee replacment  2008  . JOINT REPLACEMENT    . right hip replacement  1977  . TOTAL HIP ARTHROPLASTY Left 06/04/2013   Procedure: LEFT TOTAL HIP ARTHROPLASTY ANTERIOR APPROACH;  Surgeon: Mauri Pole, MD;  Location: [facility];  Service: Orthopedics;  Laterality: Left;     OB History   None      Home Medications    Prior to Admission medications   Medication  Sig Start Date End Date Taking? Authorizing Provider  acetaminophen (TYLENOL) 500 MG tablet Take 1,000 mg by mouth every 6 (six) hours as needed for mild pain or headache.    [provider]  benzonatate (TESSALON) 100 MG capsule Take 1 capsule (100 mg total) by mouth 2 (two) times daily. 08/15/17   Raiford Noble Latif, DO  cetirizine (ZYRTEC) 10 MG tablet Take 10 mg by mouth daily.    [provider]  Cholecalciferol (VITAMIN D PO) Take 1 tablet by mouth daily. 500 - 1000 MG ONCE DAILY    [provider]  docusate sodium (COLACE) 100 MG capsule Take 100 mg by mouth daily as needed for mild constipation.    [provider]  doxycycline (VIBRAMYCIN) 100 MG capsule Take 1 capsule (100 mg total) by mouth 2 (two)  times daily. One po bid x 7 days 11/16/17   Deno Etienne, DO  fluticasone [facility]) 50 MCG/ACT nasal spray Place 1 spray into both nostrils daily as needed for allergies or rhinitis.  03/22/17   [provider]  guaiFENesin (MUCINEX) 600 MG 12 hr tablet Take 2 tablets (1,200 mg total) by mouth 2 (two) times daily. 08/15/17   Raiford Noble Latif, DO  HYDROcodone-acetaminophen (NORCO/VICODIN) 5-325 MG tablet Take 1 tablet by mouth every 4 (four) hours as needed for moderate pain or severe pain. Patient not taking: Reported on 08/14/2017 06/16/17   Domenic Moras, PA-C  ibuprofen (ADVIL,MOTRIN) 600 MG tablet Take 1 tablet (600 mg total) by mouth every 6 (six) hours as needed for mild pain or moderate pain. Patient not taking: Reported on 08/14/2017 06/16/17   Domenic Moras, PA-C  Multiple Vitamin (MULTI-DAY VITAMINS PO) Take 1 tablet by mouth daily.    [provider]  predniSONE (DELTASONE) 10 MG tablet Take 10 mg by mouth daily. 06/08/17   [provider]    Family History Family History  Problem Relation Age of Onset  . Aortic stenosis Son     Social History Social History   Tobacco Use  . Smoking status: Former Smoker    Packs/day: 0.50    Years: 12.00    Pack years: 6.00    Types: Cigarettes  . Smokeless tobacco: Never Used  . Tobacco comment: quit smoking age 54  Substance Use Topics  . Alcohol use: Yes    Comment: occasional wine  . Drug use: No     Allergies   Amoxicillin; Sulfa antibiotics; and Sulfamethoxazole   Review of Systems Review of Systems  Unable to perform ROS: Dementia     Physical Exam Updated Vital Signs BP 131/74 (BP Location: Right Arm)   Pulse 86   Temp 98.1 F (36.7 C) (Oral)   Resp 20   SpO2 94%   Physical Exam  Constitutional: She is oriented to person, place, and time. She appears well-developed and well-nourished.  HENT:  Head: Normocephalic and atraumatic.  Mouth/Throat: Oropharynx is clear and moist.  Eyes:  Pupils are equal, round, and reactive to light. Conjunctivae and EOM are normal.  Neck: Normal range of motion.  Cardiovascular: Normal rate, regular rhythm and normal heart sounds.  Pulmonary/Chest: Effort normal and breath sounds normal. No stridor. No respiratory distress.  Wet cough, no acute distress, no significant rhonchi, rales, or wheezes, able to speak in full sentences without issue  Abdominal: Soft. Bowel sounds are normal. There is no tenderness. There is no rebound.  Musculoskeletal: Normal range of motion.  Endorses mild pain of right hip; no leg  shortening or gross deformity; no significant pain when moving the hip or leg  Neurological: She is alert and oriented to person, place, and time.  Skin: Skin is warm and dry.  Psychiatric: She has a normal mood and affect.  Nursing note and vitals reviewed.    ED Treatments / Results  Labs (all labs ordered are listed, but only abnormal results are displayed) Labs Reviewed  CBC WITH DIFFERENTIAL/PLATELET - Abnormal; Notable for the following components:      Result Value   WBC 10.9 (*)    Neutro Abs 8.6 (*)    Monocytes Absolute 1.3 (*)    All other components within normal limits  BASIC METABOLIC PANEL - Abnormal; Notable for the following components:   Potassium 3.4 (*)    CO2 21 (*)    Glucose, Bld 109 (*)    Calcium 8.5 (*)    GFR calc non Af Amer 55 (*)    All other components within normal limits  URINE CULTURE  URINALYSIS, ROUTINE W REFLEX MICROSCOPIC  I-STAT TROPONIN, ED    EKG None  Radiology Dg Chest 2 View  Result Date: 11/16/2017 CLINICAL DATA:  84 year old female status post fall while walking to bathroom. Was on the floor for over an hour. EXAM: CHEST - 2 VIEW COMPARISON:  Chest and rib series 09/11/2017 and earlier. FINDINGS: Semi upright AP and lateral views of the chest. Decreased left lung volume since January with patchy left lung base opacity. Healing left posterior 5th through 7th rib fractures.  Stable cardiomegaly and mediastinal contours. Calcified aortic atherosclerosis. Visualized tracheal air column is within normal limits. No pneumothorax or pulmonary edema. No pleural effusion identified. The right lung remains clear. Negative visible bowel gas pattern. No new osseous abnormality identified. IMPRESSION: 1. Healing posterior left 5th through 7th rib fractures. 2. Decreased left lung volume since January with patchy left lung base opacity which is probably atelectasis. Left lung base infection would be difficult to exclude. 3. Stable cardiomegaly.  Aortic Atherosclerosis (ICD10-I70.0). Electronically Signed   By: Genevie Ann M.D.   On: 11/16/2017 19:23    Procedures Procedures (including critical care time)  Medications Ordered in ED Medications - No data to display   Initial Impression / Assessment and Plan / ED Course  I have reviewed the triage vital signs and the nursing notes.  Pertinent labs & imaging results that were available during my care of the patient were reviewed by me and considered in my medical decision making (see chart for details).  84 year old female here with frequent falls.  Per patient's son this is her fourth fall in as many days.  Seen in the ED yesterday and had chest x-ray that showed possible left lower lobe pneumonia.  She was started on doxycycline.  Had another fall today.  Her only complaint is some mild right hip pain and there is no leg shortening or gross deformity.  Son also has some concern that she may be slightly more confused than normal.  She does not have any signs of head trauma, however given her questionable pneumonia on chest x-ray concerned that this may be sequela of ongoing infection.  We will plan for screening labs, EKG, x-ray of hip, and urinalysis.  Labs overall reassuring, white blood cell count 10.9.  X-ray of the hip without acute fracture.  She continues to have ill-defined opacity in the left lung base, questionable atelectasis  versus pneumonia.  She continues to have wet cough here, suspect this is likely  pneumonia.  Given her continued falls and questionable worsening confusion, will admit for ongoing care.  Will treat with vanc/cefepime as she already completed course of azithromycin.  Discussed with Dr. Hal Hope-- he will admit for ongoing care.  Final Clinical Impressions(s) / ED Diagnoses   Final diagnoses:  Community acquired pneumonia of left lower lobe of lung [facility])  Frequent falls    ED Discharge Orders    None       Larene Pickett, PA-C 11/18/17 0330    Tanna Furry, MD 11/20/17 2348

## 2017-11-18 ENCOUNTER — Other Ambulatory Visit: Payer: Self-pay

## 2017-11-18 ENCOUNTER — Encounter (HOSPITAL_COMMUNITY): Payer: Self-pay | Admitting: Internal Medicine

## 2017-11-18 ENCOUNTER — Observation Stay (HOSPITAL_COMMUNITY): Payer: Medicare Other

## 2017-11-18 DIAGNOSIS — R296 Repeated falls: Secondary | ICD-10-CM

## 2017-11-18 DIAGNOSIS — J189 Pneumonia, unspecified organism: Secondary | ICD-10-CM | POA: Diagnosis present

## 2017-11-18 DIAGNOSIS — R270 Ataxia, unspecified: Secondary | ICD-10-CM | POA: Diagnosis not present

## 2017-11-18 LAB — URINALYSIS, ROUTINE W REFLEX MICROSCOPIC
Bilirubin Urine: NEGATIVE
GLUCOSE, UA: NEGATIVE mg/dL
HGB URINE DIPSTICK: NEGATIVE
Ketones, ur: 20 mg/dL — AB
Leukocytes, UA: NEGATIVE
Nitrite: NEGATIVE
PH: 5 (ref 5.0–8.0)
Protein, ur: NEGATIVE mg/dL
SPECIFIC GRAVITY, URINE: 1.014 (ref 1.005–1.030)

## 2017-11-18 LAB — BASIC METABOLIC PANEL
ANION GAP: 12 (ref 5–15)
BUN: 15 mg/dL (ref 6–20)
CHLORIDE: 100 mmol/L — AB (ref 101–111)
CO2: 23 mmol/L (ref 22–32)
Calcium: 8.3 mg/dL — ABNORMAL LOW (ref 8.9–10.3)
Creatinine, Ser: 0.83 mg/dL (ref 0.44–1.00)
GFR calc Af Amer: >60 mL/min (ref >60)
GFR calc non Af Amer: >60 mL/min (ref >60)
Glucose, Bld: 111 mg/dL — ABNORMAL HIGH (ref 65–99)
Potassium: 3 mmol/L — ABNORMAL LOW (ref 3.5–5.1)
Sodium: 135 mmol/L (ref 135–145)

## 2017-11-18 LAB — CBC
HCT: 34.6 % — ABNORMAL LOW (ref 36.0–46.0)
HEMOGLOBIN: 11 g/dL — AB (ref 12.0–15.0)
MCH: 29.3 pg (ref 26.0–34.0)
MCHC: 31.8 g/dL (ref 30.0–36.0)
MCV: 92 fL (ref 78.0–100.0)
Platelets: 223 10*3/uL (ref 150–400)
RBC: 3.76 MIL/uL — AB (ref 3.87–5.11)
RDW: 14.6 % (ref 11.5–15.5)
WBC: 9.3 10*3/uL (ref 4.0–10.5)

## 2017-11-18 LAB — STREP PNEUMONIAE URINARY ANTIGEN: Strep Pneumo Urinary Antigen: NEGATIVE

## 2017-11-18 MED ORDER — ENOXAPARIN SODIUM 40 MG/0.4ML ~~LOC~~ SOLN
40.0000 mg | Freq: Every day | SUBCUTANEOUS | Status: DC
  Filled 2017-11-18: qty 0.4

## 2017-11-18 MED ORDER — VANCOMYCIN HCL IN DEXTROSE 750-5 MG/150ML-% IV SOLN
750.0000 mg | INTRAVENOUS | Status: DC
  Administered 2017-11-19: 750 mg via INTRAVENOUS
  Filled 2017-11-18: qty 150

## 2017-11-18 MED ORDER — DOCUSATE SODIUM 100 MG PO CAPS
100.0000 mg | ORAL_CAPSULE | Freq: Every day | ORAL | Status: DC | PRN
  Administered 2017-11-20: 100 mg via ORAL
  Filled 2017-11-18: qty 1

## 2017-11-18 MED ORDER — LORAZEPAM 0.5 MG PO TABS
0.5000 mg | ORAL_TABLET | ORAL | Status: DC | PRN
  Administered 2017-11-18 – 2017-11-20 (×4): 0.5 mg via ORAL
  Filled 2017-11-18 (×5): qty 1

## 2017-11-18 MED ORDER — SODIUM CHLORIDE 0.9 % IV SOLN
2.0000 g | Freq: Once | INTRAVENOUS | Status: AC
  Administered 2017-11-18: 2 g via INTRAVENOUS
  Filled 2017-11-18: qty 2

## 2017-11-18 MED ORDER — VANCOMYCIN HCL IN DEXTROSE 1-5 GM/200ML-% IV SOLN
1000.0000 mg | Freq: Once | INTRAVENOUS | Status: AC
  Administered 2017-11-18: 1000 mg via INTRAVENOUS
  Filled 2017-11-18: qty 200

## 2017-11-18 MED ORDER — ONDANSETRON HCL 4 MG/2ML IJ SOLN: 4.0000 mg | Freq: Four times a day (QID) | INTRAMUSCULAR | Status: DC | PRN

## 2017-11-18 MED ORDER — LORATADINE 10 MG PO TABS: 10.0000 mg | ORAL_TABLET | Freq: Every day | ORAL | Status: DC | PRN

## 2017-11-18 MED ORDER — ONDANSETRON HCL 4 MG PO TABS: 4.0000 mg | ORAL_TABLET | Freq: Four times a day (QID) | ORAL | Status: DC | PRN

## 2017-11-18 MED ORDER — ACETAMINOPHEN 650 MG RE SUPP: 650.0000 mg | Freq: Four times a day (QID) | RECTAL | Status: DC | PRN

## 2017-11-18 MED ORDER — ENOXAPARIN SODIUM 40 MG/0.4ML ~~LOC~~ SOLN
40.0000 mg | SUBCUTANEOUS | Status: DC
  Administered 2017-11-18: 40 mg via SUBCUTANEOUS
  Filled 2017-11-18 (×2): qty 0.4

## 2017-11-18 MED ORDER — GUAIFENESIN-CODEINE 100-10 MG/5ML PO SOLN
10.0000 mL | Freq: Two times a day (BID) | ORAL | Status: DC | PRN
  Administered 2017-11-19 – 2017-11-22 (×3): 10 mL via ORAL
  Filled 2017-11-18 (×3): qty 10

## 2017-11-18 MED ORDER — ACETAMINOPHEN 325 MG PO TABS: 650.0000 mg | ORAL_TABLET | Freq: Four times a day (QID) | ORAL | Status: DC | PRN

## 2017-11-18 MED ORDER — PREDNISONE 10 MG PO TABS
10.0000 mg | ORAL_TABLET | Freq: Every day | ORAL | Status: DC
  Administered 2017-11-19 – 2017-11-23 (×5): 10 mg via ORAL
  Filled 2017-11-18 (×6): qty 1

## 2017-11-18 MED ORDER — ENOXAPARIN SODIUM 40 MG/0.4ML ~~LOC~~ SOLN: 40.0000 mg | Freq: Every day | SUBCUTANEOUS | Status: DC

## 2017-11-18 MED ORDER — MELATONIN 3 MG PO TABS
3.0000 mg | ORAL_TABLET | Freq: Every evening | ORAL | Status: DC | PRN
  Administered 2017-11-20: 3 mg via ORAL
  Filled 2017-11-18 (×3): qty 1

## 2017-11-18 MED ORDER — SODIUM CHLORIDE 0.9 % IV SOLN
1.0000 g | INTRAVENOUS | Status: DC
  Administered 2017-11-18: 1 g via INTRAVENOUS
  Filled 2017-11-18 (×3): qty 1

## 2017-11-18 MED ORDER — LORAZEPAM 1 MG PO TABS
0.5000 mg | ORAL_TABLET | Freq: Once | ORAL | Status: AC
Start: 2017-11-18 — End: 2017-11-18
  Administered 2017-11-18: 0.5 mg via ORAL
  Filled 2017-11-18: qty 1

## 2017-11-18 NOTE — ED Notes (Signed)
Pt placed on purewick at this time, resting comfortably

## 2017-11-18 NOTE — Progress Notes (Signed)
Received message from central telemetry that patient was going in and out of second degree heart block type 2, missed beats, and pauses. Dr. Jonelle Sidle made aware. Cardiology consulted. Not symptomatic at this time. Will continue to monitor. Bartholomew Crews, RN

## 2017-11-18 NOTE — ED Notes (Signed)
Patient becoming very agitated and restless, PA Lattie Haw aware

## 2017-11-18 NOTE — ED Notes (Signed)
Heart healthy breakfast tray ordered 

## 2017-11-18 NOTE — Progress Notes (Signed)
New Admission Note:   Arrival Method: on hospital bed from ED [place] and oreinted to self Telemetry:  Assessment: completed Skin: assessed by 2 RNs IV: L forearm NSL Pain: intermittent pain mid abdominal with coughing Tubes: external urinary catheter Safety Measures:  Admission: completed with family 79M Orientation: done Family: family at bedside Personal Belongings:family to take patient's ring home  Orders have been reviewed and implemented. Will continue to monitor the patient. Call light has been placed within reach and bed alarm has been activated.   Manya Silvas, RN MSN [place] Phone number: [phone]

## 2017-11-18 NOTE — Evaluation (Signed)
Physical Therapy Evaluation Patient Details Name: Laura Knight MRN: [id number] DOB: September 09, 1924 Today's Date: 11/18/2017   History of Present Illness  Olinda Nola is a 84 y.o. female with history of abdominal aortic aneurysm brought to the ER because of recurrent falls.  This is the fourth visit to the ER in the last 1 week.  During the last visit patient was found to have pneumonia and was discharged on antibiotics.  Despite taking which patient was still having persistent cough with shortness of breath.  As per the patient's son patient has had been having falls but not sure if she lost consciousness.  Did not hit her head.  Denies any chest pain.  Patient was admitted in December 2018 4 months ago for second-degree AV block at the time patient refused pacemaker.  Clinical Impression  Pt admitted with above diagnosis. Pt currently with functional limitations due to the deficits listed below (see PT Problem List). Pt pleasantly confused on eval but family reports that she has been refusing PT and can get very agitated at [facility]. Mod A +2 needed for bed mobility and transfer to chair. Pt unable to step feet to ambulate. Decreased proprioceptive awareness and decreased following commands. Would benefit from neuro eval once cardiac clears.  Pt will benefit from skilled PT to increase their independence and safety with mobility to allow discharge to the venue listed below.      Follow Up Recommendations SNF;Supervision/Assistance - 24 hour (may go back to [facility] if they can offer current LOC in which case, recommend ALF)    Equipment Recommendations  None recommended by PT    Recommendations for Other Services Other (comment);OT consult(neuro consult)     Precautions / Restrictions Precautions Precautions: Fall Precaution Comments: has fallen numerous times in past week Restrictions Weight Bearing Restrictions: No      Mobility  Bed Mobility Overal bed mobility: Needs  Assistance Bed Mobility: Supine to Sit     Supine to sit: Mod assist;+2 for physical assistance     General bed mobility comments: mod A for LE's off bed and elevation of trunk. Pt sitting with trunk very flexed, looking at floor. She can be manually corrected but does not correct with vc's  Transfers Overall transfer level: Needs assistance Equipment used: Rolling walker (2 wheeled);2 person hand held assist Transfers: Sit to/from [facility] Sit to Stand: Min assist;+2 physical assistance Stand pivot transfers: Mod assist;+2 physical assistance       General transfer comment: pt able to step each foot 2x but after that was not following commands to step feet. When she stood to RW her hips were flexed at almost 90 degrees and pt was unable to correct. Arm in arm transfer for pivot to chair to help with back extension.   Ambulation/Gait             General Gait Details: unable to step feet, only pivoted to chair  Stairs            Wheelchair Mobility    Modified Rankin (Stroke Patients Only) Modified Rankin (Stroke Patients Only) Pre-Morbid Rankin Score: Moderately severe disability Modified Rankin: Severe disability     Balance Overall balance assessment: Needs assistance Sitting-balance support: Single extremity supported Sitting balance-Leahy Scale: Poor Sitting balance - Comments: needed min A to maintain sitting due to flexed posture   Standing balance support: Bilateral upper extremity supported Standing balance-Leahy Scale: Poor Standing balance comment: UE support needed as well as mod A to  maintain standing                             Pertinent Vitals/Pain Pain Assessment: Faces Faces Pain Scale: Hurts little more Pain Location: back Pain Descriptors / Indicators: Aching;Sore Pain Intervention(s): Limited activity within patient's tolerance;Monitored during session    [place] expects to be  discharged to:: Assisted living               Home Equipment: Walker - 2 wheels Additional Comments: pt moved to ALF [facility] several months ago. Daughter in law reports that she has not been working well with PT and has been falling more and more    Prior Function Level of Independence: Needs assistance   Gait / Transfers Assistance Needed: ambulates with Rollator within assisted living  ADL's / Homemaking Assistance Needed: eats in dining room  Comments: has been using a RW and learning to use rollator. At [place] she has 24 hr caregivers and could get up when she wanted. Now she's at ALF and is supposed to ask for help but she does not remember to do this     Hand Dominance   Dominant Hand: Right    Extremity/Trunk Assessment   Upper Extremity Assessment Upper Extremity Assessment: Generalized weakness    Lower Extremity Assessment Lower Extremity Assessment: Generalized weakness    Cervical / Trunk Assessment Cervical / Trunk Assessment: Kyphotic  Communication   Communication: No difficulties  Cognition Arousal/Alertness: Awake/alert Behavior During Therapy: Flat affect Overall Cognitive Status: History of cognitive impairments - at baseline                                 General Comments: pt agreeable with therapy today but family reports that she can be very stubborn      General Comments General comments (skin integrity, edema, etc.): daughter in law reports that current posture and mobility state is new for her over past week    Exercises     Assessment/Plan    PT Assessment Patient needs continued PT services  PT Problem List Decreased strength;Decreased activity tolerance;Decreased balance;Decreased mobility;Decreased cognition;Decreased knowledge of use of DME;Decreased safety awareness;Decreased knowledge of precautions;Pain       PT Treatment Interventions DME instruction;Gait training;Functional mobility  training;Therapeutic activities;Therapeutic exercise;Balance training;Neuromuscular re-education;Cognitive remediation;Patient/family education    PT Goals (Current goals can be found in the Care Plan section)  Acute Rehab PT Goals Patient Stated Goal: none stated, family wants her to be able to go back to [facility] to decrease change for her with her mental status PT Goal Formulation: With patient Time For Goal Achievement: 12/02/17 Potential to Achieve Goals: Fair    Frequency Min 2X/week   Barriers to discharge Decreased caregiver support unsure how much assistance [facility] can offer    Co-evaluation               AM-PAC PT "6 Clicks" Daily Activity  Outcome Measure Difficulty turning over in bed (including adjusting bedclothes, sheets and blankets)?: Unable Difficulty moving from lying on back to sitting on the side of the bed? : Unable Difficulty sitting down on and standing up from a chair with arms (e.g., wheelchair, bedside commode, etc,.)?: Unable Help needed moving to and from a bed to chair (including a wheelchair)?: A Lot Help needed walking in hospital room?: Total Help needed climbing 3-5 steps with a  railing? : Total 6 Click Score: 7    End of Session Equipment Utilized During Treatment: Gait belt Activity Tolerance: Patient tolerated treatment well Patient left: in chair;with chair alarm set;with call bell/phone within reach;with family/visitor present Nurse Communication: Mobility status PT Visit Diagnosis: Unsteadiness on feet (R26.81);Muscle weakness (generalized) (M62.81);Repeated falls (R29.6);Difficulty in walking, not elsewhere classified (R26.2);Pain Pain - part of body: (back)    Time: 4712-5271 PT Time Calculation (min) (ACUTE ONLY): 28 min   Charges:   PT Evaluation $PT Eval Moderate Complexity: 1 Mod PT Treatments $Therapeutic Activity: 8-22 mins   PT G Codes:        Leighton Roach, PT  Acute Rehab Services   [place] 11/18/2017, 4:21 PM

## 2017-11-18 NOTE — Progress Notes (Signed)
Pharmacy Antibiotic Note  Laura Knight is a 84 y.o. female admitted on 11/17/2017 with pneumonia.  Pharmacy has been consulted for Vancomycin/Cefepime dosing. Pt here for fall. WBC 10.9. CXR with possible PNA. Renal function age appropriate.   Plan: Vancomycin 750 mg IV q24h Cefepime 1g IV q24h Trend WBC, temp, renal function  F/U infectious work-up Drug levels as indicated  Temp (24hrs), Avg:98.1 F (36.7 C), Min:98.1 F (36.7 C), Max:98.1 F (36.7 C)  Recent Labs  Lab 11/17/17 2228  WBC 10.9*  CREATININE 0.89    Estimated Creatinine Clearance: 37.6 mL/min (by C-G formula based on SCr of 0.89 mg/dL).    Allergies  Allergen Reactions  . Amoxicillin Other (See Comments)    Unknown reaction Has patient had a PCN reaction causing immediate rash, facial/tongue/throat swelling, SOB or lightheadedness with hypotension: No Has patient had a PCN reaction causing severe rash involving mucus membranes or skin necrosis: Unknown Has patient had a PCN reaction that required hospitalization: Unknown Has patient had a PCN reaction occurring within the last 10 years: Unknown If all of the above answers are "NO", then may proceed with Cephalosporin use.   . Sulfa Antibiotics Other (See Comments)    Unknown reaction  . Sulfamethoxazole Rash and Hives    Narda Bonds 11/18/2017 1:29 AM

## 2017-11-18 NOTE — Progress Notes (Signed)
Messaged oncall [place] regarding pt bradying down to high 30s and going into 2nd degree heartblock per CCMD. Pt is asleep and asymptomatic. Pt went back up into 70s. Will continue to monitor.   Eleanora Neighbor, RN

## 2017-11-18 NOTE — ED Notes (Signed)
Patient resting in bed comfortably, denies any needs at this time.  Unable to provide a urine sample at this time.  Due to hallway bed not feasible to preform in and out at this time.  Patient and family updated on plan of care for admission to the hospital.  Currently waiting on admitting provider to see patient.    Vitals:   11/17/17 2137  BP: 131/74  Pulse: 86  Resp: 20  Temp: 98.1 F (36.7 C)  SpO2: 94%

## 2017-11-18 NOTE — ED Notes (Signed)
Heart healthy meal tray ordered for pt at this time 

## 2017-11-18 NOTE — ED Notes (Signed)
Pt placed on bedpan for urine sample, soiled brief removed

## 2017-11-18 NOTE — ED Notes (Signed)
Pt not able to urinate in bedpan, removed from bedpan and pt immediately urinated on sheets. Pt cleaned, unable to collect urine sample at this time

## 2017-11-18 NOTE — H&P (Signed)
History and Physical    Laura Knight GYF:[id number] DOB: [birth year] DOA: 11/17/2017  PCP: Leeroy Cha, MD  Patient coming from: Assisted living facility.  Chief Complaint: Falls.  Cough.  HPI: Laura Knight is a 84 y.o. female with history of abdominal aortic aneurysm brought to the ER because of recurrent falls.  This is the fourth visit to the ER in the last 1 week.  During the last visit patient was found to have pneumonia and was discharged on antibiotics.  Despite taking which patient was still having persistent cough with shortness of breath.  As per the patient's son patient has had been having falls but not sure if she lost consciousness.  Did not hit her head.  Denies any chest pain.  Patient was admitted in December 2018 4 months ago for second-degree AV block at the time patient refused pacemaker.  ED Course: In the ER patient is found to have persistent cough EKG was showing LBBB with bigeminy.  Chest x-ray shows definite opacity.  Since patient has been having recurrent falls with worsening cough patient admitted for treatment of pneumonia and further observation.  Review of Systems: As per HPI, rest all negative.   Past Medical History:  Diagnosis Date  . Acute bilateral low back pain without sciatica   . Aftercare following surgery of the musculoskeletal system   . Arthritis   . Contusion of left hip    initial encounter  . GERD (gastroesophageal reflux disease)   . History of blood transfusion yrs ago  . Left hip pain   . Osteoarthritis of left hip    unspecified osteoarthritis type  . 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 Status post bilateral hip replacements   . Trochanteric bursitis, right hip     Past Surgical History:  Procedure Laterality Date  . ABDOMINAL HYSTERECTOMY     1 ovary removed  . bilateral knee replacment  2008  . JOINT REPLACEMENT    . right hip replacement  1977  . TOTAL HIP ARTHROPLASTY Left  06/04/2013   Procedure: LEFT TOTAL HIP ARTHROPLASTY ANTERIOR APPROACH;  Surgeon: Mauri Pole, MD;  Location: [facility];  Service: Orthopedics;  Laterality: Left;     reports that she has quit smoking. Her smoking use included cigarettes. She has a 6.00 pack-year smoking history. She has never used smokeless tobacco. She reports that she drinks alcohol. She reports that she does not use drugs.  Allergies  Allergen Reactions  . Amoxicillin Other (See Comments)    Unknown reaction Has patient had a PCN reaction causing immediate rash, facial/tongue/throat swelling, SOB or lightheadedness with hypotension: No Has patient had a PCN reaction causing severe rash involving mucus membranes or skin necrosis: Unknown Has patient had a PCN reaction that required hospitalization: Unknown Has patient had a PCN reaction occurring within the last 10 years: Unknown If all of the above answers are "NO", then may proceed with Cephalosporin use.   . Sulfa Antibiotics Other (See Comments)    Unknown reaction  . Sulfamethoxazole Rash and Hives    Family History  Problem Relation Age of Onset  . Aortic stenosis Son     Prior to Admission medications   Medication Sig Start Date End Date Taking? Authorizing Provider  acetaminophen (TYLENOL) 325 MG tablet Take 325 mg by mouth every 4 (four) hours as needed for mild pain, fever or headache.   Yes [provider]  diphenhydramine-acetaminophen (TYLENOL PM) 25-500 MG TABS tablet Take  1 tablet by mouth at bedtime.   Yes [provider]  docusate sodium (COLACE) 100 MG capsule Take 100 mg by mouth daily as needed for mild constipation.   Yes [provider]  doxycycline (VIBRAMYCIN) 100 MG capsule Take 1 capsule (100 mg total) by mouth 2 (two) times daily. One po bid x 7 days 11/16/17  Yes Deno Etienne, DO  guaiFENesin (MUCINEX) 600 MG 12 hr tablet Take 2 tablets (1,200 mg total) by mouth 2 (two) times daily. Patient taking differently:  Take 1,200 mg by mouth 2 (two) times daily as needed for to loosen phlegm.  08/15/17  Yes Sheikh, Omair Latif, DO  guaiFENesin-codeine (ROBITUSSIN AC) 100-10 MG/5ML syrup Take 10 mLs by mouth 2 (two) times daily as needed for cough.   Yes [provider]  loratadine (CLARITIN) 10 MG tablet Take 10 mg by mouth daily as needed for allergies.   Yes [provider]  Melatonin 3 MG TABS Take 3 mg by mouth at bedtime as needed (sleep).   Yes [provider]  Multiple Vitamin (MULTI-DAY VITAMINS PO) Take 1 tablet by mouth daily.   Yes [provider]  predniSONE (DELTASONE) 10 MG tablet Take 7.5 mg by mouth daily.  06/08/17  Yes [provider]  Vitamin D, Ergocalciferol, (DRISDOL) 50000 units CAPS capsule Take 50,000 Units by mouth every 7 (seven) days.   Yes [provider]    Physical Exam: Vitals:   11/17/17 2137 11/18/17 0030  BP: 131/74 129/88  Pulse: 86 88  Resp: 20 19  Temp: 98.1 F (36.7 C)   TempSrc: Oral   SpO2: 94% 98%      Constitutional: Moderately built and nourished. Vitals:   11/17/17 2137 11/18/17 0030  BP: 131/74 129/88  Pulse: 86 88  Resp: 20 19  Temp: 98.1 F (36.7 C)   TempSrc: Oral   SpO2: 94% 98%   Eyes: Anicteric no pallor. ENMT: No discharge from the ears eyes nose or mouth. Neck: No mass felt.  No neck rigidity. Respiratory: No rhonchi or crepitations. Cardiovascular: S1-S2 heard no murmurs appreciated. Abdomen: Soft nontender bowel sounds present. Musculoskeletal: No edema.  No joint effusion. Skin: No rash.  Skin appears warm. Neurologic: Alert awake oriented to name and knows that she is in the hospital.  Moves all extremities. Psychiatric: Has mild memory issues.   Labs on Admission: I have personally reviewed following labs and imaging studies  CBC: Recent Labs  Lab 11/17/17 2228  WBC 10.9*  NEUTROABS 8.6*  HGB 12.0  HCT 37.3  MCV 92.3  PLT 782   Basic Metabolic Panel: Recent  Labs  Lab 11/17/17 2228  NA 135  K 3.4*  CL 101  CO2 21*  GLUCOSE 109*  BUN 16  CREATININE 0.89  CALCIUM 8.5*   GFR: Estimated Creatinine Clearance: 37.6 mL/min (by C-G formula based on SCr of 0.89 mg/dL). Liver Function Tests: No results for input(s): AST, ALT, ALKPHOS, BILITOT, PROT, ALBUMIN in the last 168 hours. No results for input(s): LIPASE, AMYLASE in the last 168 hours. No results for input(s): AMMONIA in the last 168 hours. Coagulation Profile: No results for input(s): INR, PROTIME in the last 168 hours. Cardiac Enzymes: No results for input(s): CKTOTAL, CKMB, CKMBINDEX, TROPONINI in the last 168 hours. BNP (last 3 results) No results for input(s): PROBNP in the last 8760 hours. HbA1C: No results for input(s): HGBA1C in the last 72 hours. CBG: No results for input(s): GLUCAP in the last 168 hours.  Lipid Profile: No results for input(s): CHOL, HDL, LDLCALC, TRIG, CHOLHDL, LDLDIRECT in the last 72 hours. Thyroid Function Tests: No results for input(s): TSH, T4TOTAL, FREET4, T3FREE, THYROIDAB in the last 72 hours. Anemia Panel: No results for input(s): VITAMINB12, FOLATE, FERRITIN, TIBC, IRON, RETICCTPCT in the last 72 hours. Urine analysis:    Component Value Date/Time   COLORURINE YELLOW 06/16/2017 [place] 06/16/2017 1635   LABSPEC 1.013 06/16/2017 1635   PHURINE 6.0 06/16/2017 1635   GLUCOSEU NEGATIVE 06/16/2017 1635   HGBUR NEGATIVE 06/16/2017 1635   BILIRUBINUR NEGATIVE 06/16/2017 1635   KETONESUR NEGATIVE 06/16/2017 1635   PROTEINUR NEGATIVE 06/16/2017 1635   UROBILINOGEN 0.2 08/10/2014 0926   NITRITE NEGATIVE 06/16/2017 1635   LEUKOCYTESUR TRACE (A) 06/16/2017 1635   Sepsis Labs: @LABRCNTIP (procalcitonin:4,lacticidven:4) )No results found for this or any previous visit (from the past 240 hour(s)).   Radiological Exams on Admission: Dg Chest 2 View  Result Date: 11/17/2017 CLINICAL DATA:  84 y/o  F; unwitnessed fall.  Cough.  EXAM: CHEST - 2 VIEW COMPARISON:  11/16/2017 chest radiograph. FINDINGS: Stable cardiomegaly given projection and technique. Aortic atherosclerosis with calcification. Stable ill-defined opacification of left lung base. No pleural effusion or pneumothorax. Healing left 5-7 rib fractures with callus. No new displaced rib fracture identified. IMPRESSION: 1. Stable ill-defined opacity in left lung base which may represent atelectasis or pneumonia. 2. Stable healing left 5-7 rib fractures. No acute fracture identified. 3. Stable cardiomegaly and aortic atherosclerosis. Electronically Signed   By: Kristine Garbe M.D.   On: 11/17/2017 23:41   Dg Chest 2 View  Result Date: 11/16/2017 CLINICAL DATA:  84 year old female status post fall while walking to bathroom. Was on the floor for over an hour. EXAM: CHEST - 2 VIEW COMPARISON:  Chest and rib series 09/11/2017 and earlier. FINDINGS: Semi upright AP and lateral views of the chest. Decreased left lung volume since January with patchy left lung base opacity. Healing left posterior 5th through 7th rib fractures. Stable cardiomegaly and mediastinal contours. Calcified aortic atherosclerosis. Visualized tracheal air column is within normal limits. No pneumothorax or pulmonary edema. No pleural effusion identified. The right lung remains clear. Negative visible bowel gas pattern. No new osseous abnormality identified. IMPRESSION: 1. Healing posterior left 5th through 7th rib fractures. 2. Decreased left lung volume since January with patchy left lung base opacity which is probably atelectasis. Left lung base infection would be difficult to exclude. 3. Stable cardiomegaly.  Aortic Atherosclerosis (ICD10-I70.0). Electronically Signed   By: Genevie Ann M.D.   On: 11/16/2017 19:23   Dg Hip Unilat W Or Wo Pelvis 2-3 Views Right  Result Date: 11/17/2017 CLINICAL DATA:  Post unwitnessed fall. EXAM: DG HIP (WITH OR WITHOUT PELVIS) 2-3V RIGHT COMPARISON:  06/16/2017  FINDINGS: Post bilateral total hip arthroplasty. There is no evidence of fracture. The orthopedic components are normally aligned. Healing or healed bilateral superior pubic ramus fractures. IMPRESSION: No evidence of acute fracture. Healing or healed bilateral superior pubic rami fractures. Electronically Signed   By: Fidela Salisbury M.D.   On: 11/17/2017 23:41    EKG: Independently reviewed.  Normal sinus rhythm with bigeminy and LBBB.  Patient has known history of chronic LBBB.  Assessment/Plan Principal Problem:   HCAP (healthcare-associated pneumonia) Active Problems:   AAA (abdominal aortic aneurysm) without rupture (HCC)   Aortic valve stenosis    1. Healthcare associated pneumonia -patient has been placed on vancomycin and cefepime.  Check sputum cultures.  Check urine for Legionella  and strep antigen. 2. Falls with history of Mobitz type II second-degree AV block -we will monitor in telemetry for any further episodes of significant bradycardia.  Get physical therapy consult. 3. History of abdominal aortic aneurysm and thoracic aortic aneurysm -denies any pain. 4. History of mild aortic stenosis per 2D echo done in 2018 December. 5. Diastolic dysfunction per 2D echo done in 2018 December which showed EF of 65-70%.  Appears compensated.  CT of the head is pending.   DVT prophylaxis: Lovenox. Code Status: DNR. Family Communication: Patient's son. Disposition Plan: To be determined. Consults called: Physical therapy. Admission status: Observation.   Rise Patience MD [facility] Pager [phone].  If 7PM-7AM, please contact night-coverage [email or web address] Password [facility]  11/18/2017, 3:53 AM

## 2017-11-18 NOTE — ED Notes (Signed)
Son bedside, requesting pt be transferred to [facility] because "this wait is unnecessary, this whole hospital is ridiculous". MD made aware and is getting in touch with staff at [facility]

## 2017-11-19 DIAGNOSIS — J189 Pneumonia, unspecified organism: Secondary | ICD-10-CM

## 2017-11-19 LAB — BASIC METABOLIC PANEL
Anion gap: 12 (ref 5–15)
BUN: 22 mg/dL — AB (ref 6–20)
CHLORIDE: 99 mmol/L — AB (ref 101–111)
CO2: 23 mmol/L (ref 22–32)
CREATININE: 0.89 mg/dL (ref 0.44–1.00)
Calcium: 8.5 mg/dL — ABNORMAL LOW (ref 8.9–10.3)
GFR calc Af Amer: >60 mL/min (ref >60)
GFR calc non Af Amer: 55 mL/min — ABNORMAL LOW (ref >60)
Glucose, Bld: 123 mg/dL — ABNORMAL HIGH (ref 65–99)
Potassium: 3.6 mmol/L (ref 3.5–5.1)
Sodium: 134 mmol/L — ABNORMAL LOW (ref 135–145)

## 2017-11-19 LAB — URINE CULTURE: CULTURE: NO GROWTH

## 2017-11-19 LAB — LEGIONELLA PNEUMOPHILA SEROGP 1 UR AG: L. PNEUMOPHILA SEROGP 1 UR AG: NEGATIVE

## 2017-11-19 LAB — MRSA PCR SCREENING: MRSA by PCR: NEGATIVE

## 2017-11-19 NOTE — NC FL2 (Signed)
[place] LEVEL OF CARE SCREENING TOOL     IDENTIFICATION  Patient Name: Laura Knight Birthdate: [birth year] Sex: female Admission Date (Current Location): 11/17/2017  [facility] and [place] Number:  Herbalist and Address:  [facility], [place] [address] [place], [place] [address]      Provider Number: [id number]  Attending Physician Name and Address:  Elwyn Reach, MD  Relative Name and Phone Number:       Current Level of Care: Hospital Recommended Level of Care: [place] Prior Approval Number:    Date Approved/Denied:   PASRR Number:    Discharge Plan: SNF    Current Diagnoses: Patient Active Problem List   Diagnosis Date Noted  . HCAP (healthcare-associated pneumonia) 11/18/2017  . Frequent falls   . Mobitz type 2 second degree AV block 08/14/2017  . AAA (abdominal aortic aneurysm) without rupture ([place]) 08/04/2017  . Aortic valve stenosis 08/04/2017  . Esophageal reflux 07/09/2013  . Constipation 06/18/2013  . Osteoarthritis of left hip S/P Left Total Hip Arthroplasty 06/18/2013  . Expected blood loss anemia 06/05/2013  . S/P left THA, AA 06/04/2013    Orientation RESPIRATION BLADDER Height & Weight     Self, Place  Normal Continent Weight: 129 lb (58.5 kg) Height:  5\' 5"  (165.1 cm)  BEHAVIORAL SYMPTOMS/MOOD NEUROLOGICAL BOWEL NUTRITION STATUS      Continent Diet(Heart Healthy, thin liquids)  AMBULATORY STATUS COMMUNICATION OF NEEDS Skin   Extensive Assist Verbally Normal                       Personal Care Assistance Level of Assistance  Feeding, Dressing, Bathing Bathing Assistance: Maximum assistance Feeding assistance: Independent Dressing Assistance: Maximum assistance     Functional Limitations Info  Sight, Hearing, Speech Sight Info: Adequate Hearing Info: Adequate Speech Info: Adequate    SPECIAL CARE FACTORS FREQUENCY  PT (By licensed PT), OT (By licensed OT)      PT Frequency: 2x OT Frequency: 2x            Contractures Contractures Info: Not present    Additional Factors Info  Code Status, Allergies Code Status Info: DNR Allergies Info: Amoxicillin, Sulfa Antibiotics, Sulfamethoxazole           Current Medications (11/19/2017):  This is the current hospital active medication list Current Facility-Administered Medications  Medication Dose Route Frequency Provider Last Rate Last Dose  . acetaminophen (TYLENOL) tablet 650 mg  650 mg Oral Q6H PRN Rise Patience, MD       Or  . acetaminophen (TYLENOL) suppository 650 mg  650 mg Rectal Q6H PRN Rise Patience, MD      . ceFEPIme (MAXIPIME) 1 g in sodium chloride 0.9 % 100 mL IVPB  1 g Intravenous Q24H Rise Patience, MD   Stopped at 11/18/17 2356  . docusate sodium (COLACE) capsule 100 mg  100 mg Oral Daily PRN Rise Patience, MD      . enoxaparin (LOVENOX) injection 40 mg  40 mg Subcutaneous Q24H Rise Patience, MD   40 mg at 11/18/17 2326  . guaiFENesin-codeine 100-10 MG/5ML solution 10 mL  10 mL Oral BID PRN Rise Patience, MD   10 mL at 11/19/17 1009  . loratadine (CLARITIN) tablet 10 mg  10 mg Oral Daily PRN Rise Patience, MD      . LORazepam (ATIVAN) tablet 0.5 mg  0.5 mg Oral Q4H PRN Jonelle Sidle,  Mohammad L, MD   0.5 mg at 11/18/17 1049  . Melatonin TABS 3 mg  3 mg Oral QHS PRN Rise Patience, MD      . ondansetron [facility]) tablet 4 mg  4 mg Oral Q6H PRN Rise Patience, MD       Or  . ondansetron [facility]) injection 4 mg  4 mg Intravenous Q6H PRN Rise Patience, MD      . predniSONE (DELTASONE) tablet 10 mg  10 mg Oral Q breakfast Elwyn Reach, MD   10 mg at 11/19/17 1010     Discharge Medications: Please see discharge summary for a list of discharge medications.  Relevant Imaging Results:  Relevant Lab Results:   Additional Information SSN: [id number]  Eileen Stanford, LCSW

## 2017-11-19 NOTE — Progress Notes (Signed)
Patient ID: Laura Knight, female   DOB: 07/30/1925, 84 y.o.   MRN: [id number]  PROGRESS NOTE    Laura Knight  AST:[id number] DOB: May 02, 1925 DOA: 11/17/2017 PCP: Leeroy Cha, MD   Outpatient Specialists: None   Brief Narrative: Laura Knight is a 84 y.o. female with history of abdominal aortic aneurysm brought to the ER because of recurrent falls.  This is the fourth visit to the ER in the last 1 week.  During the last visit patient was found to have pneumonia and was discharged on antibiotics.  Despite taking which patient was still having persistent cough with shortness of breath.  As per the patient's son patient has had been having falls but not sure if she lost consciousness.  Did not hit her head.  Denies any chest pain.  Patient was admitted in December 2018 4 months ago for second-degree AV block at the time patient refused pacemaker. She is now weak, tired and has chest X ray showing left lung base pneumonia.     Assessment & Plan:   Principal Problem:   HCAP (healthcare-associated pneumonia) Active Problems:   AAA (abdominal aortic aneurysm) without rupture (HCC)   Aortic valve stenosis   #1. HCAP: Patient appears to be doing better. No significant SOB, No cough, no NVD. Legionella and strep urine antigen negative. Continue empiric cefepime. MRSA assay is negative so discontinue vancomycin  # 2 recurrent falls: Probably secondary to gait abnormalities that also history of Mobitz type II AV block. Suspected bradycardia. Monitor closely. Patient refused pacemaker previously  #3 hypertension: Blood pressure is elevated. If he stays elevated will start her on blood pressure medications.  #4 diastolic dysfunction CHF: Previous echo showed normal EF. Continue monitoring. Patient is compensated at the moment.  #5 history of aortic stenosis: Mild to moderate. Based on recent echo.  #6 history of abdominal aortic aneurysm and thoracic aortic aneurysm: Stable at this  point.   DVT prophylaxis: Lovenox  Code Status: DNR  Family Communication: Son at bedside  Disposition Plan:  To be determined based on PT results. Patient from assisted living. Goal will be to return there unless PT recommends skilled nursing facility    Consultants:   Physical therapy  Procedures: None  Antimicrobials:   Cefepime 3/30 >>>  Vancomycin 3/30 -Dc'd today    Subjective: Patient is to much better today. She has some mild shortness of breath otherwise no new complaints. Denied any fever or chills no cough no nausea vomiting or diarrhea  Objective: Vitals:   11/18/17 1800 11/18/17 2044 11/19/17 0513 11/19/17 1100  BP:  (!) 146/74 (!) 172/42 (!) 154/58  Pulse:  82 76 80  Resp:  19 18 18   Temp:  98.4 F (36.9 C) 98.7 F (37.1 C) 98.2 F (36.8 C)  TempSrc:  Oral Oral Oral  SpO2:  98% 95% 98%  Weight:  58.5 kg (129 lb)    Height: 5\' 5"  (1.651 m)       Intake/Output Summary (Last 24 hours) at 11/19/2017 1425 Last data filed at 11/19/2017 0800 Gross per 24 hour  Intake 450 ml  Output 250 ml  Net 200 ml   Filed Weights   11/18/17 2044  Weight: 58.5 kg (129 lb)    Examination:  General exam: Appears calm and comfortable  Respiratory system: Clear to auscultation. Respiratory effort normal. Cardiovascular system: S1 & S2 heard, RRR. No JVD, murmurs, rubs, gallops or clicks. No pedal edema. Gastrointestinal system: Abdomen is nondistended, soft and nontender. No organomegaly  or masses felt. Normal bowel sounds heard. Central nervous system: Alert and oriented. No focal neurological deficits. Extremities: Symmetric 5 x 5 power. Skin: No rashes, lesions or ulcers Psychiatry: Judgement and insight appear normal. Mood & affect appropriate.     Data Reviewed: I have personally reviewed following labs and imaging studies  CBC: Recent Labs  Lab 11/17/17 2228 11/18/17 0409  WBC 10.9* 9.3  NEUTROABS 8.6*  --   HGB 12.0 11.0*  HCT 37.3 34.6*  MCV  92.3 92.0  PLT 237 595   Basic Metabolic Panel: Recent Labs  Lab 11/17/17 2228 11/18/17 0409 11/19/17 1128  NA 135 135 134*  K 3.4* 3.0* 3.6  CL 101 100* 99*  CO2 21* 23 23  GLUCOSE 109* 111* 123*  BUN 16 15 22*  CREATININE 0.89 0.83 0.89  CALCIUM 8.5* 8.3* 8.5*   GFR: Estimated Creatinine Clearance: 37.8 mL/min (by C-G formula based on SCr of 0.89 mg/dL). Liver Function Tests: No results for input(s): AST, ALT, ALKPHOS, BILITOT, PROT, ALBUMIN in the last 168 hours. No results for input(s): LIPASE, AMYLASE in the last 168 hours. No results for input(s): AMMONIA in the last 168 hours. Coagulation Profile: No results for input(s): INR, PROTIME in the last 168 hours. Cardiac Enzymes: No results for input(s): CKTOTAL, CKMB, CKMBINDEX, TROPONINI in the last 168 hours. BNP (last 3 results) No results for input(s): PROBNP in the last 8760 hours. HbA1C: No results for input(s): HGBA1C in the last 72 hours. CBG: No results for input(s): GLUCAP in the last 168 hours. Lipid Profile: No results for input(s): CHOL, HDL, LDLCALC, TRIG, CHOLHDL, LDLDIRECT in the last 72 hours. Thyroid Function Tests: No results for input(s): TSH, T4TOTAL, FREET4, T3FREE, THYROIDAB in the last 72 hours. Anemia Panel: No results for input(s): VITAMINB12, FOLATE, FERRITIN, TIBC, IRON, RETICCTPCT in the last 72 hours. Urine analysis:    Component Value Date/Time   COLORURINE YELLOW 11/18/2017 [place] 11/18/2017 1305   LABSPEC 1.014 11/18/2017 1305   PHURINE 5.0 11/18/2017 1305   GLUCOSEU NEGATIVE 11/18/2017 1305   HGBUR NEGATIVE 11/18/2017 1305   BILIRUBINUR NEGATIVE 11/18/2017 1305   KETONESUR 20 (A) 11/18/2017 1305   PROTEINUR NEGATIVE 11/18/2017 1305   UROBILINOGEN 0.2 08/10/2014 0926   NITRITE NEGATIVE 11/18/2017 1305   LEUKOCYTESUR NEGATIVE 11/18/2017 1305   Sepsis Labs: @LABRCNTIP (procalcitonin:4,lacticidven:4)  ) Recent Results (from the past 240 hour(s))  Urine  culture     Status: None   Collection Time: 11/18/17  1:02 PM  Result Value Ref Range Status   Specimen Description URINE, RANDOM  Final   Special Requests NONE  Final   Culture   Final    NO GROWTH Performed at [facility], [place] [address] [place], [place] [address]    Report Status 11/19/2017 FINAL  Final  MRSA PCR Screening     Status: None   Collection Time: 11/18/17 11:44 PM  Result Value Ref Range Status   MRSA by PCR NEGATIVE NEGATIVE Final    Comment:        The GeneXpert MRSA Assay (FDA approved for NASAL specimens only), is one component of a comprehensive MRSA colonization surveillance program. It is not intended to diagnose MRSA infection nor to guide or monitor treatment for MRSA infections. Performed at [facility], [place] [address] [place], [place] [address]          Radiology Studies: Dg Chest 2 View  Result Date: 11/17/2017 CLINICAL DATA:  84 y/o  F; unwitnessed fall.  Cough. EXAM: CHEST - 2 VIEW COMPARISON:  11/16/2017 chest radiograph. FINDINGS: Stable cardiomegaly given projection and technique. Aortic atherosclerosis with calcification. Stable ill-defined opacification of left lung base. No pleural effusion or pneumothorax. Healing left 5-7 rib fractures with callus. No new displaced rib fracture identified. IMPRESSION: 1. Stable ill-defined opacity in left lung base which may represent atelectasis or pneumonia. 2. Stable healing left 5-7 rib fractures. No acute fracture identified. 3. Stable cardiomegaly and aortic atherosclerosis. Electronically Signed   By: Kristine Garbe M.D.   On: 11/17/2017 23:41   Ct Head Wo Contrast  Result Date: 11/18/2017 CLINICAL DATA:  Multiple falls.  Ataxia. EXAM: CT HEAD WITHOUT CONTRAST TECHNIQUE: Contiguous axial images were obtained from the base of the skull through the vertex without intravenous contrast. COMPARISON:  None. FINDINGS: Brain: No evidence of acute infarction, hemorrhage,  hydrocephalus, extra-axial collection or mass lesion/mass effect. Prominence of the sulci and ventricles compatible with age related brain atrophy. There is diffuse low-attenuation within the subcortical and periventricular white matter compatible with chronic microvascular disease. Vascular: No hyperdense vessel or unexpected calcification. Skull: Normal. Negative for fracture or focal lesion. Sinuses/Orbits: No acute finding. Other: None. IMPRESSION: 1. No acute intracranial abnormalities identified. 2. Chronic small vessel ischemic disease and brain atrophy. Electronically Signed   By: Kerby Moors M.D.   On: 11/18/2017 08:18   Dg Hip Unilat W Or Wo Pelvis 2-3 Views Right  Result Date: 11/17/2017 CLINICAL DATA:  Post unwitnessed fall. EXAM: DG HIP (WITH OR WITHOUT PELVIS) 2-3V RIGHT COMPARISON:  06/16/2017 FINDINGS: Post bilateral total hip arthroplasty. There is no evidence of fracture. The orthopedic components are normally aligned. Healing or healed bilateral superior pubic ramus fractures. IMPRESSION: No evidence of acute fracture. Healing or healed bilateral superior pubic rami fractures. Electronically Signed   By: Fidela Salisbury M.D.   On: 11/17/2017 23:41        Scheduled Meds: . enoxaparin (LOVENOX) injection  40 mg Subcutaneous Q24H  . predniSONE  10 mg Oral Q breakfast   Continuous Infusions: . ceFEPime (MAXIPIME) IV Stopped (11/18/17 2356)     LOS: 0 days    Time spent: 70 minutes    Vartan Kerins,LAWAL, MD [facility] Pager [phone]  If 7PM-7AM, please contact night-coverage [email or web address] Password [email or web address] 11/19/2017, 2:25 PM

## 2017-11-19 NOTE — Progress Notes (Signed)
Pt very confused thinking staff is holding her in a "prison." Patient yelling out and beating on bed saying she is going to call the police.This RN, as well as, Engineer, site, have tried redirecting patient without any success. [name] Dominica Severin has been called a few times to see if his voice could help ease patient, but no answer. Patient was given prn Ativan and it seems to be calming patient down. This nurse informed patient that she is in a safe place and that she is not alone we will be with her all night. Will continue to monitor.   Eleanora Neighbor, RN

## 2017-11-19 NOTE — Progress Notes (Signed)
Pt is stable, vitals stable, pt getting up and sitting in a chair sometime this morning, Family members in bed side and is updating, Robitussin PO given for a complain of cough, HR is maintained at 70's till this time, will continue to monitor the patient  Palma Holter, RN

## 2017-11-19 NOTE — Clinical Social Work Note (Signed)
Clinical Social Work Assessment  Patient Details  Name: Laura Knight MRN: [id number] Date of Birth: March 02, 1925  Date of referral:  11/19/17               Reason for consult:  Facility Placement                Permission sought to share information with:  Family Supports Permission granted to share information::     Name::     Corporate treasurer::     Relationship::  Son  Sport and exercise psychologist Information:     Housing/Transportation Living arrangements for the past 2 months:  [place] of Information:  Adult Children Patient Interpreter Needed:  None Criminal Activity/Legal Involvement Pertinent to Current Situation/Hospitalization:  No - Comment as needed Significant Relationships:  Adult Children Lives with:  Self Do you feel safe going back to the place where you live?  Yes Need for family participation in patient care:  No (Coment)  Care giving concerns:  Pt is only alert to self and place. CSW spoke with pt's son via telephone. Pt lives at [facility].   Social Worker assessment / plan:  CSW spoke with pt's son via telephone. [name] would prefer pt go back to [facility] and get the PT there. However, pt's son does understand ALF may not be able to accomodate pt's LOC at the time of d/c. [name] wants to sit on the decision till he determined if Alf can take pt. [name] did agree to Effie sending pt's referral to [facility] facility and CSW to follow up with bed offers as backup. CSW to reach out to [facility] on 4/1.  Employment status:  Retired Forensic scientist:  Medicare PT Recommendations:  [place] / Referral to community resources:  [place]  Patient/Family's Response to care:  [name] verbalized understanding of CSW role and expressed appreciation for support. [name] denies any concern regarding pt care at this time.   Patient/Family's Understanding of and Emotional Response to Diagnosis, Current Treatment, and  Prognosis:  Pt';s son understanding and realistic regarding pt's physical limitations. [name] understands the pt may need SNF at d/c--[name] would like to explore option of pt going back to ALF before agreeing to SNF. [name] concerned about pt's physical limitations at this time.  CSW will continue to provide support and facilitate d/c needs.   Emotional Assessment Appearance:  Appears stated age Attitude/Demeanor/Rapport:  Unable to Assess Affect (typically observed):  Unable to Assess Orientation:  Oriented to Self, Oriented to Place Alcohol / Substance use:  Not Applicable Psych involvement (Current and /or in the community):  No (Comment)  Discharge Needs  Concerns to be addressed:  Basic Needs, Care Coordination Readmission within the last 30 days:  No Current discharge risk:  Dependent with Mobility Barriers to Discharge:  Continued Medical Work up   [employer], LCSW 11/19/2017, 11:31 AM

## 2017-11-19 NOTE — Progress Notes (Signed)
Lost IV access. Pt is refusing to have another put in. Pt did not get antibiotic. Messaged MD on call to see if medication can be changed to po. Awaiting response.   Eleanora Neighbor, RN

## 2017-11-20 ENCOUNTER — Encounter (HOSPITAL_COMMUNITY): Payer: Self-pay | Admitting: Internal Medicine

## 2017-11-20 DIAGNOSIS — K219 Gastro-esophageal reflux disease without esophagitis: Secondary | ICD-10-CM | POA: Diagnosis present

## 2017-11-20 DIAGNOSIS — I10 Essential (primary) hypertension: Secondary | ICD-10-CM

## 2017-11-20 DIAGNOSIS — J189 Pneumonia, unspecified organism: Secondary | ICD-10-CM | POA: Diagnosis not present

## 2017-11-20 DIAGNOSIS — R498 Other voice and resonance disorders: Secondary | ICD-10-CM | POA: Diagnosis not present

## 2017-11-20 DIAGNOSIS — Z66 Do not resuscitate: Secondary | ICD-10-CM | POA: Diagnosis present

## 2017-11-20 DIAGNOSIS — R278 Other lack of coordination: Secondary | ICD-10-CM | POA: Diagnosis not present

## 2017-11-20 DIAGNOSIS — Y95 Nosocomial condition: Secondary | ICD-10-CM | POA: Diagnosis present

## 2017-11-20 DIAGNOSIS — I350 Nonrheumatic aortic (valve) stenosis: Secondary | ICD-10-CM | POA: Diagnosis present

## 2017-11-20 DIAGNOSIS — I441 Atrioventricular block, second degree: Secondary | ICD-10-CM | POA: Diagnosis present

## 2017-11-20 DIAGNOSIS — J181 Lobar pneumonia, unspecified organism: Secondary | ICD-10-CM | POA: Diagnosis present

## 2017-11-20 DIAGNOSIS — I714 Abdominal aortic aneurysm, without rupture: Secondary | ICD-10-CM | POA: Diagnosis present

## 2017-11-20 DIAGNOSIS — Z87891 Personal history of nicotine dependence: Secondary | ICD-10-CM | POA: Diagnosis not present

## 2017-11-20 DIAGNOSIS — Z79899 Other long term (current) drug therapy: Secondary | ICD-10-CM | POA: Diagnosis not present

## 2017-11-20 DIAGNOSIS — Z96643 Presence of artificial hip joint, bilateral: Secondary | ICD-10-CM | POA: Diagnosis present

## 2017-11-20 DIAGNOSIS — R41841 Cognitive communication deficit: Secondary | ICD-10-CM | POA: Diagnosis not present

## 2017-11-20 DIAGNOSIS — M6281 Muscle weakness (generalized): Secondary | ICD-10-CM | POA: Diagnosis not present

## 2017-11-20 DIAGNOSIS — I5032 Chronic diastolic (congestive) heart failure: Secondary | ICD-10-CM | POA: Diagnosis present

## 2017-11-20 DIAGNOSIS — R4182 Altered mental status, unspecified: Secondary | ICD-10-CM | POA: Diagnosis not present

## 2017-11-20 DIAGNOSIS — R296 Repeated falls: Secondary | ICD-10-CM | POA: Diagnosis present

## 2017-11-20 DIAGNOSIS — I110 Hypertensive heart disease with heart failure: Secondary | ICD-10-CM | POA: Diagnosis present

## 2017-11-20 DIAGNOSIS — R2689 Other abnormalities of gait and mobility: Secondary | ICD-10-CM | POA: Diagnosis not present

## 2017-11-20 HISTORY — DX: Essential (primary) hypertension: I10

## 2017-11-20 MED ORDER — LEVOFLOXACIN 750 MG PO TABS
750.0000 mg | ORAL_TABLET | ORAL | Status: DC
  Administered 2017-11-20: 750 mg via ORAL
  Filled 2017-11-20: qty 1

## 2017-11-20 MED ORDER — ENOXAPARIN SODIUM 30 MG/0.3ML ~~LOC~~ SOLN
30.0000 mg | SUBCUTANEOUS | Status: DC
  Administered 2017-11-20 – 2017-11-22 (×3): 30 mg via SUBCUTANEOUS
  Filled 2017-11-20 (×3): qty 0.3

## 2017-11-20 MED ORDER — METOPROLOL TARTRATE 25 MG PO TABS
25.0000 mg | ORAL_TABLET | Freq: Two times a day (BID) | ORAL | Status: DC
  Administered 2017-11-20 – 2017-11-23 (×7): 25 mg via ORAL
  Filled 2017-11-20 (×7): qty 1

## 2017-11-20 NOTE — Accreditation Note (Signed)
Paged Dr. Gwenith Daily.

## 2017-11-20 NOTE — Progress Notes (Signed)
Pt repeatedly trying to get out of bed.  Brought pt to front dest in chair. Chair alarm on.

## 2017-11-20 NOTE — Care Management Note (Addendum)
Case Management Note  Patient Details  Name: Laura Knight MRN: [id number] Date of Birth: 1925-03-18  Subjective/Objective:    Admitted for Healthcare associated Pneumonia              Action/Plan: Home DME: Laura Knight - 2 wheels. Prior to admission patient was resident [facility]; not a candidate for [facility] at this time.  Physical Therapy consulted and recommended SNF placement.  Expected Discharge [place], LCSW "Lorriane Shire" following for SNF placement.   Expected Discharge Date:                  Expected Discharge Plan:  Skilled Nursing Facility  In-House Referral:  Clinical Social Work  Discharge planning Services  CM Consult  Post Acute Care Choice:    Choice offered to:    Son Status of Service:  In process, will continue to follow   Kristen Cardinal, RN  Nurse [facility] 11/20/2017, 10:44 AM

## 2017-11-20 NOTE — Progress Notes (Signed)
Patient ID: Laura Knight, female   DOB: [birth year], 84 y.o.   MRN: [id number]  PROGRESS NOTE    Laura Knight  JOI:[id number] DOB: December 16, 1924 DOA: 11/17/2017 PCP: Leeroy Cha, MD   Outpatient Specialists: None   Brief Narrative: Laura Knight is a 84 y.o. female with history of abdominal aortic aneurysm brought to the ER because of recurrent falls.  This is the fourth visit to the ER in the last 1 week.  During the last visit patient was found to have pneumonia and was discharged on antibiotics.  Despite taking which patient was still having persistent cough with shortness of breath.  As per the patient's son patient has had been having falls but not sure if she lost consciousness.  Did not hit her head.  Denies any chest pain.  Patient was admitted in December 2018 4 months ago for second-degree AV block at the time patient refused pacemaker. She is now weak, tired and has chest X ray showing left lung base pneumonia.     Assessment & Plan:   Principal Problem:   HCAP (healthcare-associated pneumonia) Active Problems:   AAA (abdominal aortic aneurysm) without rupture (HCC)   Aortic valve stenosis   Benign essential HTN   #1. HCAP: Patient continues to do better.  She is currently able to walk with PT today.  Still weak and gait is visibly off.  She has lost her IV and will switch her to oral Levaquin today.  # 2 recurrent falls: Probably secondary to gait abnormalities that also history of Mobitz type II AV block. Suspected bradycardia. Monitor closely. Patient refused pacemaker previously. Physical therapy has recommended a skilled nursing facility was 24-hour supervision.  We'll update family on proceed with SNF placement.  #3 hypertension: Blood pressure is elevated. Will start metoprolol 25 mg twice a day on monitor  #4 diastolic dysfunction CHF: Previous echo showed normal EF. Continue monitoring. Patient is compensated at the moment.  #5 history of aortic  stenosis: Mild to moderate. Based on recent echo.  #6 history of abdominal aortic aneurysm and thoracic aortic aneurysm: Stable at this point.   DVT prophylaxis: Lovenox  Code Status: DNR  Family Communication: Son at bedside  Disposition Plan:  SNF   Consultants:   Physical therapy  Procedures: None  Antimicrobials:   Cefepime 3/30 -4/1  Vancomycin 3/30 -Dc'd today   Levaquin 4/1 >>>   Subjective: Patient Has no complaints today.  She was able to walk was PT but gait is abnormal.  Blood pressure has been consistently elevated.  Objective: Vitals:   11/19/17 1629 11/19/17 1700 11/19/17 2138 11/20/17 0826  BP: (!) 170/55 140/80 135/70 (!) 190/60  Pulse: 71  92 100  Resp: 18  18 18   Temp: 98.4 F (36.9 C)  98.4 F (36.9 C) 98.2 F (36.8 C)  TempSrc: Oral  Oral Oral  SpO2: 94%  95% 93%  Weight:      Height:        Intake/Output Summary (Last 24 hours) at 11/20/2017 0917 Last data filed at 11/20/2017 0651 Gross per 24 hour  Intake 404 ml  Output 500 ml  Net -96 ml   Filed Weights   11/18/17 2044  Weight: 58.5 kg (129 lb)    Examination:  General exam: Appears calm and comfortable  Respiratory system: Clear to auscultation. Respiratory effort normal. Cardiovascular system: S1 & S2 heard, RRR. No JVD, murmurs, rubs, gallops or clicks. No pedal edema. Gastrointestinal system: Abdomen is nondistended, soft and nontender. No  organomegaly or masses felt. Normal bowel sounds heard. Central nervous system: Alert and oriented. No focal neurological deficits. Extremities: Symmetric 5 x 5 power. Skin: No rashes, lesions or ulcers Psychiatry: Judgement and insight appear normal. Mood & affect appropriate.     Data Reviewed: I have personally reviewed following labs and imaging studies  CBC: Recent Labs  Lab 11/17/17 2228 11/18/17 0409  WBC 10.9* 9.3  NEUTROABS 8.6*  --   HGB 12.0 11.0*  HCT 37.3 34.6*  MCV 92.3 92.0  PLT 237 161   Basic Metabolic  Panel: Recent Labs  Lab 11/17/17 2228 11/18/17 0409 11/19/17 1128  NA 135 135 134*  K 3.4* 3.0* 3.6  CL 101 100* 99*  CO2 21* 23 23  GLUCOSE 109* 111* 123*  BUN 16 15 22*  CREATININE 0.89 0.83 0.89  CALCIUM 8.5* 8.3* 8.5*   GFR: Estimated Creatinine Clearance: 37.8 mL/min (by C-G formula based on SCr of 0.89 mg/dL). Liver Function Tests: No results for input(s): AST, ALT, ALKPHOS, BILITOT, PROT, ALBUMIN in the last 168 hours. No results for input(s): LIPASE, AMYLASE in the last 168 hours. No results for input(s): AMMONIA in the last 168 hours. Coagulation Profile: No results for input(s): INR, PROTIME in the last 168 hours. Cardiac Enzymes: No results for input(s): CKTOTAL, CKMB, CKMBINDEX, TROPONINI in the last 168 hours. BNP (last 3 results) No results for input(s): PROBNP in the last 8760 hours. HbA1C: No results for input(s): HGBA1C in the last 72 hours. CBG: No results for input(s): GLUCAP in the last 168 hours. Lipid Profile: No results for input(s): CHOL, HDL, LDLCALC, TRIG, CHOLHDL, LDLDIRECT in the last 72 hours. Thyroid Function Tests: No results for input(s): TSH, T4TOTAL, FREET4, T3FREE, THYROIDAB in the last 72 hours. Anemia Panel: No results for input(s): VITAMINB12, FOLATE, FERRITIN, TIBC, IRON, RETICCTPCT in the last 72 hours. Urine analysis:    Component Value Date/Time   COLORURINE YELLOW 11/18/2017 [place] 11/18/2017 1305   LABSPEC 1.014 11/18/2017 1305   PHURINE 5.0 11/18/2017 1305   GLUCOSEU NEGATIVE 11/18/2017 1305   HGBUR NEGATIVE 11/18/2017 1305   BILIRUBINUR NEGATIVE 11/18/2017 1305   KETONESUR 20 (A) 11/18/2017 1305   PROTEINUR NEGATIVE 11/18/2017 1305   UROBILINOGEN 0.2 08/10/2014 0926   NITRITE NEGATIVE 11/18/2017 1305   LEUKOCYTESUR NEGATIVE 11/18/2017 1305   Sepsis Labs: @LABRCNTIP (procalcitonin:4,lacticidven:4)  ) Recent Results (from the past 240 hour(s))  Urine culture     Status: None   Collection Time:  11/18/17  1:02 PM  Result Value Ref Range Status   Specimen Description URINE, RANDOM  Final   Special Requests NONE  Final   Culture   Final    NO GROWTH Performed at [facility], [place] [address] [place], [place] [address]    Report Status 11/19/2017 FINAL  Final  MRSA PCR Screening     Status: None   Collection Time: 11/18/17 11:44 PM  Result Value Ref Range Status   MRSA by PCR NEGATIVE NEGATIVE Final    Comment:        The GeneXpert MRSA Assay (FDA approved for NASAL specimens only), is one component of a comprehensive MRSA colonization surveillance program. It is not intended to diagnose MRSA infection nor to guide or monitor treatment for MRSA infections. Performed at [facility], [place] [address], [place], [place] [address]          Radiology Studies: No results found.      Scheduled Meds: . enoxaparin (  LOVENOX) injection  40 mg Subcutaneous Q24H  . metoprolol tartrate  25 mg Oral BID  . predniSONE  10 mg Oral Q breakfast   Continuous Infusions: . ceFEPime (MAXIPIME) IV Stopped (11/18/17 2356)     LOS: 0 days    Time spent: 4 minutes    Laura Knight,LAWAL, MD [facility] Pager [phone]  If 7PM-7AM, please contact night-coverage [email or web address] Password [facility] 11/20/2017, 9:17 AM

## 2017-11-20 NOTE — Progress Notes (Signed)
Paged Pt to see patient this am if possible.

## 2017-11-20 NOTE — Progress Notes (Signed)
Physical Therapy Treatment Patient Details Name: Laura Knight MRN: [id number] DOB: [birth year] Today's Date: 11/20/2017    History of Present Illness Laura Knight is a 84 y.o. female with history of abdominal aortic aneurysm brought to the ER because of recurrent falls.  This is the fourth visit to the ER in the last 1 week.  During the last visit patient was found to have pneumonia and was discharged on antibiotics.  Despite taking which patient was still having persistent cough with shortness of breath.  As per the patient's son patient has had been having falls but not sure if she lost consciousness.  Did not hit her head.  Denies any chest pain.  Patient was admitted in December 2018 4 months ago for second-degree AV block at the time patient refused pacemaker.    PT Comments    Pt at nurses desk on arrival as she is trying to get up unassisted when alone in room.  Pt will require 24 hour assistance at this time to reduce risk of falls.  Pt however was able to progress to ambulation and improved posture in standing.  Will continue to recommend SNF at this time with 24 hour assistance.  If ALF can provide 24 hour assistance she would be safe to return but if this demand for care is not available then SNF is the safest option.      Follow Up Recommendations  SNF;Supervision/Assistance - 24 hour     Equipment Recommendations  None recommended by PT    Recommendations for Other Services Other (comment);OT consult(neuro consult)     Precautions / Restrictions Precautions Precautions: Fall Precaution Comments: has fallen numerous times in past week Restrictions Weight Bearing Restrictions: No    Mobility  Bed Mobility Overal bed mobility: Needs Assistance             General bed mobility comments: Pt sitting at the nurses station on arrival in her recliner chair.    Transfers Overall transfer level: Needs assistance Equipment used: Rolling walker (2 wheeled) Transfers:  Sit to/from Stand Sit to Stand: Min assist         General transfer comment: Cues for hand placement as patient is quick to reach and pull on RW for support.  Pt unsteady in standing with posterior lean.    Ambulation/Gait Ambulation/Gait assistance: Mod assist Ambulation Distance (Feet): 50 Feet Assistive device: Rolling walker (2 wheeled) Gait Pattern/deviations: Step-through pattern;Shuffle;Trunk flexed;Ataxic;Decreased step length - right   Gait velocity interpretation: Below normal speed for age/gender General Gait Details: Pt much improved from last session and able to progress to gait with RW.  Pt required cues for upper trunk control, increasing stride length on R side and maintain safe proximity to RW.  Pt has a tendnecy to speed up gait and push RW too far.  Min assistance to mod assistance to maintain stability during gait trials.    Stairs            Wheelchair Mobility    Modified Rankin (Stroke Patients Only)       Balance Overall balance assessment: Needs assistance   Sitting balance-Leahy Scale: Poor Sitting balance - Comments: needed min A to maintain sitting due to flexed posture     Standing balance-Leahy Scale: Poor Standing balance comment: UE support needed as well as mod A to maintain standing  Cognition Arousal/Alertness: Awake/alert Behavior During Therapy: Flat affect Overall Cognitive Status: History of cognitive impairments - at baseline                                 General Comments: required re-orientation throughout session.  Pt also appears to be [facility] which can make following commands difficult.        Exercises      General Comments        Pertinent Vitals/Pain Pain Assessment: Faces Faces Pain Scale: No hurt    Home Living                      Prior Function            PT Goals (current goals can now be found in the care plan section) Acute Rehab PT  Goals Patient Stated Goal: none stated, family wants her to be able to go back to [facility] to decrease change for her with her mental status Potential to Achieve Goals: Fair Progress towards PT goals: Progressing toward goals    Frequency    Min 2X/week      PT Plan Current plan remains appropriate    Co-evaluation              AM-PAC PT "6 Clicks" Daily Activity  Outcome Measure  Difficulty turning over in bed (including adjusting bedclothes, sheets and blankets)?: Unable Difficulty moving from lying on back to sitting on the side of the bed? : Unable Difficulty sitting down on and standing up from a chair with arms (e.g., wheelchair, bedside commode, etc,.)?: Unable Help needed moving to and from a bed to chair (including a wheelchair)?: A Lot Help needed walking in hospital room?: A Lot Help needed climbing 3-5 steps with a railing? : Total 6 Click Score: 8    End of Session Equipment Utilized During Treatment: Gait belt Activity Tolerance: Patient tolerated treatment well Patient left: in chair;with chair alarm set;with call bell/phone within reach;with family/visitor present(Pt placed back at the nurses station where she was located on arrival.  ) Nurse Communication: Mobility status PT Visit Diagnosis: Unsteadiness on feet (R26.81);Muscle weakness (generalized) (M62.81);Repeated falls (R29.6);Difficulty in walking, not elsewhere classified (R26.2);Pain Pain - part of body: (back)     Time: 1036-1100 PT Time Calculation (min) (ACUTE ONLY): 24 min  Charges:  $Gait Training: 8-22 mins $Therapeutic Activity: 8-22 mins                    G Codes:       Laura Knight, PTA pager [phone]    Cristela Blue 11/20/2017, 11:14 AM

## 2017-11-20 NOTE — Progress Notes (Signed)
Pt's Bp 190/60 at this time.  Has ranged from 419-379 systollic and has been as high as 024 dialstollic.  Paged Dr. Gwenith Daily.

## 2017-11-20 NOTE — Progress Notes (Signed)
Tele called pt is having some missed beats with 1 and 1/2 pauses.

## 2017-11-21 MED ORDER — CEFDINIR 300 MG PO CAPS
300.0000 mg | ORAL_CAPSULE | Freq: Two times a day (BID) | ORAL | Status: DC
  Administered 2017-11-22 – 2017-11-23 (×3): 300 mg via ORAL
  Filled 2017-11-21 (×3): qty 1

## 2017-11-21 NOTE — Progress Notes (Signed)
Patient ID: Laura Knight, female   DOB: March 12, 1925, 83 y.o.   MRN: [id number]  PROGRESS NOTE    Laura Knight  IEP:[id number] DOB: 08-25-1924 DOA: 11/17/2017 PCP: Leeroy Cha, MD   Outpatient Specialists: None   Brief Narrative: Laura Knight is a 84 y.o. female with history of abdominal aortic aneurysm brought to the ER because of recurrent falls.  This is the fourth visit to the ER in the last 1 week.  During the last visit patient was found to have pneumonia and was discharged on antibiotics.  Despite taking which patient was still having persistent cough with shortness of breath.  As per the patient's son patient has had been having falls but not sure if she lost consciousness.  Did not hit her head.  Denies any chest pain.  Patient was admitted in December 2018 4 months ago for second-degree AV block at the time patient refused pacemaker. She is now weak, tired and has chest X ray showing left lung base pneumonia.     Assessment & Plan:   Principal Problem:   HCAP (healthcare-associated pneumonia) Active Problems:   AAA (abdominal aortic aneurysm) without rupture (HCC)   Aortic valve stenosis   Frequent falls   Benign essential HTN   #1. HCAP: Patient doing much better on oral antibiotics. She is day #3. We will continue antibiotics for 7 days. No oxygen requirement at the moment  # 2 recurrent falls: Continue physical therapy and occupational therapy. Patient is at risk of falls. She will go to skilled nursing facility  #3 hypertension: Blood pressure is better controlled with metoprolol. Continue at this point  #4 diastolic dysfunction CHF: Previous echo showed normal EF. Continue monitoring. Patient is compensated at the moment.  #5 history of aortic stenosis: Mild to moderate. Based on recent echo.  #6 history of abdominal aortic aneurysm and thoracic aortic aneurysm: Stable at this point.   DVT prophylaxis: Lovenox  Code Status: DNR  Family  Communication: Son at bedside  Disposition Plan:  SNF   Consultants:   Physical therapy  Procedures: None  Antimicrobials:   Cefepime 3/30 -4/1  Vancomycin 3/30 -Dc'd today   Levaquin 4/1 >>>   Subjective: Patient Has no complaints today.  She was able to walk was PT but gait is abnormal.  Blood pressure has been consistently elevated.  Objective: Vitals:   11/20/17 2142 11/21/17 0529 11/21/17 1014 11/21/17 1103  BP: (!) 160/85 (!) 155/76 (!) 179/76   Pulse: 89 85 81   Resp: 18 18    Temp: 98.4 F (36.9 C) 98.3 F (36.8 C)  97.7 F (36.5 C)  TempSrc: Oral Oral  Oral  SpO2: 93% 95%  95%  Weight:      Height:        Intake/Output Summary (Last 24 hours) at 11/21/2017 1340 Last data filed at 11/21/2017 1000 Gross per 24 hour  Intake 360 ml  Output 1500 ml  Net -1140 ml   Filed Weights   11/18/17 2044  Weight: 58.5 kg (129 lb)    Examination:  General exam: Appears calm and comfortable  Respiratory system: Clear to auscultation. Respiratory effort normal. Cardiovascular system: S1 & S2 heard, RRR. No JVD, murmurs, rubs, gallops or clicks. No pedal edema. Gastrointestinal system: Abdomen is nondistended, soft and nontender. No organomegaly or masses felt. Normal bowel sounds heard. Central nervous system: Alert and oriented. No focal neurological deficits. Extremities: Symmetric 5 x 5 power. Skin: No rashes, lesions or ulcers Psychiatry: Judgement and insight  appear normal. Mood & affect appropriate.     Data Reviewed: I have personally reviewed following labs and imaging studies  CBC: Recent Labs  Lab 11/17/17 2228 11/18/17 0409  WBC 10.9* 9.3  NEUTROABS 8.6*  --   HGB 12.0 11.0*  HCT 37.3 34.6*  MCV 92.3 92.0  PLT 237 557   Basic Metabolic Panel: Recent Labs  Lab 11/17/17 2228 11/18/17 0409 11/19/17 1128  NA 135 135 134*  K 3.4* 3.0* 3.6  CL 101 100* 99*  CO2 21* 23 23  GLUCOSE 109* 111* 123*  BUN 16 15 22*  CREATININE 0.89 0.83 0.89    CALCIUM 8.5* 8.3* 8.5*   GFR: Estimated Creatinine Clearance: 37.8 mL/min (by C-G formula based on SCr of 0.89 mg/dL). Liver Function Tests: No results for input(s): AST, ALT, ALKPHOS, BILITOT, PROT, ALBUMIN in the last 168 hours. No results for input(s): LIPASE, AMYLASE in the last 168 hours. No results for input(s): AMMONIA in the last 168 hours. Coagulation Profile: No results for input(s): INR, PROTIME in the last 168 hours. Cardiac Enzymes: No results for input(s): CKTOTAL, CKMB, CKMBINDEX, TROPONINI in the last 168 hours. BNP (last 3 results) No results for input(s): PROBNP in the last 8760 hours. HbA1C: No results for input(s): HGBA1C in the last 72 hours. CBG: No results for input(s): GLUCAP in the last 168 hours. Lipid Profile: No results for input(s): CHOL, HDL, LDLCALC, TRIG, CHOLHDL, LDLDIRECT in the last 72 hours. Thyroid Function Tests: No results for input(s): TSH, T4TOTAL, FREET4, T3FREE, THYROIDAB in the last 72 hours. Anemia Panel: No results for input(s): VITAMINB12, FOLATE, FERRITIN, TIBC, IRON, RETICCTPCT in the last 72 hours. Urine analysis:    Component Value Date/Time   COLORURINE YELLOW 11/18/2017 [place] 11/18/2017 1305   LABSPEC 1.014 11/18/2017 1305   PHURINE 5.0 11/18/2017 1305   GLUCOSEU NEGATIVE 11/18/2017 1305   HGBUR NEGATIVE 11/18/2017 1305   BILIRUBINUR NEGATIVE 11/18/2017 1305   KETONESUR 20 (A) 11/18/2017 1305   PROTEINUR NEGATIVE 11/18/2017 1305   UROBILINOGEN 0.2 08/10/2014 0926   NITRITE NEGATIVE 11/18/2017 1305   LEUKOCYTESUR NEGATIVE 11/18/2017 1305   Sepsis Labs: @LABRCNTIP (procalcitonin:4,lacticidven:4)  ) Recent Results (from the past 240 hour(s))  Urine culture     Status: None   Collection Time: 11/18/17  1:02 PM  Result Value Ref Range Status   Specimen Description URINE, RANDOM  Final   Special Requests NONE  Final   Culture   Final    NO GROWTH Performed at [facility], [place] [address], [place], [place] [address]    Report Status 11/19/2017 FINAL  Final  MRSA PCR Screening     Status: None   Collection Time: 11/18/17 11:44 PM  Result Value Ref Range Status   MRSA by PCR NEGATIVE NEGATIVE Final    Comment:        The GeneXpert MRSA Assay (FDA approved for NASAL specimens only), is one component of a comprehensive MRSA colonization surveillance program. It is not intended to diagnose MRSA infection nor to guide or monitor treatment for MRSA infections. Performed at [facility], [place] [address] [place], [place] [address]          Radiology Studies: No results found.      Scheduled Meds: . [START ON 11/22/2017] cefdinir  300 mg Oral Q12H  . enoxaparin (LOVENOX) injection  30 mg Subcutaneous Q24H  . metoprolol tartrate  25 mg Oral BID  . predniSONE  10 mg  Oral Q breakfast   Continuous Infusions:    LOS: 1 day    Time spent: 37 minutes    Kazia Grisanti,LAWAL, MD [facility] Pager [phone]  If 7PM-7AM, please contact night-coverage [email or web address] Password [email or web address] 11/21/2017, 1:40 PM

## 2017-11-22 NOTE — Progress Notes (Signed)
   11/22/17 1400  Clinical Encounter Type  Visited With Patient;Patient and family together  Visit Type Initial  Referral From Family  Consult/Referral To Chaplain  Spiritual Encounters  Spiritual Needs Prayer;Emotional  Stress Factors  Patient Stress Factors Exhausted  Family Stress Factors Exhausted    Pt was laying in bed seemingly  trying to catch some sleep. Chaplain introduced herself and Pt woke up. Pt was very receptive of chaplain but talked in a low voice with coughs in between. Chaplain listened to Pt more than twice talking of her room at [facility] being her home. Chaplain explained to Pt that she had stepped in to have a conversation and to encourage her to hung in there and reminding her that she was not alone. Chaplain provided emotional support through reflective listening, empathic encouragement and compassionate presence.  Jelisa  a Medical sales representative, [employer]

## 2017-11-22 NOTE — Clinical Social Work Note (Addendum)
Ms. Kataoka is Medicare A&B and was determined to be inpatient on 11/20/17. Patient can d/c to SNF on 11/23/17. Patient from ALF and will discharge to a skilled facility - [facility] chosen. CSW has talked with patient's son Dominica Severin on 4/2 and talked with daughter Neoma Laming today in the visitors room on 85M regarding patient's discharge. [facility] has made a bed offer and Melvenia Beam, admissions director aware that the family wants their facility. Preference is private room, however per Melvenia Beam, they cannot guarantee a private room and this was relayed to daughter today. CSW will continue to follow and facilitate discharge to [facility] when medically stable.  Shadi Larner Givens, MSW, LCSW Licensed Clinical Social Worker [place] [phone]

## 2017-11-22 NOTE — Progress Notes (Signed)
Patient ID: Laura Knight, female   DOB: Apr 28, 1925, 84 y.o.   MRN: [id number]  PROGRESS NOTE    Bijal Siglin  NID:[id number] DOB: Oct 10, 1924 DOA: 11/17/2017 PCP: Leeroy Cha, MD   Outpatient Specialists: None   Brief Narrative: Sharee Sturdy is a 84 y.o. female with history of abdominal aortic aneurysm brought to the ER because of recurrent falls.  This is the fourth visit to the ER in the last 1 week.  During the last visit patient was found to have pneumonia and was discharged on antibiotics.  Despite taking which patient was still having persistent cough with shortness of breath.  As per the patient's son patient has had been having falls but not sure if she lost consciousness.  Did not hit her head.  Denies any chest pain.  Patient was admitted in December 2018 4 months ago for second-degree AV block at the time patient refused pacemaker. She is now weak, tired and has chest X ray showing left lung base pneumonia.     Assessment & Plan:   Principal Problem:   HCAP (healthcare-associated pneumonia) Active Problems:   AAA (abdominal aortic aneurysm) without rupture (HCC)   Aortic valve stenosis   Frequent falls   Benign essential HTN   #1. HCAP: No new issues. Patient will complete antibiotics in 2 days. She is currently on Omnicef  # 2 recurrent falls: Patient awaiting placement to skilled nursing facility.  #3 hypertension: Blood pressure is still elevated. Will continue metoprolol 25 mg twice a day and add HCTZ 25 mg daily.  #4 diastolic dysfunction CHF: Previous echo showed normal EF. Continue monitoring. Patient is compensated at the moment.  #5 history of aortic stenosis: Mild to moderate. Based on recent echo.  #6 history of abdominal aortic aneurysm and thoracic aortic aneurysm: Stable at this point.   DVT prophylaxis: Lovenox  Code Status: DNR  Family Communication: Son at bedside  Disposition Plan:  SNF   Consultants:   Physical  therapy  Procedures: None  Antimicrobials:   Cefepime 3/30 -4/1  Vancomycin 3/30 -Dc'd today   Levaquin 4/1 >>>   Subjective: Patient Has no complaints today.  She was able to walk was PT but gait is abnormal.  Blood pressure has been consistently elevated.  Objective: Vitals:   11/21/17 1103 11/21/17 1648 11/21/17 2053 11/22/17 0648  BP:  (!) 172/68 (!) 183/88 (!) 147/69  Pulse:  68 78 61  Resp:  18 19 20   Temp: 97.7 F (36.5 C) 98.5 F (36.9 C) (!) 97.5 F (36.4 C) 98.3 F (36.8 C)  TempSrc: Oral Oral Oral Oral  SpO2: 95% 96% 90% 98%  Weight:   58.4 kg (128 lb 12 oz)   Height:        Intake/Output Summary (Last 24 hours) at 11/22/2017 1227 Last data filed at 11/22/2017 0649 Gross per 24 hour  Intake 340 ml  Output 550 ml  Net -210 ml   Filed Weights   11/18/17 2044 11/21/17 2053  Weight: 58.5 kg (129 lb) 58.4 kg (128 lb 12 oz)    Examination:  General exam: Appears calm and comfortable  Respiratory system: Clear to auscultation. Respiratory effort normal. Cardiovascular system: S1 & S2 heard, RRR. No JVD, murmurs, rubs, gallops or clicks. No pedal edema. Gastrointestinal system: Abdomen is nondistended, soft and nontender. No organomegaly or masses felt. Normal bowel sounds heard. Central nervous system: Alert and oriented. No focal neurological deficits. Extremities: Symmetric 5 x 5 power. Skin: No rashes, lesions or ulcers  Psychiatry: Judgement and insight appear normal. Mood & affect appropriate.     Data Reviewed: I have personally reviewed following labs and imaging studies  CBC: Recent Labs  Lab 11/17/17 2228 11/18/17 0409  WBC 10.9* 9.3  NEUTROABS 8.6*  --   HGB 12.0 11.0*  HCT 37.3 34.6*  MCV 92.3 92.0  PLT 237 852   Basic Metabolic Panel: Recent Labs  Lab 11/17/17 2228 11/18/17 0409 11/19/17 1128  NA 135 135 134*  K 3.4* 3.0* 3.6  CL 101 100* 99*  CO2 21* 23 23  GLUCOSE 109* 111* 123*  BUN 16 15 22*  CREATININE 0.89 0.83 0.89   CALCIUM 8.5* 8.3* 8.5*   GFR: Estimated Creatinine Clearance: 37.8 mL/min (by C-G formula based on SCr of 0.89 mg/dL). Liver Function Tests: No results for input(s): AST, ALT, ALKPHOS, BILITOT, PROT, ALBUMIN in the last 168 hours. No results for input(s): LIPASE, AMYLASE in the last 168 hours. No results for input(s): AMMONIA in the last 168 hours. Coagulation Profile: No results for input(s): INR, PROTIME in the last 168 hours. Cardiac Enzymes: No results for input(s): CKTOTAL, CKMB, CKMBINDEX, TROPONINI in the last 168 hours. BNP (last 3 results) No results for input(s): PROBNP in the last 8760 hours. HbA1C: No results for input(s): HGBA1C in the last 72 hours. CBG: No results for input(s): GLUCAP in the last 168 hours. Lipid Profile: No results for input(s): CHOL, HDL, LDLCALC, TRIG, CHOLHDL, LDLDIRECT in the last 72 hours. Thyroid Function Tests: No results for input(s): TSH, T4TOTAL, FREET4, T3FREE, THYROIDAB in the last 72 hours. Anemia Panel: No results for input(s): VITAMINB12, FOLATE, FERRITIN, TIBC, IRON, RETICCTPCT in the last 72 hours. Urine analysis:    Component Value Date/Time   COLORURINE YELLOW 11/18/2017 [place] 11/18/2017 1305   LABSPEC 1.014 11/18/2017 1305   PHURINE 5.0 11/18/2017 1305   GLUCOSEU NEGATIVE 11/18/2017 1305   HGBUR NEGATIVE 11/18/2017 1305   BILIRUBINUR NEGATIVE 11/18/2017 1305   KETONESUR 20 (A) 11/18/2017 1305   PROTEINUR NEGATIVE 11/18/2017 1305   UROBILINOGEN 0.2 08/10/2014 0926   NITRITE NEGATIVE 11/18/2017 1305   LEUKOCYTESUR NEGATIVE 11/18/2017 1305   Sepsis Labs: @LABRCNTIP (procalcitonin:4,lacticidven:4)  ) Recent Results (from the past 240 hour(s))  Urine culture     Status: None   Collection Time: 11/18/17  1:02 PM  Result Value Ref Range Status   Specimen Description URINE, RANDOM  Final   Special Requests NONE  Final   Culture   Final    NO GROWTH Performed at [facility], [place] [address] [place], [place] [address]    Report Status 11/19/2017 FINAL  Final  MRSA PCR Screening     Status: None   Collection Time: 11/18/17 11:44 PM  Result Value Ref Range Status   MRSA by PCR NEGATIVE NEGATIVE Final    Comment:        The GeneXpert MRSA Assay (FDA approved for NASAL specimens only), is one component of a comprehensive MRSA colonization surveillance program. It is not intended to diagnose MRSA infection nor to guide or monitor treatment for MRSA infections. Performed at [facility], [place] [address] [place], [place] [address]          Radiology Studies: No results found.      Scheduled Meds: . cefdinir  300 mg Oral Q12H  . enoxaparin (LOVENOX) injection  30 mg Subcutaneous Q24H  . metoprolol tartrate  25 mg Oral BID  . predniSONE  10 mg  Oral Q breakfast   Continuous Infusions:    LOS: 2 days    Time spent: 35 minutes    Aziah Brostrom,LAWAL, MD [facility] Pager [phone]  If 7PM-7AM, please contact night-coverage [email or web address] Password [facility] 11/22/2017, 12:27 PM

## 2017-11-23 DIAGNOSIS — R41841 Cognitive communication deficit: Secondary | ICD-10-CM | POA: Diagnosis not present

## 2017-11-23 DIAGNOSIS — R05 Cough: Secondary | ICD-10-CM | POA: Diagnosis not present

## 2017-11-23 DIAGNOSIS — F329 Major depressive disorder, single episode, unspecified: Secondary | ICD-10-CM | POA: Diagnosis not present

## 2017-11-23 DIAGNOSIS — R4182 Altered mental status, unspecified: Secondary | ICD-10-CM | POA: Diagnosis not present

## 2017-11-23 DIAGNOSIS — R498 Other voice and resonance disorders: Secondary | ICD-10-CM | POA: Diagnosis not present

## 2017-11-23 DIAGNOSIS — R54 Age-related physical debility: Secondary | ICD-10-CM | POA: Diagnosis not present

## 2017-11-23 DIAGNOSIS — K529 Noninfective gastroenteritis and colitis, unspecified: Secondary | ICD-10-CM | POA: Diagnosis not present

## 2017-11-23 DIAGNOSIS — R197 Diarrhea, unspecified: Secondary | ICD-10-CM | POA: Diagnosis not present

## 2017-11-23 DIAGNOSIS — R4189 Other symptoms and signs involving cognitive functions and awareness: Secondary | ICD-10-CM | POA: Diagnosis not present

## 2017-11-23 DIAGNOSIS — R3915 Urgency of urination: Secondary | ICD-10-CM | POA: Diagnosis not present

## 2017-11-23 DIAGNOSIS — R0781 Pleurodynia: Secondary | ICD-10-CM | POA: Diagnosis not present

## 2017-11-23 DIAGNOSIS — J3489 Other specified disorders of nose and nasal sinuses: Secondary | ICD-10-CM | POA: Diagnosis not present

## 2017-11-23 DIAGNOSIS — R451 Restlessness and agitation: Secondary | ICD-10-CM | POA: Diagnosis not present

## 2017-11-23 DIAGNOSIS — R001 Bradycardia, unspecified: Secondary | ICD-10-CM | POA: Diagnosis not present

## 2017-11-23 DIAGNOSIS — R2689 Other abnormalities of gait and mobility: Secondary | ICD-10-CM | POA: Diagnosis not present

## 2017-11-23 DIAGNOSIS — W19XXXD Unspecified fall, subsequent encounter: Secondary | ICD-10-CM | POA: Diagnosis not present

## 2017-11-23 DIAGNOSIS — W19XXXA Unspecified fall, initial encounter: Secondary | ICD-10-CM | POA: Diagnosis not present

## 2017-11-23 DIAGNOSIS — R296 Repeated falls: Secondary | ICD-10-CM | POA: Diagnosis not present

## 2017-11-23 DIAGNOSIS — J181 Lobar pneumonia, unspecified organism: Secondary | ICD-10-CM | POA: Diagnosis not present

## 2017-11-23 DIAGNOSIS — M6281 Muscle weakness (generalized): Secondary | ICD-10-CM | POA: Diagnosis not present

## 2017-11-23 DIAGNOSIS — R278 Other lack of coordination: Secondary | ICD-10-CM | POA: Diagnosis not present

## 2017-11-23 DIAGNOSIS — I10 Essential (primary) hypertension: Secondary | ICD-10-CM | POA: Diagnosis not present

## 2017-11-23 DIAGNOSIS — J189 Pneumonia, unspecified organism: Secondary | ICD-10-CM | POA: Diagnosis not present

## 2017-11-23 MED ORDER — METOPROLOL TARTRATE 25 MG PO TABS: 25.0000 mg | ORAL_TABLET | Freq: Two times a day (BID) | ORAL | Status: AC

## 2017-11-23 MED ORDER — CEFDINIR 300 MG PO CAPS: 300.0000 mg | ORAL_CAPSULE | Freq: Two times a day (BID) | ORAL | 0 refills | Status: AC

## 2017-11-23 NOTE — Progress Notes (Signed)
Report called to Terry at [facility]. Patient with no complaints at the current time. Discharge instructions given to patients daughter and verbalizes understanding

## 2017-11-23 NOTE — Clinical Social Work Placement (Addendum)
   CLINICAL SOCIAL WORK PLACEMENT  NOTE DISCHARGED TO [facility]. TRANSPORTED BY [facility]  Date:  11/23/2017  Patient Details  Name: Laura Knight MRN: [id number] Date of Birth: Sep 17, 1924  Clinical Social Work is seeking post-discharge placement for this patient at the [place] level of care (*CSW will initial, date and re-position this form in  chart as items are completed):  Yes   Patient/family provided with [facility] Work Department's list of facilities offering this level of care within the geographic area requested by the patient (or if unable, by the patient's family).  Yes   Patient/family informed of their freedom to choose among providers that offer the needed level of care, that participate in Medicare, Medicaid or managed care program needed by the patient, have an available bed and are willing to accept the patient.  Yes   Patient/family informed of [facility]'s ownership interest in [facility] and [facility], as well as of the fact that they are under no obligation to receive care at these facilities.  PASRR submitted to EDS on       PASRR number received on       Existing PASRR number confirmed on       FL2 transmitted to all facilities in geographic area requested by pt/family on       FL2 transmitted to all facilities within larger geographic area on 11/23/17     Patient informed that his/her managed care company has contracts with or will negotiate with certain facilities, including the following:        Yes   Patient/family informed of bed offers received.  Patient chooses bed at [facility]     Physician recommends and patient chooses bed at [facility]    Patient to be transferred to [facility] on 11/23/17.  Patient to be transferred to facility by [facility]     Patient family notified on 11/23/17 of transfer.  Name of family member notified:  Marta Lamas (daughter)      PHYSICIAN       Additional Comment:     _______________________________________________ Ernesto Rutherford, [place] [facility] 11/23/2017, 1:25 PM

## 2017-11-23 NOTE — Consult Note (Signed)
   [facility] CM Inpatient Consult   11/23/2017  Laura Knight 1925/01/13 [id number]   Patient screened for potential [facility] services for disease or care management. Patient is in the [place] [facility] under patient's Medicare plan. Patient admitted with CAP. Patient is from [place] and she is currently to transition to skilled nursing care at [facility].  No community follow up needs identified.    For questions contact:   Natividad Brood, RN BSN [facility] Liaison  [phone] business mobile phone Toll free office [phone]

## 2017-11-23 NOTE — Discharge Summary (Signed)
Physician Discharge Summary  Laura Knight MPN:[id number] DOB: 05-11-1925 DOA: 11/17/2017  PCP: Leeroy Cha, MD  Admit date: 11/17/2017 Discharge date: 11/23/2017  Time spent: 35 minutes  Recommendations for Outpatient Follow-up:  1. Complete Antibiotics  2. Follow-up with physician   Discharge Diagnoses:  Principal Problem:   HCAP (healthcare-associated pneumonia) Active Problems:   AAA (abdominal aortic aneurysm) without rupture (HCC)   Aortic valve stenosis   Frequent falls   Benign essential HTN   Discharge Condition: Good  Diet recommendation: Heart healthy  Filed Weights   11/18/17 2044 11/21/17 2053 11/22/17 2103  Weight: 58.5 kg (129 lb) 58.4 kg (128 lb 12 oz) 58.6 kg (129 lb 3 oz)    History of present illness:  Laura Devillersis a 84 y.o.femalewithhistory of abdominal aortic aneurysm brought to the ER because of recurrent falls. This is the fourth visit to the ER in the last 1 week. During the last visit patient was found to have pneumonia and was discharged on antibiotics. Despite taking which patient was still having persistent cough with shortness of breath. As per the patient's son patient has had been having falls but not sure if she lost consciousness. Did not hit her head. Denies any chest pain. Patient was admitted in December 2018 4 months ago for second-degree AV block at the time patient refused pacemaker. She is now weak, tired and has chest X ray showing left lung base pneumonia.     Hospital Course:  Patient was admitted and started initially on vancomycin with cefepime for healthcare associated pneumonia. Her MRSA screen was negative so vancomycin was discontinued. Patient was later transitioned to oral Omnicef which she tolerated well. She will complete treatment at the skilled facility. She has had multiple episodes of arrhythmias but is known to have second-degree AV block and patient has refused pacemaker in the past. She is a DO  NOT RESUSCITATE. She has had generalized weakness with gait abnormalities and has had multiple falls prior to admission. For that reason PT and OT consult was ordered. Patient is now recommended for skilled nursing facility. She is going to [place].  Procedures:  None  Consultations:  Physical therapy and social work  Discharge Exam: Vitals:   11/22/17 2103 11/23/17 0636  BP: 134/67 129/79  Pulse: 64 78  Resp: 19 20  Temp: 98.6 F (37 C) 98.2 F (36.8 C)  SpO2: 100% 100%    General: Stable, NAD Cardiovascular: RRR Respiratory: Good AE bilaterally, no wheeze, no rales  Discharge Instructions   Discharge Instructions    Diet - low sodium heart healthy   Complete by:  As directed    Increase activity slowly   Complete by:  As directed      Allergies as of 11/23/2017      Reactions   Amoxicillin Other (See Comments)   Unknown reaction Has patient had a PCN reaction causing immediate rash, facial/tongue/throat swelling, SOB or lightheadedness with hypotension: No Has patient had a PCN reaction causing severe rash involving mucus membranes or skin necrosis: Unknown Has patient had a PCN reaction that required hospitalization: Unknown Has patient had a PCN reaction occurring within the last 10 years: Unknown If all of the above answers are "NO", then may proceed with Cephalosporin use.   Sulfa Antibiotics Hives, Rash      Medication List    STOP taking these medications   doxycycline 100 MG capsule Commonly known as:  VIBRAMYCIN     TAKE these medications   acetaminophen 325 MG  tablet Commonly known as:  TYLENOL Take 325 mg by mouth every 4 (four) hours as needed for mild pain, fever or headache.   cefdinir 300 MG capsule Commonly known as:  OMNICEF Take 1 capsule (300 mg total) by mouth every 12 (twelve) hours for 5 days.   diphenhydramine-acetaminophen 25-500 MG Tabs tablet Commonly known as:  TYLENOL PM Take 1 tablet by mouth at bedtime.   docusate sodium  100 MG capsule Commonly known as:  COLACE Take 100 mg by mouth daily as needed for mild constipation.   guaiFENesin 600 MG 12 hr tablet Commonly known as:  MUCINEX Take 2 tablets (1,200 mg total) by mouth 2 (two) times daily. What changed:    when to take this  reasons to take this   guaiFENesin-codeine 100-10 MG/5ML syrup Commonly known as:  ROBITUSSIN AC Take 10 mLs by mouth 2 (two) times daily as needed for cough.   loratadine 10 MG tablet Commonly known as:  CLARITIN Take 10 mg by mouth daily as needed for allergies.   Melatonin 3 MG Tabs Take 3 mg by mouth at bedtime as needed (sleep).   metoprolol tartrate 25 MG tablet Commonly known as:  LOPRESSOR Take 1 tablet (25 mg total) by mouth 2 (two) times daily.   MULTI-DAY VITAMINS PO Take 1 tablet by mouth daily.   predniSONE 10 MG tablet Commonly known as:  DELTASONE Take 7.5 mg by mouth daily.   Vitamin D (Ergocalciferol) 50000 units Caps capsule Commonly known as:  DRISDOL Take 50,000 Units by mouth every 7 (seven) days.      Allergies  Allergen Reactions  . Amoxicillin Other (See Comments)    Unknown reaction Has patient had a PCN reaction causing immediate rash, facial/tongue/throat swelling, SOB or lightheadedness with hypotension: No Has patient had a PCN reaction causing severe rash involving mucus membranes or skin necrosis: Unknown Has patient had a PCN reaction that required hospitalization: Unknown Has patient had a PCN reaction occurring within the last 10 years: Unknown If all of the above answers are "NO", then may proceed with Cephalosporin use.   . Sulfa Antibiotics Hives and Rash      The results of significant diagnostics from this hospitalization (including imaging, microbiology, ancillary and laboratory) are listed below for reference.    Significant Diagnostic Studies: Dg Chest 2 View  Result Date: 11/17/2017 CLINICAL DATA:  84 y/o  F; unwitnessed fall.  Cough. EXAM: CHEST - 2 VIEW  COMPARISON:  11/16/2017 chest radiograph. FINDINGS: Stable cardiomegaly given projection and technique. Aortic atherosclerosis with calcification. Stable ill-defined opacification of left lung base. No pleural effusion or pneumothorax. Healing left 5-7 rib fractures with callus. No new displaced rib fracture identified. IMPRESSION: 1. Stable ill-defined opacity in left lung base which may represent atelectasis or pneumonia. 2. Stable healing left 5-7 rib fractures. No acute fracture identified. 3. Stable cardiomegaly and aortic atherosclerosis. Electronically Signed   By: Kristine Garbe M.D.   On: 11/17/2017 23:41   Dg Chest 2 View  Result Date: 11/16/2017 CLINICAL DATA:  84 year old female status post fall while walking to bathroom. Was on the floor for over an hour. EXAM: CHEST - 2 VIEW COMPARISON:  Chest and rib series 09/11/2017 and earlier. FINDINGS: Semi upright AP and lateral views of the chest. Decreased left lung volume since January with patchy left lung base opacity. Healing left posterior 5th through 7th rib fractures. Stable cardiomegaly and mediastinal contours. Calcified aortic atherosclerosis. Visualized tracheal air column is within normal limits. No  pneumothorax or pulmonary edema. No pleural effusion identified. The right lung remains clear. Negative visible bowel gas pattern. No new osseous abnormality identified. IMPRESSION: 1. Healing posterior left 5th through 7th rib fractures. 2. Decreased left lung volume since January with patchy left lung base opacity which is probably atelectasis. Left lung base infection would be difficult to exclude. 3. Stable cardiomegaly.  Aortic Atherosclerosis (ICD10-I70.0). Electronically Signed   By: Genevie Ann M.D.   On: 11/16/2017 19:23   Ct Head Wo Contrast  Result Date: 11/18/2017 CLINICAL DATA:  Multiple falls.  Ataxia. EXAM: CT HEAD WITHOUT CONTRAST TECHNIQUE: Contiguous axial images were obtained from the base of the skull through the vertex  without intravenous contrast. COMPARISON:  None. FINDINGS: Brain: No evidence of acute infarction, hemorrhage, hydrocephalus, extra-axial collection or mass lesion/mass effect. Prominence of the sulci and ventricles compatible with age related brain atrophy. There is diffuse low-attenuation within the subcortical and periventricular white matter compatible with chronic microvascular disease. Vascular: No hyperdense vessel or unexpected calcification. Skull: Normal. Negative for fracture or focal lesion. Sinuses/Orbits: No acute finding. Other: None. IMPRESSION: 1. No acute intracranial abnormalities identified. 2. Chronic small vessel ischemic disease and brain atrophy. Electronically Signed   By: Kerby Moors M.D.   On: 11/18/2017 08:18   Dg Hip Unilat W Or Wo Pelvis 2-3 Views Right  Result Date: 11/17/2017 CLINICAL DATA:  Post unwitnessed fall. EXAM: DG HIP (WITH OR WITHOUT PELVIS) 2-3V RIGHT COMPARISON:  06/16/2017 FINDINGS: Post bilateral total hip arthroplasty. There is no evidence of fracture. The orthopedic components are normally aligned. Healing or healed bilateral superior pubic ramus fractures. IMPRESSION: No evidence of acute fracture. Healing or healed bilateral superior pubic rami fractures. Electronically Signed   By: Fidela Salisbury M.D.   On: 11/17/2017 23:41    Microbiology: Recent Results (from the past 240 hour(s))  Urine culture     Status: None   Collection Time: 11/18/17  1:02 PM  Result Value Ref Range Status   Specimen Description URINE, RANDOM  Final   Special Requests NONE  Final   Culture   Final    NO GROWTH Performed at [facility], [address], [place], [place] [address]    Report Status 11/19/2017 FINAL  Final  MRSA PCR Screening     Status: None   Collection Time: 11/18/17 11:44 PM  Result Value Ref Range Status   MRSA by PCR NEGATIVE NEGATIVE Final    Comment:        The GeneXpert MRSA Assay (FDA approved for NASAL specimens only), is one  component of a comprehensive MRSA colonization surveillance program. It is not intended to diagnose MRSA infection nor to guide or monitor treatment for MRSA infections. Performed at [facility], [place] [address], [place], [place] [address]      Labs: Basic Metabolic Panel: Recent Labs  Lab 11/17/17 2228 11/18/17 0409 11/19/17 1128  NA 135 135 134*  K 3.4* 3.0* 3.6  CL 101 100* 99*  CO2 21* 23 23  GLUCOSE 109* 111* 123*  BUN 16 15 22*  CREATININE 0.89 0.83 0.89  CALCIUM 8.5* 8.3* 8.5*   Liver Function Tests: No results for input(s): AST, ALT, ALKPHOS, BILITOT, PROT, ALBUMIN in the last 168 hours. No results for input(s): LIPASE, AMYLASE in the last 168 hours. No results for input(s): AMMONIA in the last 168 hours. CBC: Recent Labs  Lab 11/17/17 2228 11/18/17 0409  WBC 10.9* 9.3  NEUTROABS 8.6*  --  HGB 12.0 11.0*  HCT 37.3 34.6*  MCV 92.3 92.0  PLT 237 223   Cardiac Enzymes: No results for input(s): CKTOTAL, CKMB, CKMBINDEX, TROPONINI in the last 168 hours. BNP: BNP (last 3 results) Recent Labs    08/14/17 0800  BNP 316.7*    ProBNP (last 3 results) No results for input(s): PROBNP in the last 8760 hours.  CBG: No results for input(s): GLUCAP in the last 168 hours.     SignedBarbette Merino MD.  [facility] 11/23/2017, 8:18 AM

## 2017-11-23 NOTE — Progress Notes (Signed)
Physical Therapy Treatment Patient Details Name: Laura Knight MRN: [id number] DOB: 11-23-1924 Today's Date: 11/23/2017    History of Present Illness Laura Knight is a 84 y.o. female with history of abdominal aortic aneurysm brought to the ER because of recurrent falls.  This is the fourth visit to the ER in the last 1 week.  During the last visit patient was found to have pneumonia and was discharged on antibiotics.  Despite taking which patient was still having persistent cough with shortness of breath.  As per the patient's son patient has had been having falls but not sure if she lost consciousness.  Did not hit her head.  Denies any chest pain.  Patient was admitted in December 2018 4 months ago for second-degree AV block at the time patient refused pacemaker.    PT Comments    Pt making fair progress this session but remains limited secondary to cognitive deficits and poor safety awareness. Pt would continue to benefit from skilled physical therapy services at this time while admitted and after d/c to address the below listed limitations in order to improve overall safety and independence with functional mobility.    Follow Up Recommendations  SNF;Supervision/Assistance - 24 hour     Equipment Recommendations  None recommended by PT    Recommendations for Other Services       Precautions / Restrictions Precautions Precautions: Fall Restrictions Weight Bearing Restrictions: No    Mobility  Bed Mobility               General bed mobility comments: pt sitting on BSC with daugther and nurse tech present upon arrival  Transfers Overall transfer level: Needs assistance Equipment used: Rolling walker (2 wheeled) Transfers: Sit to/from Stand Sit to Stand: Min assist;+2 safety/equipment;+2 physical assistance         General transfer comment: increased time and effort, cueing for safe hand placement, assist for stability with rise into standing x1 from [facility] and x1  from recliner chair  Ambulation/Gait Ambulation/Gait assistance: Min assist;+2 safety/equipment;+2 physical assistance Ambulation Distance (Feet): 65 Feet(25' x1, 40' x1 with sitting rest break in between) Assistive device: Rolling walker (2 wheeled) Gait Pattern/deviations: Step-through pattern;Shuffle;Trunk flexed;Decreased step length - right Gait velocity: decreased Gait velocity interpretation: Below normal speed for age/gender General Gait Details: pt required constant cueing for improved posture, forward eye gaze and to re-direct attention to task. Pt required constant assist for stability and safety as well as close chair follow   Stairs            Wheelchair Mobility    Modified Rankin (Stroke Patients Only)       Balance Overall balance assessment: Needs assistance Sitting-balance support: Single extremity supported Sitting balance-Leahy Scale: Poor     Standing balance support: Bilateral upper extremity supported Standing balance-Leahy Scale: Poor                              Cognition Arousal/Alertness: Awake/alert Behavior During Therapy: WFL for tasks assessed/performed Overall Cognitive Status: Impaired/Different from baseline Area of Impairment: Memory;Following commands;Attention;Safety/judgement;Problem solving;Orientation                 Orientation Level: Disoriented to;Person;Place;Situation;Time Current Attention Level: Focused Memory: Decreased short-term memory Following Commands: Follows one step commands consistently Safety/Judgement: Decreased awareness of safety;Decreased awareness of deficits   Problem Solving: Difficulty sequencing;Requires verbal cues;Requires tactile cues        Exercises  General Comments        Pertinent Vitals/Pain Pain Assessment: No/denies pain    Home Living                      Prior Function            PT Goals (current goals can now be found in the care plan  section) Acute Rehab PT Goals PT Goal Formulation: With patient Time For Goal Achievement: 12/02/17 Potential to Achieve Goals: Fair Progress towards PT goals: Progressing toward goals    Frequency    Min 2X/week      PT Plan Current plan remains appropriate    Co-evaluation              AM-PAC PT "6 Clicks" Daily Activity  Outcome Measure  Difficulty turning over in bed (including adjusting bedclothes, sheets and blankets)?: Unable Difficulty moving from lying on back to sitting on the side of the bed? : Unable Difficulty sitting down on and standing up from a chair with arms (e.g., wheelchair, bedside commode, etc,.)?: Unable Help needed moving to and from a bed to chair (including a wheelchair)?: A Little Help needed walking in hospital room?: A Lot Help needed climbing 3-5 steps with a railing? : Total 6 Click Score: 9    End of Session Equipment Utilized During Treatment: Gait belt Activity Tolerance: Patient tolerated treatment well Patient left: in chair;with call bell/phone within reach;with chair alarm set Nurse Communication: Mobility status PT Visit Diagnosis: Muscle weakness (generalized) (M62.81);Repeated falls (R29.6);Other abnormalities of gait and mobility (R26.89)     Time: 3254-9826 PT Time Calculation (min) (ACUTE ONLY): 24 min  Charges:  $Therapeutic Activity: 23-37 mins                    G Codes:       [place], [place], [place] 11/23/2017, 2:59 PM

## 2017-11-23 NOTE — Progress Notes (Signed)
Case reviewed for LOS; B Early Steel RN,MHA,BSN [phone] 

## 2017-11-28 DIAGNOSIS — I10 Essential (primary) hypertension: Secondary | ICD-10-CM | POA: Diagnosis not present

## 2017-11-28 DIAGNOSIS — R4189 Other symptoms and signs involving cognitive functions and awareness: Secondary | ICD-10-CM | POA: Diagnosis not present

## 2017-11-28 DIAGNOSIS — J189 Pneumonia, unspecified organism: Secondary | ICD-10-CM | POA: Diagnosis not present

## 2017-11-28 DIAGNOSIS — R54 Age-related physical debility: Secondary | ICD-10-CM | POA: Diagnosis not present

## 2017-12-01 DIAGNOSIS — R0781 Pleurodynia: Secondary | ICD-10-CM | POA: Diagnosis not present

## 2017-12-01 DIAGNOSIS — W19XXXD Unspecified fall, subsequent encounter: Secondary | ICD-10-CM | POA: Diagnosis not present

## 2017-12-01 DIAGNOSIS — J181 Lobar pneumonia, unspecified organism: Secondary | ICD-10-CM | POA: Diagnosis not present

## 2017-12-07 DIAGNOSIS — R05 Cough: Secondary | ICD-10-CM | POA: Diagnosis not present

## 2017-12-08 DIAGNOSIS — R05 Cough: Secondary | ICD-10-CM | POA: Diagnosis not present

## 2017-12-08 DIAGNOSIS — J189 Pneumonia, unspecified organism: Secondary | ICD-10-CM | POA: Diagnosis not present

## 2017-12-12 DIAGNOSIS — R197 Diarrhea, unspecified: Secondary | ICD-10-CM | POA: Diagnosis not present

## 2017-12-12 DIAGNOSIS — J189 Pneumonia, unspecified organism: Secondary | ICD-10-CM | POA: Diagnosis not present

## 2017-12-13 ENCOUNTER — Ambulatory Visit: Payer: Medicare Other | Admitting: Physician Assistant

## 2017-12-15 DIAGNOSIS — R001 Bradycardia, unspecified: Secondary | ICD-10-CM | POA: Diagnosis not present

## 2017-12-15 DIAGNOSIS — F329 Major depressive disorder, single episode, unspecified: Secondary | ICD-10-CM | POA: Diagnosis not present

## 2017-12-15 DIAGNOSIS — I10 Essential (primary) hypertension: Secondary | ICD-10-CM | POA: Diagnosis not present

## 2017-12-18 DIAGNOSIS — R451 Restlessness and agitation: Secondary | ICD-10-CM | POA: Diagnosis not present

## 2017-12-18 DIAGNOSIS — R197 Diarrhea, unspecified: Secondary | ICD-10-CM | POA: Diagnosis not present

## 2017-12-25 DIAGNOSIS — J3489 Other specified disorders of nose and nasal sinuses: Secondary | ICD-10-CM | POA: Diagnosis not present

## 2017-12-25 DIAGNOSIS — R05 Cough: Secondary | ICD-10-CM | POA: Diagnosis not present

## 2017-12-28 DIAGNOSIS — F329 Major depressive disorder, single episode, unspecified: Secondary | ICD-10-CM | POA: Diagnosis not present

## 2017-12-28 DIAGNOSIS — W19XXXA Unspecified fall, initial encounter: Secondary | ICD-10-CM | POA: Diagnosis not present

## 2017-12-28 DIAGNOSIS — R3915 Urgency of urination: Secondary | ICD-10-CM | POA: Diagnosis not present

## 2018-01-01 DIAGNOSIS — R296 Repeated falls: Secondary | ICD-10-CM | POA: Diagnosis not present

## 2018-01-01 DIAGNOSIS — R05 Cough: Secondary | ICD-10-CM | POA: Diagnosis not present

## 2018-01-01 DIAGNOSIS — R4189 Other symptoms and signs involving cognitive functions and awareness: Secondary | ICD-10-CM | POA: Diagnosis not present

## 2018-01-02 DIAGNOSIS — K529 Noninfective gastroenteritis and colitis, unspecified: Secondary | ICD-10-CM | POA: Diagnosis not present

## 2018-01-02 DIAGNOSIS — F329 Major depressive disorder, single episode, unspecified: Secondary | ICD-10-CM | POA: Diagnosis not present

## 2018-01-02 DIAGNOSIS — I10 Essential (primary) hypertension: Secondary | ICD-10-CM | POA: Diagnosis not present

## 2018-01-02 DIAGNOSIS — R05 Cough: Secondary | ICD-10-CM | POA: Diagnosis not present

## 2018-01-05 DIAGNOSIS — I10 Essential (primary) hypertension: Secondary | ICD-10-CM | POA: Diagnosis not present

## 2018-01-05 DIAGNOSIS — M6281 Muscle weakness (generalized): Secondary | ICD-10-CM | POA: Diagnosis not present

## 2018-01-05 DIAGNOSIS — R296 Repeated falls: Secondary | ICD-10-CM | POA: Diagnosis not present

## 2018-01-05 DIAGNOSIS — I714 Abdominal aortic aneurysm, without rupture: Secondary | ICD-10-CM | POA: Diagnosis not present

## 2018-01-05 DIAGNOSIS — R262 Difficulty in walking, not elsewhere classified: Secondary | ICD-10-CM | POA: Diagnosis not present

## 2018-01-05 DIAGNOSIS — I350 Nonrheumatic aortic (valve) stenosis: Secondary | ICD-10-CM | POA: Diagnosis not present

## 2018-01-09 DIAGNOSIS — R296 Repeated falls: Secondary | ICD-10-CM | POA: Diagnosis not present

## 2018-01-09 DIAGNOSIS — M6281 Muscle weakness (generalized): Secondary | ICD-10-CM | POA: Diagnosis not present

## 2018-01-09 DIAGNOSIS — R262 Difficulty in walking, not elsewhere classified: Secondary | ICD-10-CM | POA: Diagnosis not present

## 2018-01-09 DIAGNOSIS — I714 Abdominal aortic aneurysm, without rupture: Secondary | ICD-10-CM | POA: Diagnosis not present

## 2018-01-09 DIAGNOSIS — I350 Nonrheumatic aortic (valve) stenosis: Secondary | ICD-10-CM | POA: Diagnosis not present

## 2018-01-09 DIAGNOSIS — I10 Essential (primary) hypertension: Secondary | ICD-10-CM | POA: Diagnosis not present

## 2018-01-11 DIAGNOSIS — I350 Nonrheumatic aortic (valve) stenosis: Secondary | ICD-10-CM | POA: Diagnosis not present

## 2018-01-11 DIAGNOSIS — I10 Essential (primary) hypertension: Secondary | ICD-10-CM | POA: Diagnosis not present

## 2018-01-11 DIAGNOSIS — R262 Difficulty in walking, not elsewhere classified: Secondary | ICD-10-CM | POA: Diagnosis not present

## 2018-01-11 DIAGNOSIS — R296 Repeated falls: Secondary | ICD-10-CM | POA: Diagnosis not present

## 2018-01-11 DIAGNOSIS — I714 Abdominal aortic aneurysm, without rupture: Secondary | ICD-10-CM | POA: Diagnosis not present

## 2018-01-11 DIAGNOSIS — M6281 Muscle weakness (generalized): Secondary | ICD-10-CM | POA: Diagnosis not present

## 2018-01-14 DIAGNOSIS — I10 Essential (primary) hypertension: Secondary | ICD-10-CM | POA: Diagnosis not present

## 2018-01-14 DIAGNOSIS — M6281 Muscle weakness (generalized): Secondary | ICD-10-CM | POA: Diagnosis not present

## 2018-01-14 DIAGNOSIS — R262 Difficulty in walking, not elsewhere classified: Secondary | ICD-10-CM | POA: Diagnosis not present

## 2018-01-14 DIAGNOSIS — I350 Nonrheumatic aortic (valve) stenosis: Secondary | ICD-10-CM | POA: Diagnosis not present

## 2018-01-14 DIAGNOSIS — I714 Abdominal aortic aneurysm, without rupture: Secondary | ICD-10-CM | POA: Diagnosis not present

## 2018-01-14 DIAGNOSIS — R296 Repeated falls: Secondary | ICD-10-CM | POA: Diagnosis not present

## 2018-01-15 DIAGNOSIS — I519 Heart disease, unspecified: Secondary | ICD-10-CM | POA: Diagnosis not present

## 2018-01-15 DIAGNOSIS — J189 Pneumonia, unspecified organism: Secondary | ICD-10-CM | POA: Diagnosis not present

## 2018-01-15 DIAGNOSIS — R350 Frequency of micturition: Secondary | ICD-10-CM | POA: Diagnosis not present

## 2018-01-15 DIAGNOSIS — I350 Nonrheumatic aortic (valve) stenosis: Secondary | ICD-10-CM | POA: Diagnosis not present

## 2018-01-15 DIAGNOSIS — G3184 Mild cognitive impairment, so stated: Secondary | ICD-10-CM | POA: Diagnosis not present

## 2018-01-15 DIAGNOSIS — R05 Cough: Secondary | ICD-10-CM | POA: Diagnosis not present

## 2018-01-15 DIAGNOSIS — I10 Essential (primary) hypertension: Secondary | ICD-10-CM | POA: Diagnosis not present

## 2018-01-15 DIAGNOSIS — I4430 Unspecified atrioventricular block: Secondary | ICD-10-CM | POA: Diagnosis not present

## 2018-01-15 DIAGNOSIS — I714 Abdominal aortic aneurysm, without rupture: Secondary | ICD-10-CM | POA: Diagnosis not present

## 2018-01-16 DIAGNOSIS — I714 Abdominal aortic aneurysm, without rupture: Secondary | ICD-10-CM | POA: Diagnosis not present

## 2018-01-16 DIAGNOSIS — R296 Repeated falls: Secondary | ICD-10-CM | POA: Diagnosis not present

## 2018-01-16 DIAGNOSIS — M6281 Muscle weakness (generalized): Secondary | ICD-10-CM | POA: Diagnosis not present

## 2018-01-16 DIAGNOSIS — R262 Difficulty in walking, not elsewhere classified: Secondary | ICD-10-CM | POA: Diagnosis not present

## 2018-01-16 DIAGNOSIS — I350 Nonrheumatic aortic (valve) stenosis: Secondary | ICD-10-CM | POA: Diagnosis not present

## 2018-01-16 DIAGNOSIS — I10 Essential (primary) hypertension: Secondary | ICD-10-CM | POA: Diagnosis not present

## 2018-01-17 DIAGNOSIS — I10 Essential (primary) hypertension: Secondary | ICD-10-CM | POA: Diagnosis not present

## 2018-01-17 DIAGNOSIS — M6281 Muscle weakness (generalized): Secondary | ICD-10-CM | POA: Diagnosis not present

## 2018-01-17 DIAGNOSIS — R296 Repeated falls: Secondary | ICD-10-CM | POA: Diagnosis not present

## 2018-01-17 DIAGNOSIS — R262 Difficulty in walking, not elsewhere classified: Secondary | ICD-10-CM | POA: Diagnosis not present

## 2018-01-17 DIAGNOSIS — I350 Nonrheumatic aortic (valve) stenosis: Secondary | ICD-10-CM | POA: Diagnosis not present

## 2018-01-17 DIAGNOSIS — I714 Abdominal aortic aneurysm, without rupture: Secondary | ICD-10-CM | POA: Diagnosis not present

## 2018-01-18 DIAGNOSIS — M6281 Muscle weakness (generalized): Secondary | ICD-10-CM | POA: Diagnosis not present

## 2018-01-18 DIAGNOSIS — I10 Essential (primary) hypertension: Secondary | ICD-10-CM | POA: Diagnosis not present

## 2018-01-18 DIAGNOSIS — I714 Abdominal aortic aneurysm, without rupture: Secondary | ICD-10-CM | POA: Diagnosis not present

## 2018-01-18 DIAGNOSIS — R262 Difficulty in walking, not elsewhere classified: Secondary | ICD-10-CM | POA: Diagnosis not present

## 2018-01-18 DIAGNOSIS — R296 Repeated falls: Secondary | ICD-10-CM | POA: Diagnosis not present

## 2018-01-18 DIAGNOSIS — I350 Nonrheumatic aortic (valve) stenosis: Secondary | ICD-10-CM | POA: Diagnosis not present

## 2018-01-18 DIAGNOSIS — Z79899 Other long term (current) drug therapy: Secondary | ICD-10-CM | POA: Diagnosis not present

## 2018-01-19 DIAGNOSIS — R296 Repeated falls: Secondary | ICD-10-CM | POA: Diagnosis not present

## 2018-01-19 DIAGNOSIS — M6281 Muscle weakness (generalized): Secondary | ICD-10-CM | POA: Diagnosis not present

## 2018-01-19 DIAGNOSIS — I350 Nonrheumatic aortic (valve) stenosis: Secondary | ICD-10-CM | POA: Diagnosis not present

## 2018-01-19 DIAGNOSIS — R262 Difficulty in walking, not elsewhere classified: Secondary | ICD-10-CM | POA: Diagnosis not present

## 2018-01-19 DIAGNOSIS — I10 Essential (primary) hypertension: Secondary | ICD-10-CM | POA: Diagnosis not present

## 2018-01-19 DIAGNOSIS — I714 Abdominal aortic aneurysm, without rupture: Secondary | ICD-10-CM | POA: Diagnosis not present

## 2018-01-22 DIAGNOSIS — R296 Repeated falls: Secondary | ICD-10-CM | POA: Diagnosis not present

## 2018-01-22 DIAGNOSIS — I714 Abdominal aortic aneurysm, without rupture: Secondary | ICD-10-CM | POA: Diagnosis not present

## 2018-01-22 DIAGNOSIS — R262 Difficulty in walking, not elsewhere classified: Secondary | ICD-10-CM | POA: Diagnosis not present

## 2018-01-22 DIAGNOSIS — M6281 Muscle weakness (generalized): Secondary | ICD-10-CM | POA: Diagnosis not present

## 2018-01-22 DIAGNOSIS — I350 Nonrheumatic aortic (valve) stenosis: Secondary | ICD-10-CM | POA: Diagnosis not present

## 2018-01-22 DIAGNOSIS — I10 Essential (primary) hypertension: Secondary | ICD-10-CM | POA: Diagnosis not present

## 2018-01-22 DIAGNOSIS — I4430 Unspecified atrioventricular block: Secondary | ICD-10-CM | POA: Diagnosis not present

## 2018-01-22 DIAGNOSIS — G3184 Mild cognitive impairment, so stated: Secondary | ICD-10-CM | POA: Diagnosis not present

## 2018-01-23 DIAGNOSIS — I714 Abdominal aortic aneurysm, without rupture: Secondary | ICD-10-CM | POA: Diagnosis not present

## 2018-01-23 DIAGNOSIS — I350 Nonrheumatic aortic (valve) stenosis: Secondary | ICD-10-CM | POA: Diagnosis not present

## 2018-01-23 DIAGNOSIS — M6281 Muscle weakness (generalized): Secondary | ICD-10-CM | POA: Diagnosis not present

## 2018-01-23 DIAGNOSIS — R296 Repeated falls: Secondary | ICD-10-CM | POA: Diagnosis not present

## 2018-01-23 DIAGNOSIS — R262 Difficulty in walking, not elsewhere classified: Secondary | ICD-10-CM | POA: Diagnosis not present

## 2018-01-23 DIAGNOSIS — I10 Essential (primary) hypertension: Secondary | ICD-10-CM | POA: Diagnosis not present

## 2018-01-25 DIAGNOSIS — R262 Difficulty in walking, not elsewhere classified: Secondary | ICD-10-CM | POA: Diagnosis not present

## 2018-01-25 DIAGNOSIS — I10 Essential (primary) hypertension: Secondary | ICD-10-CM | POA: Diagnosis not present

## 2018-01-25 DIAGNOSIS — M6281 Muscle weakness (generalized): Secondary | ICD-10-CM | POA: Diagnosis not present

## 2018-01-25 DIAGNOSIS — I350 Nonrheumatic aortic (valve) stenosis: Secondary | ICD-10-CM | POA: Diagnosis not present

## 2018-01-25 DIAGNOSIS — R296 Repeated falls: Secondary | ICD-10-CM | POA: Diagnosis not present

## 2018-01-25 DIAGNOSIS — I714 Abdominal aortic aneurysm, without rupture: Secondary | ICD-10-CM | POA: Diagnosis not present

## 2018-01-26 DIAGNOSIS — I714 Abdominal aortic aneurysm, without rupture: Secondary | ICD-10-CM | POA: Diagnosis not present

## 2018-01-26 DIAGNOSIS — I350 Nonrheumatic aortic (valve) stenosis: Secondary | ICD-10-CM | POA: Diagnosis not present

## 2018-01-26 DIAGNOSIS — I10 Essential (primary) hypertension: Secondary | ICD-10-CM | POA: Diagnosis not present

## 2018-01-26 DIAGNOSIS — R296 Repeated falls: Secondary | ICD-10-CM | POA: Diagnosis not present

## 2018-01-26 DIAGNOSIS — I4430 Unspecified atrioventricular block: Secondary | ICD-10-CM | POA: Diagnosis not present

## 2018-01-26 DIAGNOSIS — M6281 Muscle weakness (generalized): Secondary | ICD-10-CM | POA: Diagnosis not present

## 2018-01-26 DIAGNOSIS — G3184 Mild cognitive impairment, so stated: Secondary | ICD-10-CM | POA: Diagnosis not present

## 2018-01-26 DIAGNOSIS — R262 Difficulty in walking, not elsewhere classified: Secondary | ICD-10-CM | POA: Diagnosis not present

## 2018-01-26 DIAGNOSIS — H6123 Impacted cerumen, bilateral: Secondary | ICD-10-CM | POA: Diagnosis not present

## 2018-01-29 DIAGNOSIS — I350 Nonrheumatic aortic (valve) stenosis: Secondary | ICD-10-CM | POA: Diagnosis not present

## 2018-01-29 DIAGNOSIS — I10 Essential (primary) hypertension: Secondary | ICD-10-CM | POA: Diagnosis not present

## 2018-01-29 DIAGNOSIS — R262 Difficulty in walking, not elsewhere classified: Secondary | ICD-10-CM | POA: Diagnosis not present

## 2018-01-29 DIAGNOSIS — I714 Abdominal aortic aneurysm, without rupture: Secondary | ICD-10-CM | POA: Diagnosis not present

## 2018-01-29 DIAGNOSIS — R296 Repeated falls: Secondary | ICD-10-CM | POA: Diagnosis not present

## 2018-01-29 DIAGNOSIS — M6281 Muscle weakness (generalized): Secondary | ICD-10-CM | POA: Diagnosis not present

## 2018-01-30 DIAGNOSIS — I350 Nonrheumatic aortic (valve) stenosis: Secondary | ICD-10-CM | POA: Diagnosis not present

## 2018-01-30 DIAGNOSIS — I714 Abdominal aortic aneurysm, without rupture: Secondary | ICD-10-CM | POA: Diagnosis not present

## 2018-01-30 DIAGNOSIS — R262 Difficulty in walking, not elsewhere classified: Secondary | ICD-10-CM | POA: Diagnosis not present

## 2018-01-30 DIAGNOSIS — I10 Essential (primary) hypertension: Secondary | ICD-10-CM | POA: Diagnosis not present

## 2018-01-30 DIAGNOSIS — R296 Repeated falls: Secondary | ICD-10-CM | POA: Diagnosis not present

## 2018-01-30 DIAGNOSIS — M6281 Muscle weakness (generalized): Secondary | ICD-10-CM | POA: Diagnosis not present

## 2018-01-31 DIAGNOSIS — I10 Essential (primary) hypertension: Secondary | ICD-10-CM | POA: Diagnosis not present

## 2018-01-31 DIAGNOSIS — I714 Abdominal aortic aneurysm, without rupture: Secondary | ICD-10-CM | POA: Diagnosis not present

## 2018-01-31 DIAGNOSIS — R296 Repeated falls: Secondary | ICD-10-CM | POA: Diagnosis not present

## 2018-01-31 DIAGNOSIS — I350 Nonrheumatic aortic (valve) stenosis: Secondary | ICD-10-CM | POA: Diagnosis not present

## 2018-01-31 DIAGNOSIS — R262 Difficulty in walking, not elsewhere classified: Secondary | ICD-10-CM | POA: Diagnosis not present

## 2018-01-31 DIAGNOSIS — M6281 Muscle weakness (generalized): Secondary | ICD-10-CM | POA: Diagnosis not present

## 2018-02-01 DIAGNOSIS — I350 Nonrheumatic aortic (valve) stenosis: Secondary | ICD-10-CM | POA: Diagnosis not present

## 2018-02-01 DIAGNOSIS — I714 Abdominal aortic aneurysm, without rupture: Secondary | ICD-10-CM | POA: Diagnosis not present

## 2018-02-01 DIAGNOSIS — M6281 Muscle weakness (generalized): Secondary | ICD-10-CM | POA: Diagnosis not present

## 2018-02-01 DIAGNOSIS — I10 Essential (primary) hypertension: Secondary | ICD-10-CM | POA: Diagnosis not present

## 2018-02-01 DIAGNOSIS — R296 Repeated falls: Secondary | ICD-10-CM | POA: Diagnosis not present

## 2018-02-01 DIAGNOSIS — R262 Difficulty in walking, not elsewhere classified: Secondary | ICD-10-CM | POA: Diagnosis not present

## 2018-02-05 DIAGNOSIS — M810 Age-related osteoporosis without current pathological fracture: Secondary | ICD-10-CM | POA: Diagnosis not present

## 2018-02-05 DIAGNOSIS — I10 Essential (primary) hypertension: Secondary | ICD-10-CM | POA: Diagnosis not present

## 2018-02-05 DIAGNOSIS — J449 Chronic obstructive pulmonary disease, unspecified: Secondary | ICD-10-CM | POA: Diagnosis not present

## 2018-02-05 DIAGNOSIS — J020 Streptococcal pharyngitis: Secondary | ICD-10-CM | POA: Diagnosis not present

## 2018-02-06 DIAGNOSIS — F0391 Unspecified dementia with behavioral disturbance: Secondary | ICD-10-CM | POA: Diagnosis not present

## 2018-02-07 DIAGNOSIS — R262 Difficulty in walking, not elsewhere classified: Secondary | ICD-10-CM | POA: Diagnosis not present

## 2018-02-07 DIAGNOSIS — M6281 Muscle weakness (generalized): Secondary | ICD-10-CM | POA: Diagnosis not present

## 2018-02-07 DIAGNOSIS — I714 Abdominal aortic aneurysm, without rupture: Secondary | ICD-10-CM | POA: Diagnosis not present

## 2018-02-07 DIAGNOSIS — R296 Repeated falls: Secondary | ICD-10-CM | POA: Diagnosis not present

## 2018-02-07 DIAGNOSIS — I350 Nonrheumatic aortic (valve) stenosis: Secondary | ICD-10-CM | POA: Diagnosis not present

## 2018-02-07 DIAGNOSIS — I10 Essential (primary) hypertension: Secondary | ICD-10-CM | POA: Diagnosis not present

## 2018-02-08 DIAGNOSIS — I10 Essential (primary) hypertension: Secondary | ICD-10-CM | POA: Diagnosis not present

## 2018-02-08 DIAGNOSIS — I714 Abdominal aortic aneurysm, without rupture: Secondary | ICD-10-CM | POA: Diagnosis not present

## 2018-02-08 DIAGNOSIS — R262 Difficulty in walking, not elsewhere classified: Secondary | ICD-10-CM | POA: Diagnosis not present

## 2018-02-08 DIAGNOSIS — M6281 Muscle weakness (generalized): Secondary | ICD-10-CM | POA: Diagnosis not present

## 2018-02-08 DIAGNOSIS — H6123 Impacted cerumen, bilateral: Secondary | ICD-10-CM | POA: Diagnosis not present

## 2018-02-08 DIAGNOSIS — R296 Repeated falls: Secondary | ICD-10-CM | POA: Diagnosis not present

## 2018-02-08 DIAGNOSIS — I350 Nonrheumatic aortic (valve) stenosis: Secondary | ICD-10-CM | POA: Diagnosis not present

## 2018-02-09 DIAGNOSIS — M6281 Muscle weakness (generalized): Secondary | ICD-10-CM | POA: Diagnosis not present

## 2018-02-09 DIAGNOSIS — R262 Difficulty in walking, not elsewhere classified: Secondary | ICD-10-CM | POA: Diagnosis not present

## 2018-02-09 DIAGNOSIS — I10 Essential (primary) hypertension: Secondary | ICD-10-CM | POA: Diagnosis not present

## 2018-02-09 DIAGNOSIS — I714 Abdominal aortic aneurysm, without rupture: Secondary | ICD-10-CM | POA: Diagnosis not present

## 2018-02-09 DIAGNOSIS — R296 Repeated falls: Secondary | ICD-10-CM | POA: Diagnosis not present

## 2018-02-09 DIAGNOSIS — I350 Nonrheumatic aortic (valve) stenosis: Secondary | ICD-10-CM | POA: Diagnosis not present

## 2018-02-13 DIAGNOSIS — R296 Repeated falls: Secondary | ICD-10-CM | POA: Diagnosis not present

## 2018-02-13 DIAGNOSIS — R262 Difficulty in walking, not elsewhere classified: Secondary | ICD-10-CM | POA: Diagnosis not present

## 2018-02-13 DIAGNOSIS — I10 Essential (primary) hypertension: Secondary | ICD-10-CM | POA: Diagnosis not present

## 2018-02-13 DIAGNOSIS — I714 Abdominal aortic aneurysm, without rupture: Secondary | ICD-10-CM | POA: Diagnosis not present

## 2018-02-13 DIAGNOSIS — M6281 Muscle weakness (generalized): Secondary | ICD-10-CM | POA: Diagnosis not present

## 2018-02-13 DIAGNOSIS — I350 Nonrheumatic aortic (valve) stenosis: Secondary | ICD-10-CM | POA: Diagnosis not present

## 2018-02-14 DIAGNOSIS — I714 Abdominal aortic aneurysm, without rupture: Secondary | ICD-10-CM | POA: Diagnosis not present

## 2018-02-14 DIAGNOSIS — I10 Essential (primary) hypertension: Secondary | ICD-10-CM | POA: Diagnosis not present

## 2018-02-14 DIAGNOSIS — I350 Nonrheumatic aortic (valve) stenosis: Secondary | ICD-10-CM | POA: Diagnosis not present

## 2018-02-14 DIAGNOSIS — M6281 Muscle weakness (generalized): Secondary | ICD-10-CM | POA: Diagnosis not present

## 2018-02-14 DIAGNOSIS — R296 Repeated falls: Secondary | ICD-10-CM | POA: Diagnosis not present

## 2018-02-14 DIAGNOSIS — R262 Difficulty in walking, not elsewhere classified: Secondary | ICD-10-CM | POA: Diagnosis not present

## 2018-02-15 DIAGNOSIS — I714 Abdominal aortic aneurysm, without rupture: Secondary | ICD-10-CM | POA: Diagnosis not present

## 2018-02-15 DIAGNOSIS — I10 Essential (primary) hypertension: Secondary | ICD-10-CM | POA: Diagnosis not present

## 2018-02-15 DIAGNOSIS — R296 Repeated falls: Secondary | ICD-10-CM | POA: Diagnosis not present

## 2018-02-15 DIAGNOSIS — I350 Nonrheumatic aortic (valve) stenosis: Secondary | ICD-10-CM | POA: Diagnosis not present

## 2018-02-15 DIAGNOSIS — M6281 Muscle weakness (generalized): Secondary | ICD-10-CM | POA: Diagnosis not present

## 2018-02-15 DIAGNOSIS — R262 Difficulty in walking, not elsewhere classified: Secondary | ICD-10-CM | POA: Diagnosis not present

## 2018-02-20 DIAGNOSIS — I10 Essential (primary) hypertension: Secondary | ICD-10-CM | POA: Diagnosis not present

## 2018-02-20 DIAGNOSIS — I714 Abdominal aortic aneurysm, without rupture: Secondary | ICD-10-CM | POA: Diagnosis not present

## 2018-02-20 DIAGNOSIS — M6281 Muscle weakness (generalized): Secondary | ICD-10-CM | POA: Diagnosis not present

## 2018-02-20 DIAGNOSIS — R296 Repeated falls: Secondary | ICD-10-CM | POA: Diagnosis not present

## 2018-02-20 DIAGNOSIS — I350 Nonrheumatic aortic (valve) stenosis: Secondary | ICD-10-CM | POA: Diagnosis not present

## 2018-02-20 DIAGNOSIS — R262 Difficulty in walking, not elsewhere classified: Secondary | ICD-10-CM | POA: Diagnosis not present

## 2018-02-27 DIAGNOSIS — R262 Difficulty in walking, not elsewhere classified: Secondary | ICD-10-CM | POA: Diagnosis not present

## 2018-02-27 DIAGNOSIS — M6281 Muscle weakness (generalized): Secondary | ICD-10-CM | POA: Diagnosis not present

## 2018-02-27 DIAGNOSIS — I350 Nonrheumatic aortic (valve) stenosis: Secondary | ICD-10-CM | POA: Diagnosis not present

## 2018-02-27 DIAGNOSIS — I714 Abdominal aortic aneurysm, without rupture: Secondary | ICD-10-CM | POA: Diagnosis not present

## 2018-02-27 DIAGNOSIS — R296 Repeated falls: Secondary | ICD-10-CM | POA: Diagnosis not present

## 2018-02-27 DIAGNOSIS — I10 Essential (primary) hypertension: Secondary | ICD-10-CM | POA: Diagnosis not present

## 2018-03-01 DIAGNOSIS — I10 Essential (primary) hypertension: Secondary | ICD-10-CM | POA: Diagnosis not present

## 2018-03-01 DIAGNOSIS — R296 Repeated falls: Secondary | ICD-10-CM | POA: Diagnosis not present

## 2018-03-01 DIAGNOSIS — I714 Abdominal aortic aneurysm, without rupture: Secondary | ICD-10-CM | POA: Diagnosis not present

## 2018-03-01 DIAGNOSIS — M6281 Muscle weakness (generalized): Secondary | ICD-10-CM | POA: Diagnosis not present

## 2018-03-01 DIAGNOSIS — I350 Nonrheumatic aortic (valve) stenosis: Secondary | ICD-10-CM | POA: Diagnosis not present

## 2018-03-01 DIAGNOSIS — R262 Difficulty in walking, not elsewhere classified: Secondary | ICD-10-CM | POA: Diagnosis not present

## 2018-03-12 DIAGNOSIS — E876 Hypokalemia: Secondary | ICD-10-CM | POA: Diagnosis not present

## 2018-03-12 DIAGNOSIS — D6489 Other specified anemias: Secondary | ICD-10-CM | POA: Diagnosis not present

## 2018-03-12 DIAGNOSIS — R001 Bradycardia, unspecified: Secondary | ICD-10-CM | POA: Diagnosis not present

## 2018-03-12 DIAGNOSIS — R4189 Other symptoms and signs involving cognitive functions and awareness: Secondary | ICD-10-CM | POA: Diagnosis not present

## 2018-03-13 DIAGNOSIS — F0391 Unspecified dementia with behavioral disturbance: Secondary | ICD-10-CM | POA: Diagnosis not present

## 2018-03-13 DIAGNOSIS — D649 Anemia, unspecified: Secondary | ICD-10-CM | POA: Diagnosis not present

## 2018-03-13 DIAGNOSIS — E876 Hypokalemia: Secondary | ICD-10-CM | POA: Diagnosis not present

## 2018-03-13 DIAGNOSIS — K529 Noninfective gastroenteritis and colitis, unspecified: Secondary | ICD-10-CM | POA: Diagnosis not present

## 2018-03-15 ENCOUNTER — Other Ambulatory Visit: Payer: Self-pay

## 2018-03-15 ENCOUNTER — Emergency Department (HOSPITAL_COMMUNITY)
Admission: EM | Admit: 2018-03-15 | Discharge: 2018-03-15 | Disposition: A | Discharge status: Skilled Nursing Facility | Payer: Medicare Other | Attending: Emergency Medicine | Admitting: Emergency Medicine

## 2018-03-15 ENCOUNTER — Encounter (HOSPITAL_COMMUNITY): Payer: Self-pay

## 2018-03-15 ENCOUNTER — Emergency Department (HOSPITAL_COMMUNITY): Payer: Medicare Other

## 2018-03-15 DIAGNOSIS — W19XXXA Unspecified fall, initial encounter: Secondary | ICD-10-CM | POA: Diagnosis not present

## 2018-03-15 DIAGNOSIS — Y92121 Bathroom in nursing home as the place of occurrence of the external cause: Secondary | ICD-10-CM | POA: Diagnosis not present

## 2018-03-15 DIAGNOSIS — Y93E8 Activity, other personal hygiene: Secondary | ICD-10-CM | POA: Diagnosis not present

## 2018-03-15 DIAGNOSIS — R0789 Other chest pain: Secondary | ICD-10-CM | POA: Diagnosis not present

## 2018-03-15 DIAGNOSIS — Y999 Unspecified external cause status: Secondary | ICD-10-CM | POA: Diagnosis not present

## 2018-03-15 DIAGNOSIS — Z79899 Other long term (current) drug therapy: Secondary | ICD-10-CM | POA: Diagnosis not present

## 2018-03-15 DIAGNOSIS — Z96643 Presence of artificial hip joint, bilateral: Secondary | ICD-10-CM | POA: Diagnosis not present

## 2018-03-15 DIAGNOSIS — Z043 Encounter for examination and observation following other accident: Secondary | ICD-10-CM | POA: Diagnosis present

## 2018-03-15 DIAGNOSIS — R001 Bradycardia, unspecified: Secondary | ICD-10-CM | POA: Diagnosis not present

## 2018-03-15 DIAGNOSIS — I10 Essential (primary) hypertension: Secondary | ICD-10-CM | POA: Diagnosis not present

## 2018-03-15 DIAGNOSIS — M25511 Pain in right shoulder: Secondary | ICD-10-CM | POA: Insufficient documentation

## 2018-03-15 DIAGNOSIS — W1811XA Fall from or off toilet without subsequent striking against object, initial encounter: Secondary | ICD-10-CM | POA: Diagnosis not present

## 2018-03-15 DIAGNOSIS — S299XXA Unspecified injury of thorax, initial encounter: Secondary | ICD-10-CM | POA: Diagnosis not present

## 2018-03-15 DIAGNOSIS — S20211A Contusion of right front wall of thorax, initial encounter: Secondary | ICD-10-CM

## 2018-03-15 DIAGNOSIS — R52 Pain, unspecified: Secondary | ICD-10-CM | POA: Diagnosis not present

## 2018-03-15 MED ORDER — TRAMADOL-ACETAMINOPHEN 37.5-325 MG PO TABS
1.0000 | ORAL_TABLET | Freq: Once | ORAL | Status: AC
  Administered 2018-03-15: 1 via ORAL
  Filled 2018-03-15: qty 1

## 2018-03-15 NOTE — ED Notes (Signed)
Pt son at bedside.

## 2018-03-15 NOTE — ED Notes (Signed)
Bed: [facility] Expected date:  Expected time:  Means of arrival:  Comments: No Bed 

## 2018-03-15 NOTE — ED Notes (Signed)
Pt and family updated on plan of care  

## 2018-03-15 NOTE — ED Notes (Signed)
Patient transported to X-ray 

## 2018-03-15 NOTE — ED Triage Notes (Addendum)
Pt from [facility] via EMS d/t fall. Per EMS pt fell on her right side when trying to get off the toilet. Pt has hx of falls. Pt currently c/o pain 10/10 to right side. No noted brusing, skin intact. Full ROM RUE and RLE.  Pt reports she did not hit her head.

## 2018-03-15 NOTE — Discharge Instructions (Addendum)
The best treatment for your pain is Tylenol 650 mg every 4 hours.  Also try using ice on the sore spots 3 or 4 times a day for 30 minutes.  Continue ice therapy for 3 days.  After that use heat several times each day to help the discomfort.  Try to avoid falling, and get help when you stand up, if needed.

## 2018-03-15 NOTE — ED Notes (Signed)
Placed Pt on [place] catheter with RN. Pericare was performed prior to placement.

## 2018-03-15 NOTE — ED Notes (Addendum)
Xray called about status of Xray. Per xray: Main radiology doing all xrays, radiology backed up

## 2018-03-15 NOTE — ED Notes (Signed)
Report called to [facility] and the facility.

## 2018-03-15 NOTE — ED Provider Notes (Signed)
[facility] DEPT Provider Note   CSN: [id number] Arrival date & time: 03/15/18  1516     History   Chief Complaint Chief Complaint  Patient presents with  . Fall    HPI Laura Knight is a 84 y.o. female.  HPI   She presents for injuries after falling from commode, to her right chest wall and shoulder.  She was able to get up with assistance of caregivers at her nursing facility.  She denies injury to head, neck or back.  She is here with her son who helps to give history and states that she is at her baseline mental status.  Patient feels like she slipped off the commode, and there was no presyncope or syncope.  She has been well recently and denies problems with nausea, vomiting, weakness or dizziness.  There are no other known modifying factors.  Past Medical History:  Diagnosis Date  . Acute bilateral low back pain without sciatica   . Aftercare following surgery of the musculoskeletal system   . Arthritis   . Benign essential HTN 11/20/2017  . Contusion of left hip    initial encounter  . GERD (gastroesophageal reflux disease)   . History of blood transfusion yrs ago  . Left hip pain   . Osteoarthritis of left hip    unspecified osteoarthritis type  . 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 Status post bilateral hip replacements   . Trochanteric bursitis, right hip     Patient Active Problem List   Diagnosis Date Noted  . Benign essential HTN 11/20/2017  . HCAP (healthcare-associated pneumonia) 11/18/2017  . Frequent falls   . Mobitz type 2 second degree AV block 08/14/2017  . AAA (abdominal aortic aneurysm) without rupture ([place]) 08/04/2017  . Aortic valve stenosis 08/04/2017  . Esophageal reflux 07/09/2013  . Constipation 06/18/2013  . Osteoarthritis of left hip S/P Left Total Hip Arthroplasty 06/18/2013  . Expected blood loss anemia 06/05/2013  . S/P left THA, AA 06/04/2013    Past Surgical History:    Procedure Laterality Date  . ABDOMINAL HYSTERECTOMY     1 ovary removed  . bilateral knee replacment  2008  . JOINT REPLACEMENT    . right hip replacement  1977  . TOTAL HIP ARTHROPLASTY Left 06/04/2013   Procedure: LEFT TOTAL HIP ARTHROPLASTY ANTERIOR APPROACH;  Surgeon: Mauri Pole, MD;  Location: [facility];  Service: Orthopedics;  Laterality: Left;     OB History   None      Home Medications    Prior to Admission medications   Medication Sig Start Date End Date Taking? Authorizing Provider  acetaminophen (TYLENOL) 325 MG tablet Take 325 mg by mouth every 4 (four) hours as needed for mild pain, fever or headache.   Yes [provider]  Dextromethorphan-guaiFENesin (GERI-TUSSIN DM) 10-100 MG/5ML liquid Take 10 mLs by mouth 2 (two) times daily as needed (cough).   Yes [provider]  diphenhydramine-acetaminophen (TYLENOL PM) 25-500 MG TABS tablet Take 1 tablet by mouth at bedtime.   Yes [provider]  docusate sodium (COLACE) 100 MG capsule Take 100 mg by mouth daily as needed for mild constipation.   Yes [provider]  guaiFENesin (MUCINEX) 600 MG 12 hr tablet Take 2 tablets (1,200 mg total) by mouth 2 (two) times daily. Patient taking differently: Take 1,200 mg by mouth 2 (two) times daily as needed for to loosen phlegm.  08/15/17  Yes Raiford Noble  Latif, DO  ipratropium-albuterol (DUONEB) 0.5-2.5 (3) MG/3ML SOLN Take 3 mLs by nebulization.   Yes [provider]  lisinopril (PRINIVIL,ZESTRIL) 20 MG tablet Take 20 mg by mouth daily.   Yes [provider]  loperamide (IMODIUM) 2 MG capsule Take 4 mg by mouth as needed for diarrhea or loose stools.   Yes [provider]  Melatonin 3 MG TABS Take 3 mg by mouth at bedtime as needed (sleep).   Yes [provider]  Multiple Vitamin (MULTI-DAY VITAMINS PO) Take 1 tablet by mouth daily.   Yes [provider]  predniSONE (DELTASONE) 10 MG tablet Take 10  mg by mouth daily.  06/08/17  Yes [provider]  Skin Protectants, Misc. (DIMETHICONE-ZINC OXIDE) cream Apply 1 application topically 3 (three) times daily.   Yes [provider]  Vitamin D, Ergocalciferol, (DRISDOL) 50000 units CAPS capsule Take 50,000 Units by mouth every 7 (seven) days.   Yes [provider]  Loratadine 10 MG CAPS Take 10 mg by mouth daily.    [provider]  metoprolol tartrate (LOPRESSOR) 25 MG tablet Take 1 tablet (25 mg total) by mouth 2 (two) times daily. Patient not taking: Reported on 03/15/2018 11/23/17   Elwyn Reach, MD    Family History Family History  Problem Relation Age of Onset  . Aortic stenosis Son     Social History Social History   Tobacco Use  . Smoking status: Former Smoker    Packs/day: 0.50    Years: 12.00    Pack years: 6.00    Types: Cigarettes  . Smokeless tobacco: Never Used  . Tobacco comment: quit smoking age 54  Substance Use Topics  . Alcohol use: Yes    Comment: occasional wine  . Drug use: No     Allergies   Amoxicillin; Ativan [lorazepam]; and Sulfa antibiotics   Review of Systems Review of Systems  All other systems reviewed and are negative.    Physical Exam Updated Vital Signs BP (!) 166/70   Pulse 73   Temp 97.9 F (36.6 C) (Oral)   Resp 16   Ht 5\' 4"  (1.626 m)   Wt 56.7 kg (125 lb)   SpO2 97%   BMI 21.46 kg/m   Physical Exam  Constitutional: She is oriented to person, place, and time. She appears well-developed. She appears distressed (Uncomfortable).  Elderly, frail  HENT:  Head: Normocephalic and atraumatic.  Eyes: Pupils are equal, round, and reactive to light. Conjunctivae and EOM are normal.  Neck: Normal range of motion and phonation normal. Neck supple.  Cardiovascular: Normal rate and regular rhythm.  Pulmonary/Chest: Effort normal and breath sounds normal. She exhibits tenderness (Diffuse right posterior lateral chest wall tenderness without  crepitation or deformity.).  Abdominal: Soft. She exhibits no distension. There is no tenderness. There is no guarding.  Musculoskeletal: Normal range of motion.  Normal range of motion arms and legs with normal strength both.  Mild tenderness right scapula without crepitation or deformity.  Neurological: She is alert and oriented to person, place, and time. She exhibits normal muscle tone.  Skin: Skin is warm and dry.  Psychiatric: She has a normal mood and affect. Her behavior is normal.  Nursing note and vitals reviewed.    ED Treatments / Results  Labs (all labs ordered are listed, but only abnormal results are displayed) Labs Reviewed - No data to display  EKG None  Radiology Dg Ribs Unilateral W/chest Right  Result Date: 03/15/2018 CLINICAL DATA:  Fall with right-sided chest pain EXAM: RIGHT RIBS AND CHEST - 3+ VIEW COMPARISON:  11/17/2017 FINDINGS: Single-view chest demonstrates cardiomegaly. No acute airspace opacity or pleural effusion. No pneumothorax. Aortic atherosclerosis. Right rib series demonstrates no acute displaced right rib fracture IMPRESSION: 1. Cardiomegaly.  Negative for pneumothorax or pleural effusion 2. No acute displaced right rib fracture. Electronically Signed   By: Donavan Foil M.D.   On: 03/15/2018 19:30   Dg Shoulder Right  Result Date: 03/15/2018 CLINICAL DATA:  Pain EXAM: RIGHT SHOULDER - 2+ VIEW COMPARISON:  None. FINDINGS: Degenerative changes in the [facility] joint with joint space narrowing and spurring. Glenohumeral joint is maintained. No acute bony abnormality. Specifically, no fracture, subluxation, or dislocation. Soft tissues are intact. IMPRESSION: Degenerative changes in the right AC joint. No acute bony abnormality. Electronically Signed   By: Rolm Baptise M.D.   On: 03/15/2018 19:28    Procedures Procedures (including critical care time)  Medications Ordered in ED Medications  traMADol-acetaminophen (ULTRACET) 37.5-325 MG per tablet 1  tablet (1 tablet Oral Given 03/15/18 1946)     Initial Impression / Assessment and Plan / ED Course  I have reviewed the triage vital signs and the nursing notes.  Pertinent labs & imaging results that were available during my care of the patient were reviewed by me and considered in my medical decision making (see chart for details).      Patient Vitals for the past 24 hrs:  BP Temp Temp src Pulse Resp SpO2 Height Weight  03/15/18 1830 (!) 166/70 - - 73 - 97 % - -  03/15/18 1754 (!) 166/75 - - (!) 55 16 94 % - -  03/15/18 1745 (!) 166/75 - - (!) 52 - 94 % - -  03/15/18 1700 (!) 176/71 - - 74 - 96 % - -  03/15/18 1645 - - - 78 - 96 % - -  03/15/18 1630 - - - 72 - 94 % - -  03/15/18 1615 (!) 163/70 - - 73 - 96 % - -  03/15/18 1600 - - - 76 - 96 % - -  03/15/18 1545 - - - (!) 38 - 94 % - -  03/15/18 1537 - - - - - - 5\' 4"  (1.626 m) 56.7 kg (125 lb)  03/15/18 1532 - - - - - 96 % - -  03/15/18 1530 (!) 175/80 - - (!) 44 - 95 % - -  03/15/18 1522 (!) 179/57 97.9 F (36.6 C) Oral 84 18 96 % - -    8:03 PM Reevaluation with update and discussion. After initial assessment and treatment, an updated evaluation reveals no change in status.  Pain was treated with ultrasound here.  Findings discussed with son, regarding treatment for this type of injury, Tylenol is the best choice.  All questions answered.Daleen Bo   Medical Decision Making: Fall likely mechanical without serious injury.  Doubt fracture, intrathoracic contusion, head or spine injury.  CRITICAL CARE-no Performed by: Daleen Bo   Nursing Notes Reviewed/ Care Coordinated Applicable Imaging Reviewed Interpretation of Laboratory Data incorporated into ED treatment  The patient appears reasonably screened and/or stabilized for discharge and I doubt any other medical condition or other [facility] requiring further screening, evaluation, or treatment in the ED at this time prior to discharge.  Plan: Home Medications-continue  usual medications, use Tylenol for pain; Home Treatments-rest, gradually strength activity; return here if the recommended treatment, does not improve the symptoms; Recommended follow up-PCP as needed.  Final Clinical Impressions(s) / ED Diagnoses   Final diagnoses:  Fall, initial encounter  Contusion of right chest wall, initial encounter    ED Discharge Orders    None       Daleen Bo, MD 03/17/18 [phone]

## 2018-03-16 DIAGNOSIS — R269 Unspecified abnormalities of gait and mobility: Secondary | ICD-10-CM | POA: Diagnosis not present

## 2018-03-16 DIAGNOSIS — M65811 Other synovitis and tenosynovitis, right shoulder: Secondary | ICD-10-CM | POA: Diagnosis not present

## 2018-03-16 DIAGNOSIS — E876 Hypokalemia: Secondary | ICD-10-CM | POA: Diagnosis not present

## 2018-03-16 DIAGNOSIS — R4189 Other symptoms and signs involving cognitive functions and awareness: Secondary | ICD-10-CM | POA: Diagnosis not present

## 2018-03-26 DIAGNOSIS — F0390 Unspecified dementia without behavioral disturbance: Secondary | ICD-10-CM | POA: Diagnosis not present

## 2018-03-26 DIAGNOSIS — R269 Unspecified abnormalities of gait and mobility: Secondary | ICD-10-CM | POA: Diagnosis not present

## 2018-03-26 DIAGNOSIS — R531 Weakness: Secondary | ICD-10-CM | POA: Diagnosis not present

## 2018-03-26 DIAGNOSIS — R001 Bradycardia, unspecified: Secondary | ICD-10-CM | POA: Diagnosis not present

## 2018-04-02 DIAGNOSIS — F0391 Unspecified dementia with behavioral disturbance: Secondary | ICD-10-CM | POA: Diagnosis not present

## 2018-04-02 DIAGNOSIS — R269 Unspecified abnormalities of gait and mobility: Secondary | ICD-10-CM | POA: Diagnosis not present

## 2018-04-02 DIAGNOSIS — R001 Bradycardia, unspecified: Secondary | ICD-10-CM | POA: Diagnosis not present

## 2018-04-02 DIAGNOSIS — L909 Atrophic disorder of skin, unspecified: Secondary | ICD-10-CM | POA: Diagnosis not present

## 2018-04-04 DIAGNOSIS — L89152 Pressure ulcer of sacral region, stage 2: Secondary | ICD-10-CM | POA: Diagnosis not present

## 2018-04-04 DIAGNOSIS — I441 Atrioventricular block, second degree: Secondary | ICD-10-CM | POA: Diagnosis not present

## 2018-04-04 DIAGNOSIS — I350 Nonrheumatic aortic (valve) stenosis: Secondary | ICD-10-CM | POA: Diagnosis not present

## 2018-04-04 DIAGNOSIS — I2510 Atherosclerotic heart disease of native coronary artery without angina pectoris: Secondary | ICD-10-CM | POA: Diagnosis not present

## 2018-04-04 DIAGNOSIS — I714 Abdominal aortic aneurysm, without rupture: Secondary | ICD-10-CM | POA: Diagnosis not present

## 2018-04-04 DIAGNOSIS — Z87891 Personal history of nicotine dependence: Secondary | ICD-10-CM | POA: Diagnosis not present

## 2018-04-04 DIAGNOSIS — F0391 Unspecified dementia with behavioral disturbance: Secondary | ICD-10-CM | POA: Diagnosis not present

## 2018-04-04 DIAGNOSIS — Z9181 History of falling: Secondary | ICD-10-CM | POA: Diagnosis not present

## 2018-04-04 DIAGNOSIS — I10 Essential (primary) hypertension: Secondary | ICD-10-CM | POA: Diagnosis not present

## 2018-04-09 DIAGNOSIS — R001 Bradycardia, unspecified: Secondary | ICD-10-CM | POA: Diagnosis not present

## 2018-04-09 DIAGNOSIS — R4189 Other symptoms and signs involving cognitive functions and awareness: Secondary | ICD-10-CM | POA: Diagnosis not present

## 2018-04-09 DIAGNOSIS — R627 Adult failure to thrive: Secondary | ICD-10-CM | POA: Diagnosis not present

## 2018-04-09 DIAGNOSIS — R05 Cough: Secondary | ICD-10-CM | POA: Diagnosis not present

## 2018-04-10 DIAGNOSIS — I429 Cardiomyopathy, unspecified: Secondary | ICD-10-CM | POA: Diagnosis not present

## 2018-04-10 DIAGNOSIS — I10 Essential (primary) hypertension: Secondary | ICD-10-CM | POA: Diagnosis not present

## 2018-04-10 DIAGNOSIS — I442 Atrioventricular block, complete: Secondary | ICD-10-CM | POA: Diagnosis not present

## 2018-04-10 DIAGNOSIS — J449 Chronic obstructive pulmonary disease, unspecified: Secondary | ICD-10-CM | POA: Diagnosis not present

## 2018-04-10 DIAGNOSIS — I359 Nonrheumatic aortic valve disorder, unspecified: Secondary | ICD-10-CM | POA: Diagnosis not present

## 2018-04-10 DIAGNOSIS — F0150 Vascular dementia without behavioral disturbance: Secondary | ICD-10-CM | POA: Diagnosis not present

## 2018-04-10 DIAGNOSIS — K219 Gastro-esophageal reflux disease without esophagitis: Secondary | ICD-10-CM | POA: Diagnosis not present

## 2018-04-10 DIAGNOSIS — J301 Allergic rhinitis due to pollen: Secondary | ICD-10-CM | POA: Diagnosis not present

## 2018-04-10 DIAGNOSIS — I519 Heart disease, unspecified: Secondary | ICD-10-CM | POA: Diagnosis not present

## 2018-04-11 DIAGNOSIS — I442 Atrioventricular block, complete: Secondary | ICD-10-CM | POA: Diagnosis not present

## 2018-04-11 DIAGNOSIS — I429 Cardiomyopathy, unspecified: Secondary | ICD-10-CM | POA: Diagnosis not present

## 2018-04-11 DIAGNOSIS — F0150 Vascular dementia without behavioral disturbance: Secondary | ICD-10-CM | POA: Diagnosis not present

## 2018-04-11 DIAGNOSIS — I10 Essential (primary) hypertension: Secondary | ICD-10-CM | POA: Diagnosis not present

## 2018-04-11 DIAGNOSIS — I519 Heart disease, unspecified: Secondary | ICD-10-CM | POA: Diagnosis not present

## 2018-04-11 DIAGNOSIS — I359 Nonrheumatic aortic valve disorder, unspecified: Secondary | ICD-10-CM | POA: Diagnosis not present

## 2018-04-12 DIAGNOSIS — I442 Atrioventricular block, complete: Secondary | ICD-10-CM | POA: Diagnosis not present

## 2018-04-12 DIAGNOSIS — F0150 Vascular dementia without behavioral disturbance: Secondary | ICD-10-CM | POA: Diagnosis not present

## 2018-04-12 DIAGNOSIS — I519 Heart disease, unspecified: Secondary | ICD-10-CM | POA: Diagnosis not present

## 2018-04-12 DIAGNOSIS — I10 Essential (primary) hypertension: Secondary | ICD-10-CM | POA: Diagnosis not present

## 2018-04-12 DIAGNOSIS — I429 Cardiomyopathy, unspecified: Secondary | ICD-10-CM | POA: Diagnosis not present

## 2018-04-12 DIAGNOSIS — I359 Nonrheumatic aortic valve disorder, unspecified: Secondary | ICD-10-CM | POA: Diagnosis not present

## 2018-04-13 DIAGNOSIS — I359 Nonrheumatic aortic valve disorder, unspecified: Secondary | ICD-10-CM | POA: Diagnosis not present

## 2018-04-13 DIAGNOSIS — I10 Essential (primary) hypertension: Secondary | ICD-10-CM | POA: Diagnosis not present

## 2018-04-13 DIAGNOSIS — I519 Heart disease, unspecified: Secondary | ICD-10-CM | POA: Diagnosis not present

## 2018-04-13 DIAGNOSIS — F0391 Unspecified dementia with behavioral disturbance: Secondary | ICD-10-CM | POA: Diagnosis not present

## 2018-04-13 DIAGNOSIS — F0150 Vascular dementia without behavioral disturbance: Secondary | ICD-10-CM | POA: Diagnosis not present

## 2018-04-13 DIAGNOSIS — L89152 Pressure ulcer of sacral region, stage 2: Secondary | ICD-10-CM | POA: Diagnosis not present

## 2018-04-13 DIAGNOSIS — I429 Cardiomyopathy, unspecified: Secondary | ICD-10-CM | POA: Diagnosis not present

## 2018-04-13 DIAGNOSIS — I442 Atrioventricular block, complete: Secondary | ICD-10-CM | POA: Diagnosis not present

## 2018-04-22 DEATH — deceased

## 2019-10-30 IMAGING — DX DG CHEST 1V PORT
1 series · 1 of 1 positions shown · non-contrast
Comparison: Portable exam 7329 hours compared to 10/04/2016

CLINICAL DATA: Cough and shortness of breath for 3 days, history
GERD, former smoker

EXAM:
PORTABLE CHEST 1 VIEW

[chest]
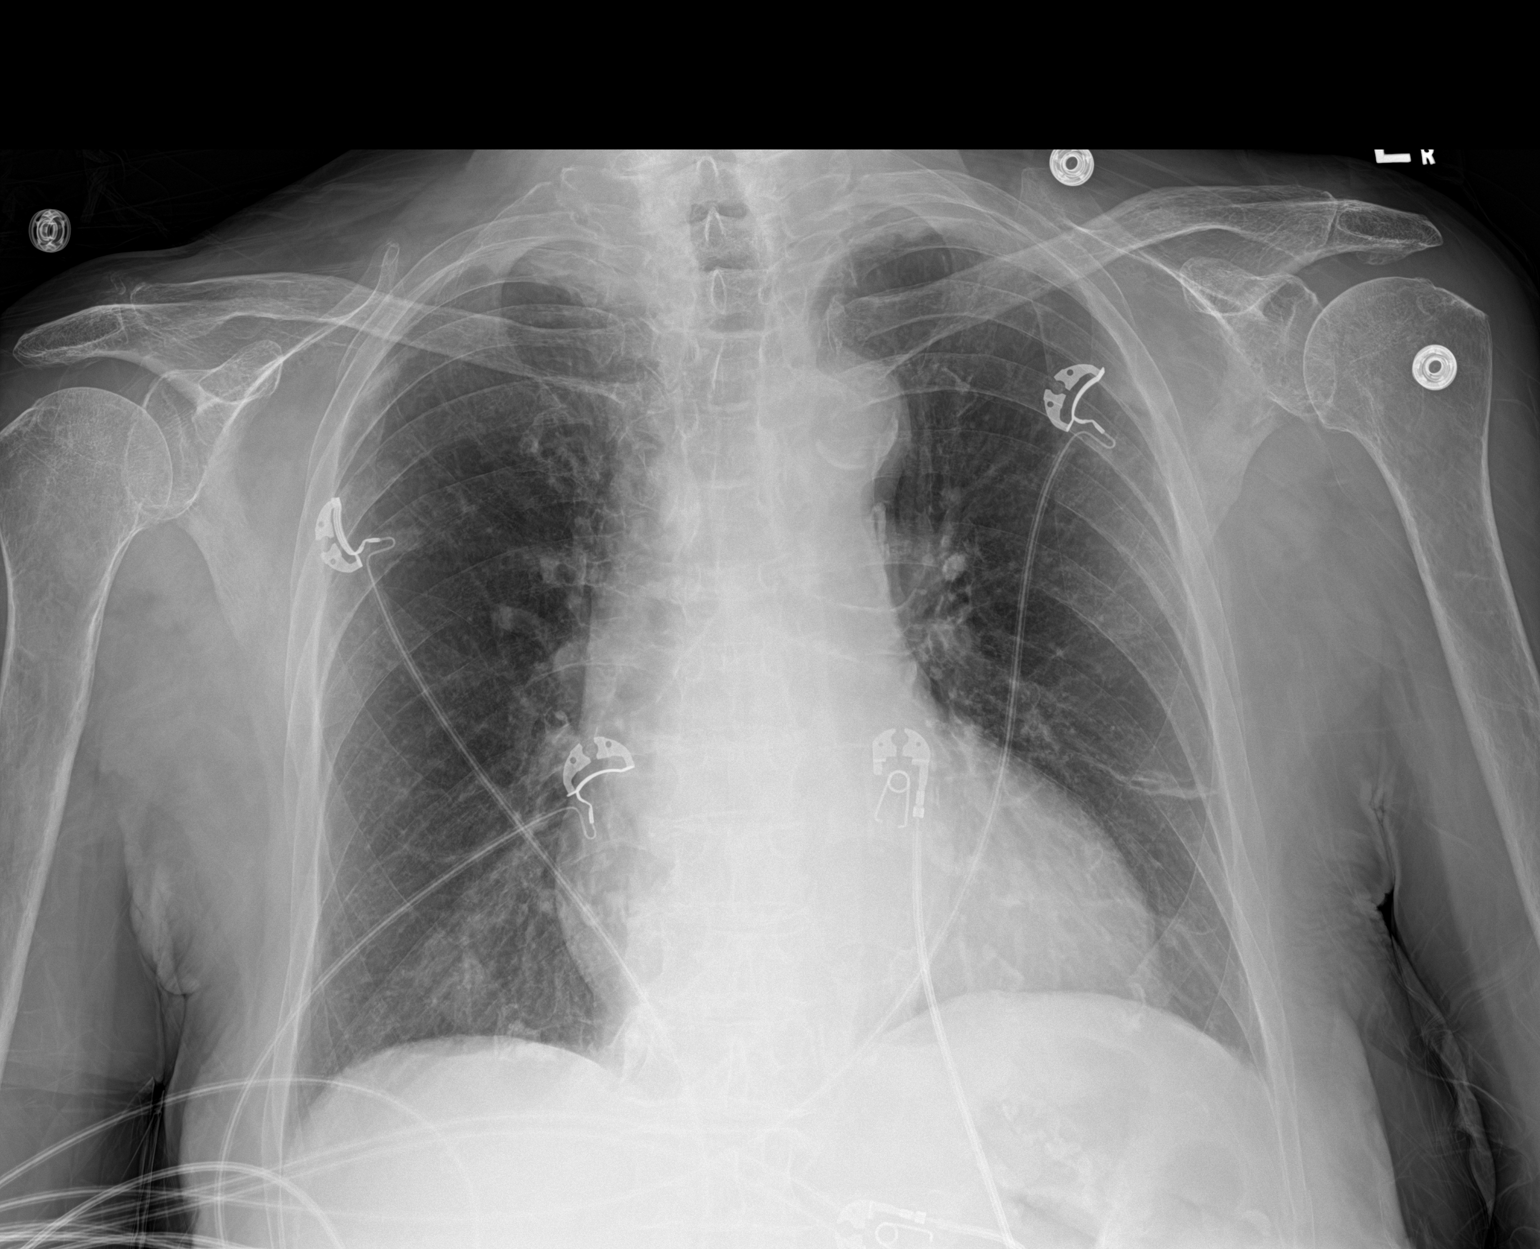

[1 of 1 positions shown; findings below may reference images not displayed]

FINDINGS: Enlargement of cardiac silhouette.

Mediastinal contours and pulmonary vascularity normal.

Atherosclerotic calcification of thoracic aorta.

Scarring at lingula.

Chronic bronchitic changes without infiltrate, pleural effusion or
pneumothorax.

Bones diffusely demineralized.
IMPRESSION: Enlargement of cardiac silhouette.

Chronic bronchitic changes with lingular scarring.

No acute abnormalities.

## 2019-11-27 IMAGING — CR DG RIBS W/ CHEST 3+V*L*
3 series · 3 of 3 positions shown · non-contrast
Comparison: 08/14/2017

CLINICAL DATA: Fall several days ago with left-sided chest pain,
initial encounter

EXAM:
LEFT RIBS AND CHEST - 3+ VIEW

[w chest pa]
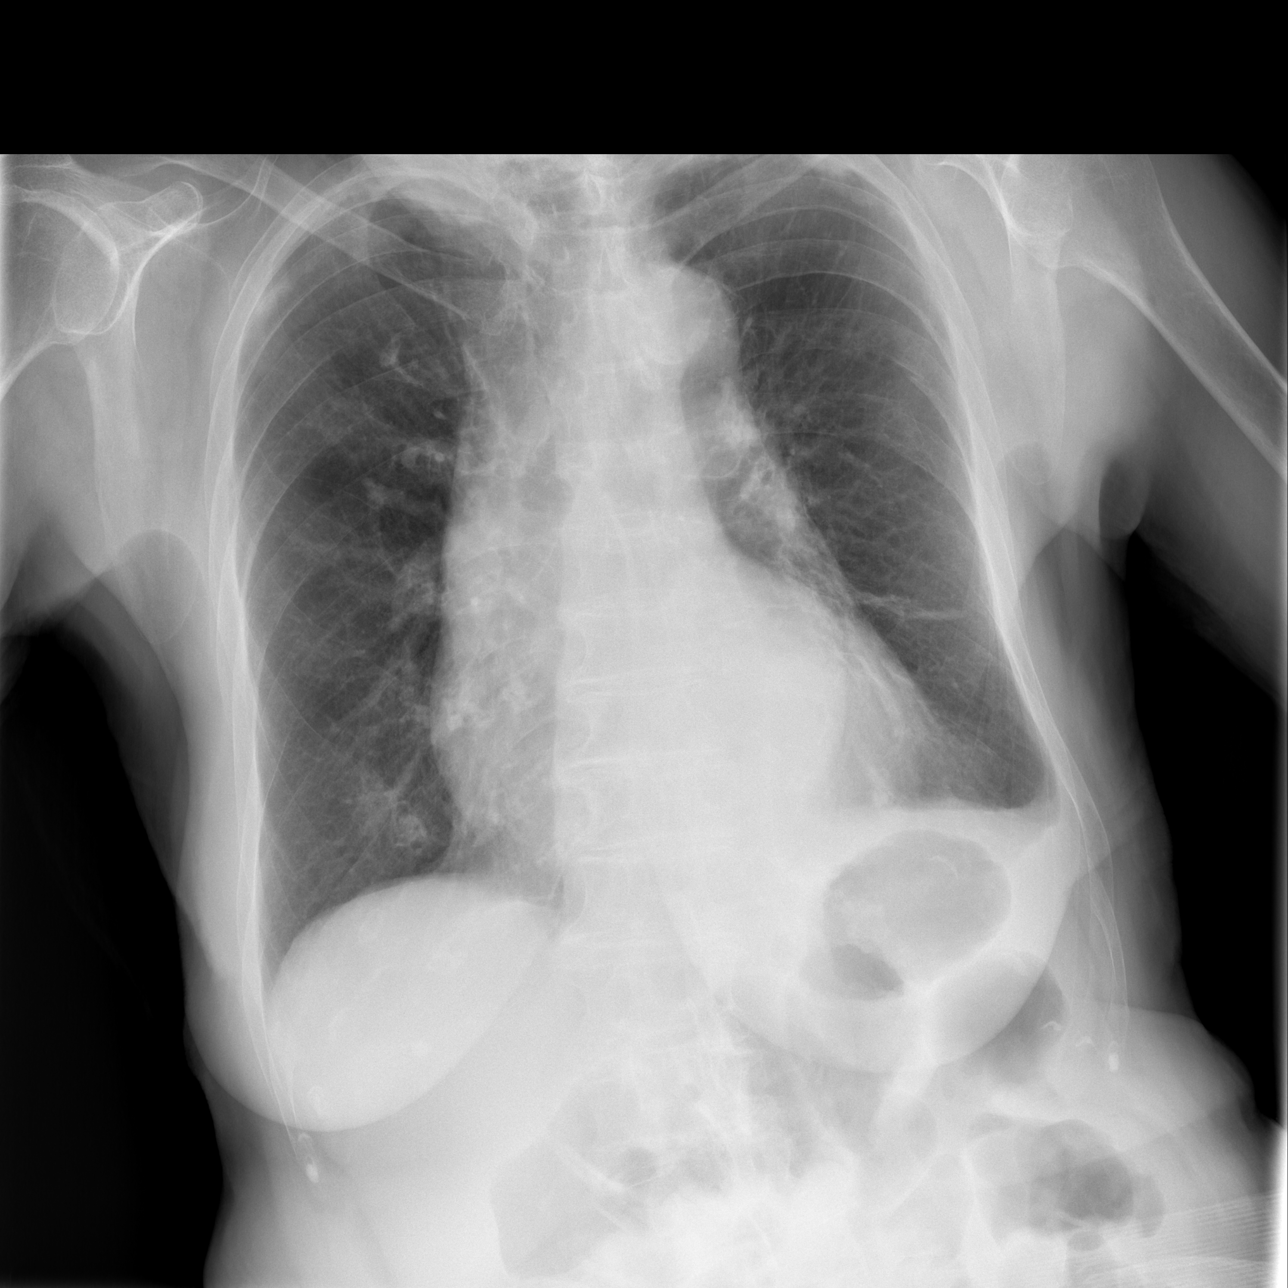

[w ribs ap/pa lower left *]
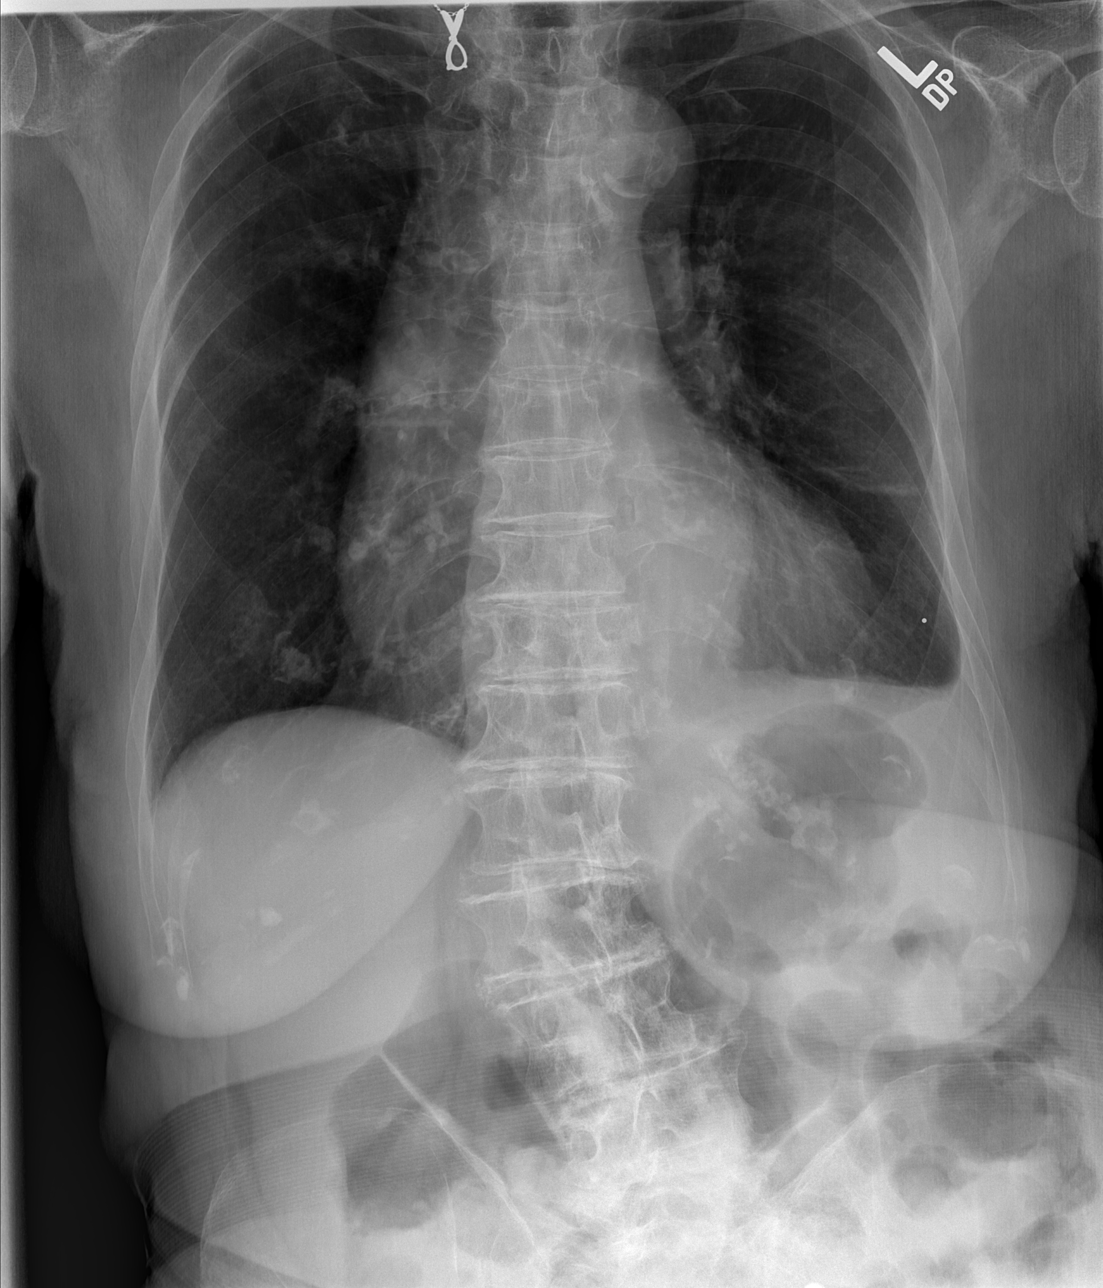

[w ribs oblique left *]
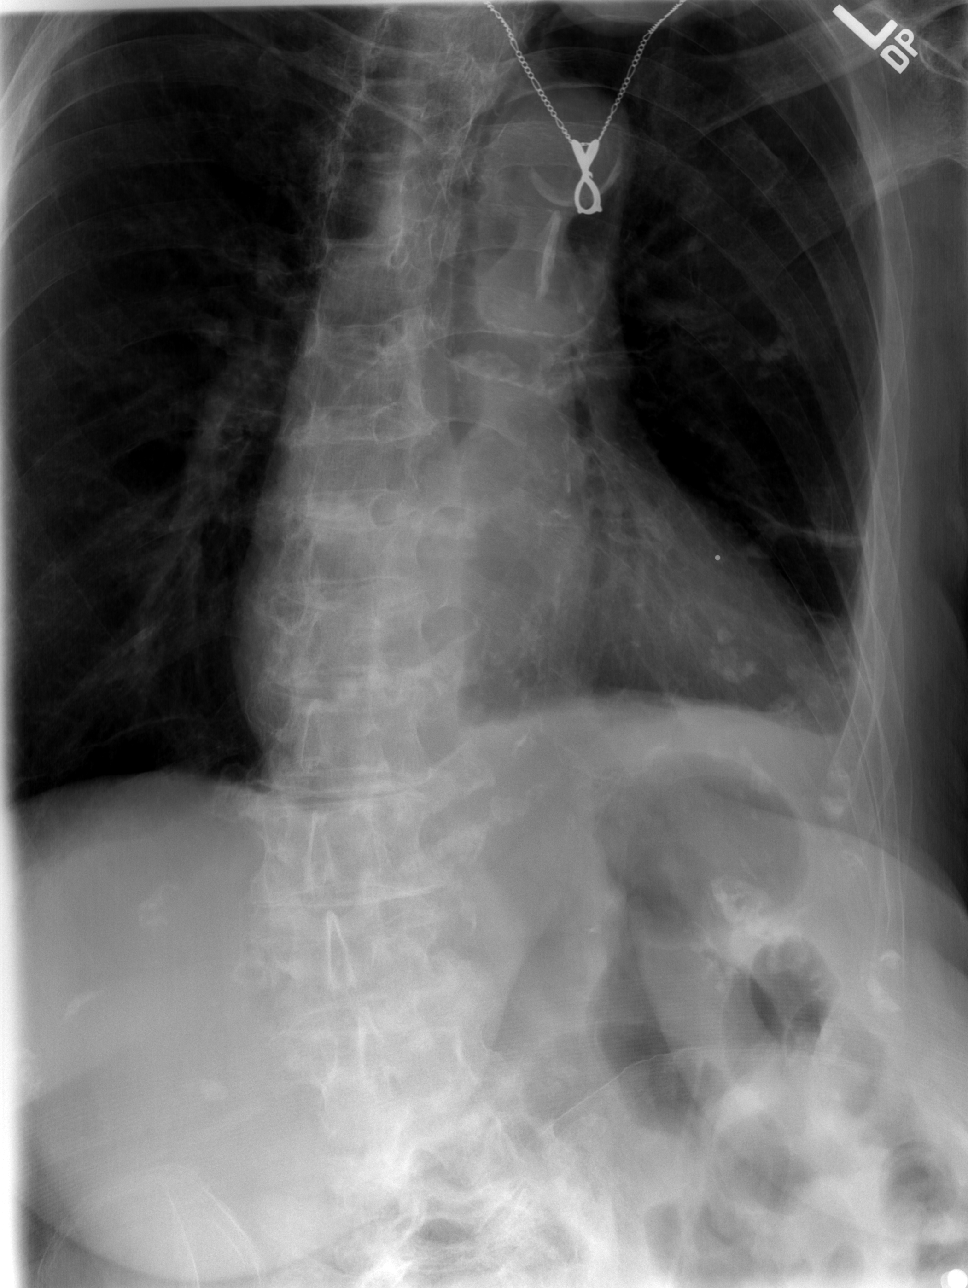

[3 of 3 positions shown; findings below may reference images not displayed]

FINDINGS: Cardiac shadow remains enlarged. Aortic calcifications are again
noted. Mild scarring is noted in the left mid lung as well as the
left apex. There is a minimally displaced fracture of the left sixth
and seventh ribs posteriorly. No underlying pneumothorax is seen. No
other focal abnormality is noted.
IMPRESSION: Fracture of the left sixth and seventh ribs posteriorly without
complicating factors. No other definitive fractures are noted.

## 2020-02-02 IMAGING — DX DG HIP (WITH OR WITHOUT PELVIS) 2-3V*R*
3 series · 3 of 3 positions shown · non-contrast
Comparison: 06/16/2017

CLINICAL DATA: Post unwitnessed fall.

EXAM:
DG HIP (WITH OR WITHOUT PELVIS) 2-3V RIGHT

[t pelvis ap]
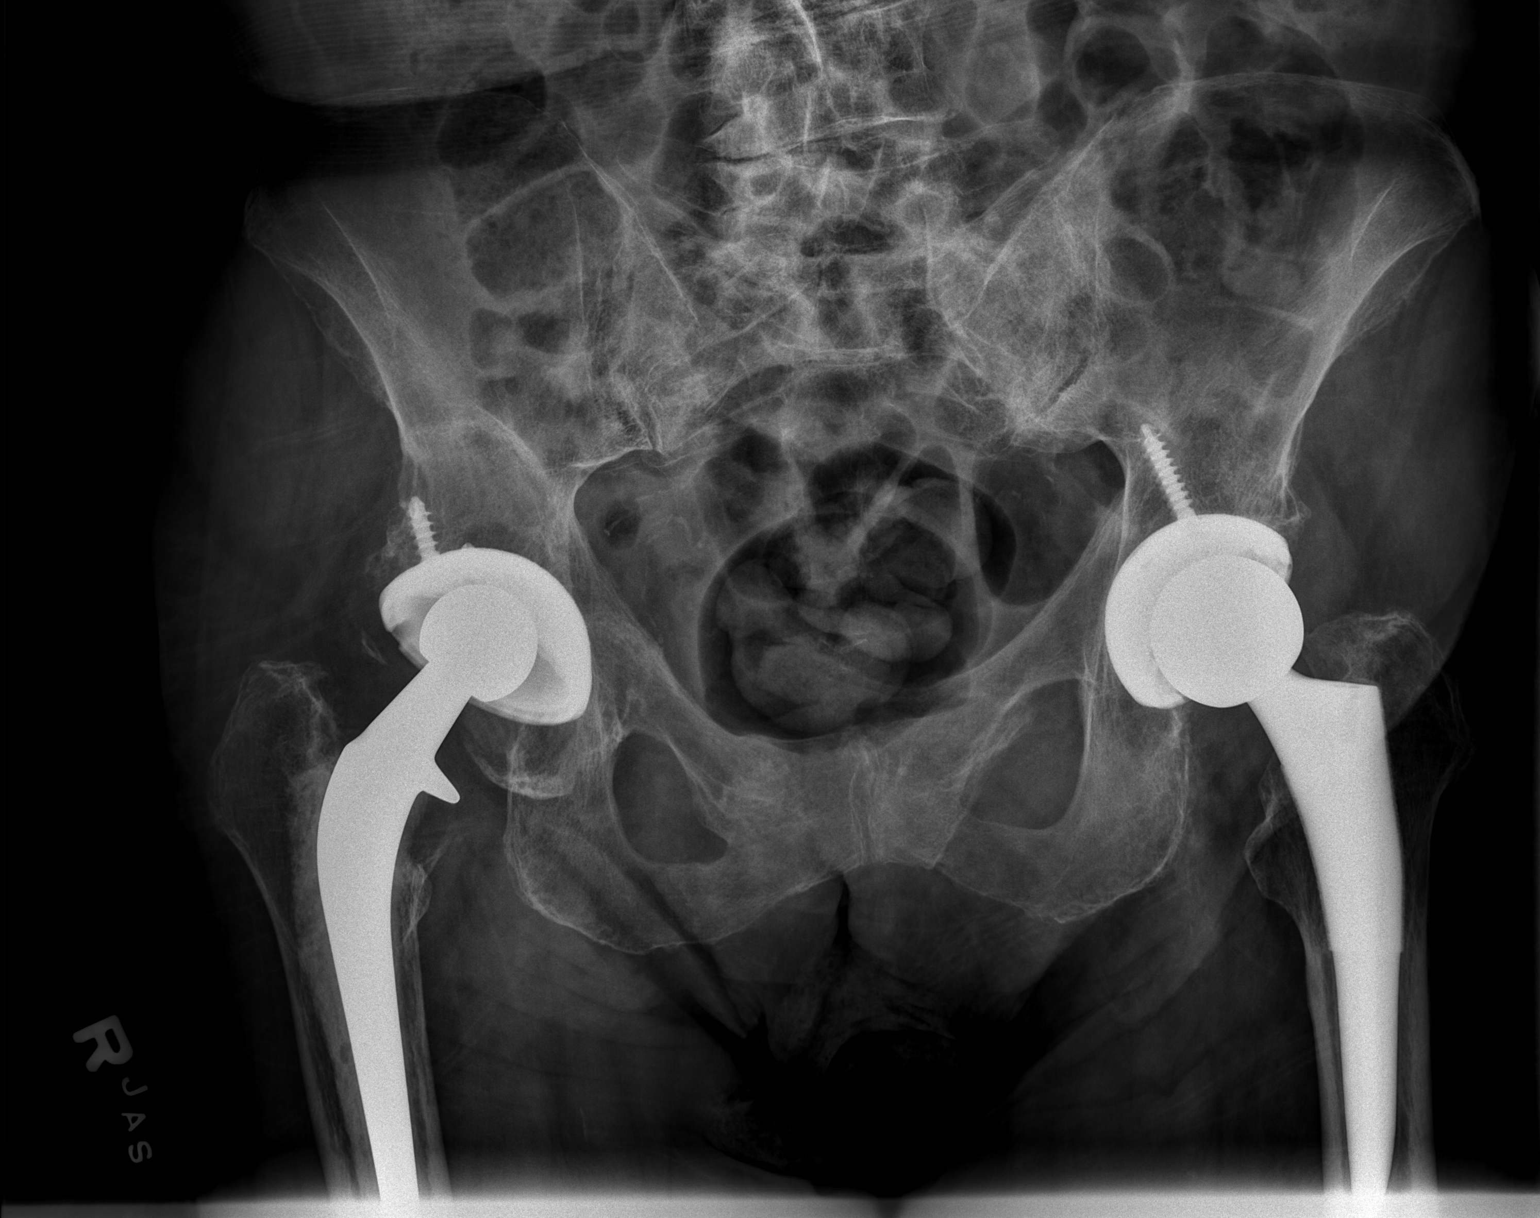

[t hip ap right]
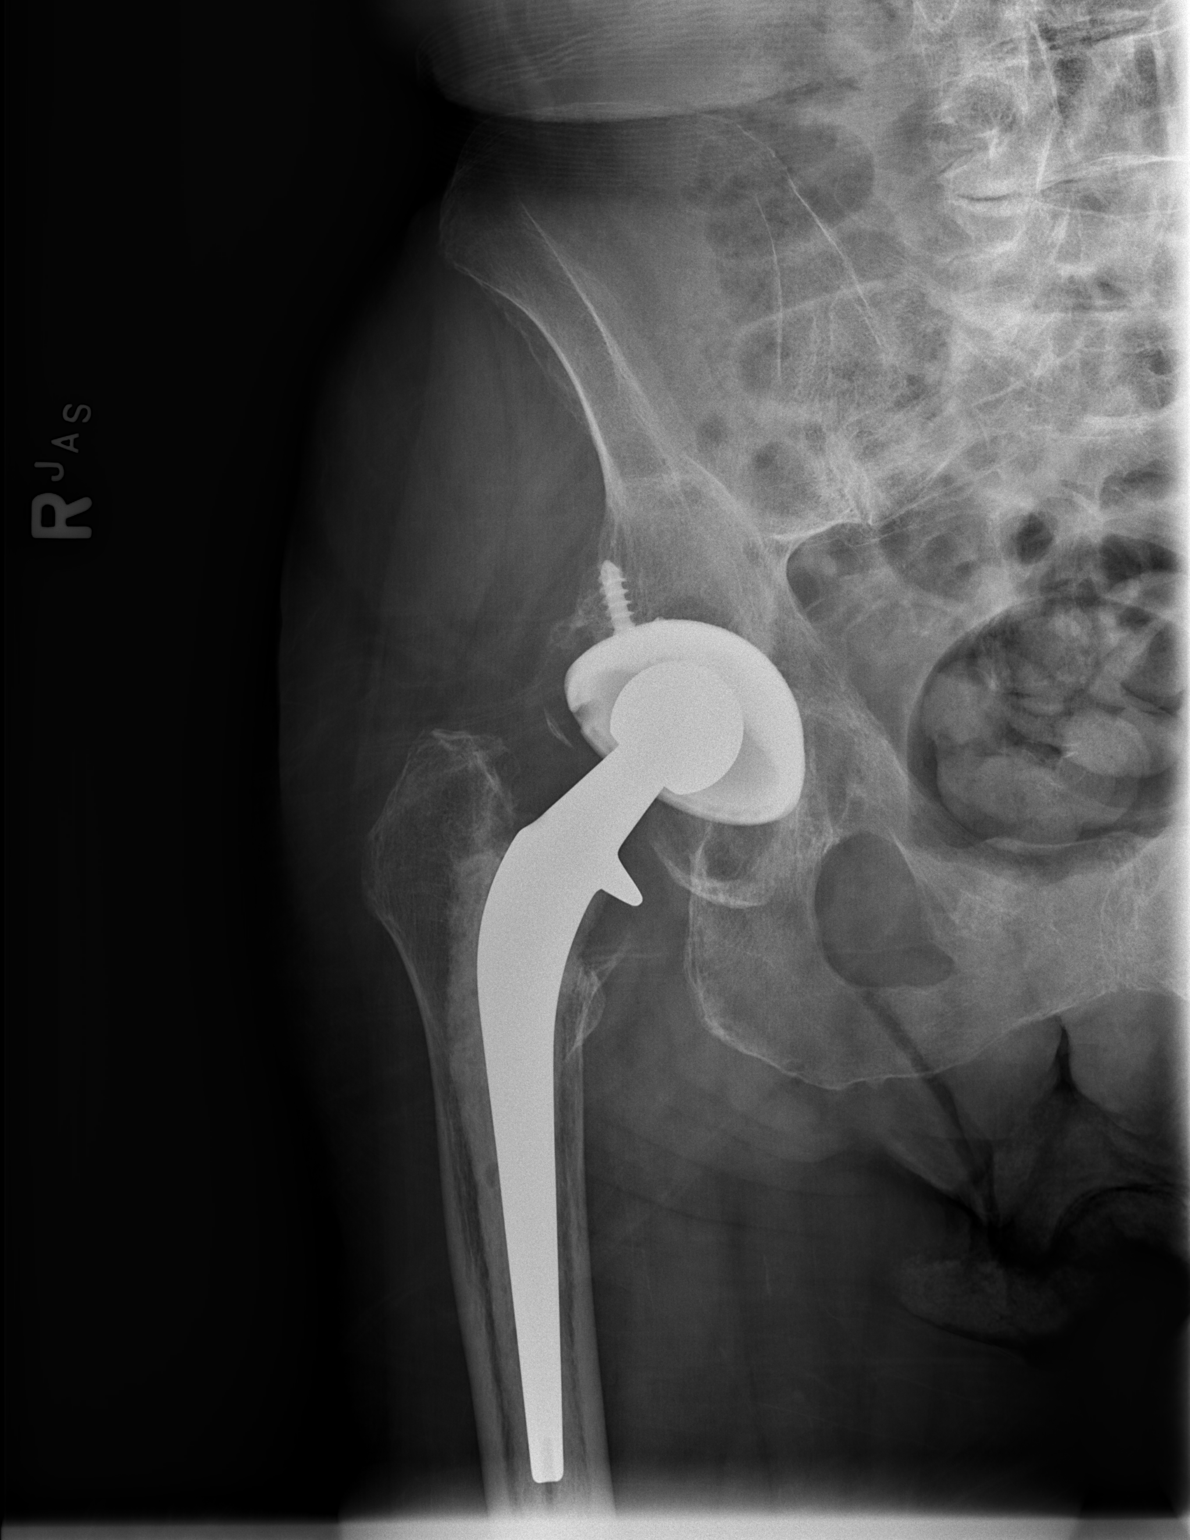

[t hip frog leg right]
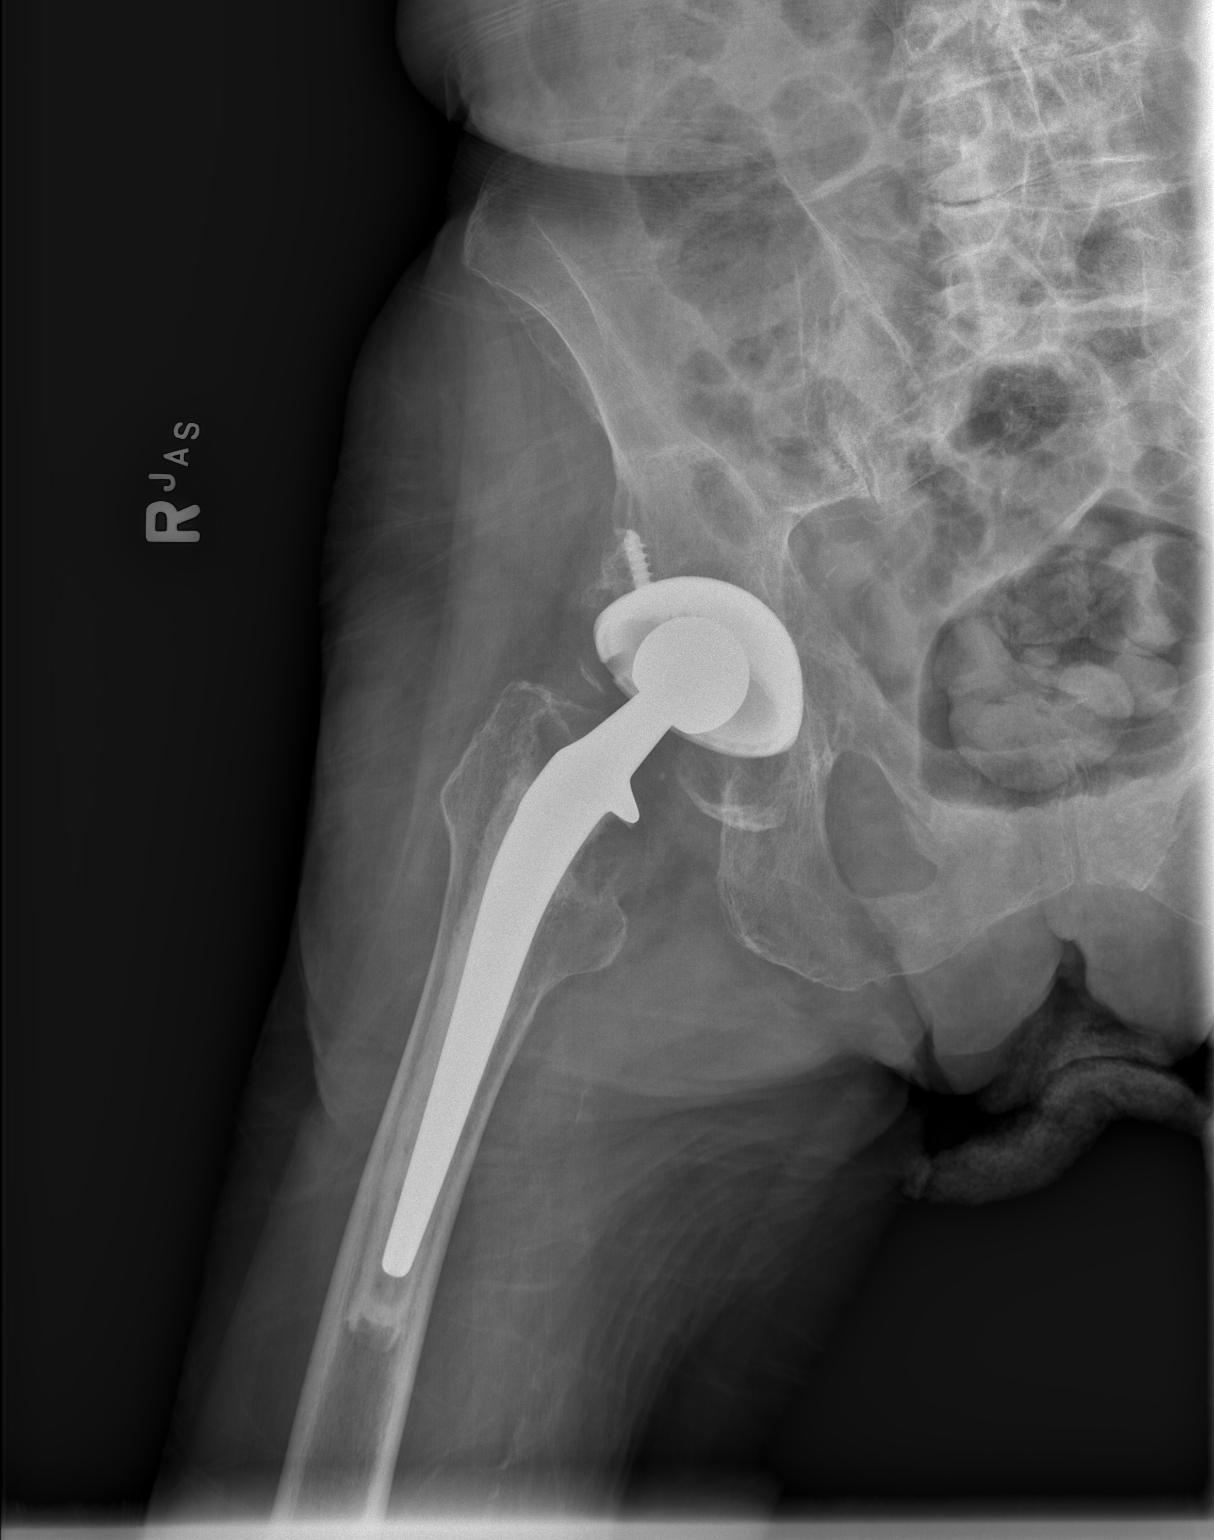

[3 of 3 positions shown; findings below may reference images not displayed]

FINDINGS: Post bilateral total hip arthroplasty. There is no evidence of
fracture. The orthopedic components are normally aligned. Healing or
healed bilateral superior pubic ramus fractures.
IMPRESSION: No evidence of acute fracture.

Healing or healed bilateral superior pubic rami fractures.
